# Patient Record
Sex: Female | Born: 1955 | Race: White | Hispanic: No | Smoking: Current some day smoker
Health system: Southern US, Community
[De-identification: ages and names within clinical notes are randomized; demographics above are authoritative.]

## PROBLEM LIST (undated history)

## (undated) DIAGNOSIS — N9089 Other specified noninflammatory disorders of vulva and perineum: Secondary | ICD-10-CM

## (undated) DIAGNOSIS — F1020 Alcohol dependence, uncomplicated: Secondary | ICD-10-CM

## (undated) DIAGNOSIS — I10 Essential (primary) hypertension: Secondary | ICD-10-CM

## (undated) DIAGNOSIS — M1990 Unspecified osteoarthritis, unspecified site: Secondary | ICD-10-CM

## (undated) DIAGNOSIS — D071 Carcinoma in situ of vulva: Secondary | ICD-10-CM

## (undated) HISTORY — DX: Unspecified osteoarthritis, unspecified site: M19.90

## (undated) HISTORY — DX: Carcinoma in situ of vulva: D07.1

## (undated) HISTORY — DX: Other specified noninflammatory disorders of vulva and perineum: N90.89

## (undated) HISTORY — PX: OTHER SURGICAL HISTORY: SHX169

## (undated) HISTORY — DX: Essential (primary) hypertension: I10

## (undated) HISTORY — PX: CHOLECYSTECTOMY: SHX55

---

## 1987-10-10 HISTORY — PX: ABDOMINAL HYSTERECTOMY: SHX81

## 1997-11-30 ENCOUNTER — Ambulatory Visit (HOSPITAL_COMMUNITY): Admission: RE | Admit: 1997-11-30 | Discharge: 1997-11-30 | Payer: Self-pay | Admitting: Obstetrics & Gynecology

## 1998-02-09 ENCOUNTER — Other Ambulatory Visit: Admission: RE | Admit: 1998-02-09 | Discharge: 1998-02-09 | Payer: Self-pay | Admitting: Obstetrics & Gynecology

## 1998-03-22 ENCOUNTER — Other Ambulatory Visit: Admission: RE | Admit: 1998-03-22 | Discharge: 1998-03-22 | Payer: Self-pay | Admitting: Obstetrics & Gynecology

## 1999-02-17 ENCOUNTER — Other Ambulatory Visit: Admission: RE | Admit: 1999-02-17 | Discharge: 1999-02-17 | Payer: Self-pay | Admitting: Obstetrics & Gynecology

## 2000-04-02 ENCOUNTER — Other Ambulatory Visit: Admission: RE | Admit: 2000-04-02 | Discharge: 2000-04-02 | Payer: Self-pay | Admitting: Obstetrics & Gynecology

## 2001-03-27 ENCOUNTER — Ambulatory Visit (HOSPITAL_COMMUNITY): Admission: RE | Admit: 2001-03-27 | Discharge: 2001-03-27 | Payer: Self-pay | Admitting: Gastroenterology

## 2001-07-04 ENCOUNTER — Other Ambulatory Visit: Admission: RE | Admit: 2001-07-04 | Discharge: 2001-07-04 | Payer: Self-pay | Admitting: Obstetrics & Gynecology

## 2002-01-17 ENCOUNTER — Ambulatory Visit (HOSPITAL_COMMUNITY): Admission: RE | Admit: 2002-01-17 | Discharge: 2002-01-17 | Payer: Self-pay | Admitting: Urology

## 2002-01-17 ENCOUNTER — Encounter: Payer: Self-pay | Admitting: Urology

## 2002-01-27 ENCOUNTER — Encounter: Payer: Self-pay | Admitting: Urology

## 2002-01-27 ENCOUNTER — Ambulatory Visit (HOSPITAL_COMMUNITY): Admission: RE | Admit: 2002-01-27 | Discharge: 2002-01-27 | Payer: Self-pay | Admitting: Urology

## 2002-08-27 ENCOUNTER — Other Ambulatory Visit: Admission: RE | Admit: 2002-08-27 | Discharge: 2002-08-27 | Payer: Self-pay | Admitting: Obstetrics & Gynecology

## 2003-06-29 ENCOUNTER — Ambulatory Visit (HOSPITAL_COMMUNITY): Admission: RE | Admit: 2003-06-29 | Discharge: 2003-06-29 | Payer: Self-pay | Admitting: Pediatrics

## 2003-06-29 ENCOUNTER — Encounter: Payer: Self-pay | Admitting: Pediatrics

## 2003-09-15 ENCOUNTER — Other Ambulatory Visit: Admission: RE | Admit: 2003-09-15 | Discharge: 2003-09-15 | Payer: Self-pay | Admitting: Obstetrics & Gynecology

## 2004-10-14 ENCOUNTER — Other Ambulatory Visit: Admission: RE | Admit: 2004-10-14 | Discharge: 2004-10-14 | Payer: Self-pay | Admitting: Obstetrics & Gynecology

## 2005-11-16 ENCOUNTER — Other Ambulatory Visit: Admission: RE | Admit: 2005-11-16 | Discharge: 2005-11-16 | Payer: Self-pay | Admitting: Obstetrics & Gynecology

## 2006-02-16 ENCOUNTER — Ambulatory Visit (HOSPITAL_COMMUNITY): Admission: RE | Admit: 2006-02-16 | Discharge: 2006-02-16 | Payer: Self-pay | Admitting: Pediatrics

## 2006-11-29 ENCOUNTER — Ambulatory Visit (HOSPITAL_COMMUNITY): Admission: RE | Admit: 2006-11-29 | Discharge: 2006-11-29 | Payer: Self-pay | Admitting: Pediatrics

## 2007-05-08 IMAGING — CR DG CHEST 2V
2 series · 2 of 2 positions shown · non-contrast
Comparison: none

CLINICAL DATA: Cough, short of breath, smoking history.
 CHEST ? 2 VIEW:

[view not recorded (1 of 2)]
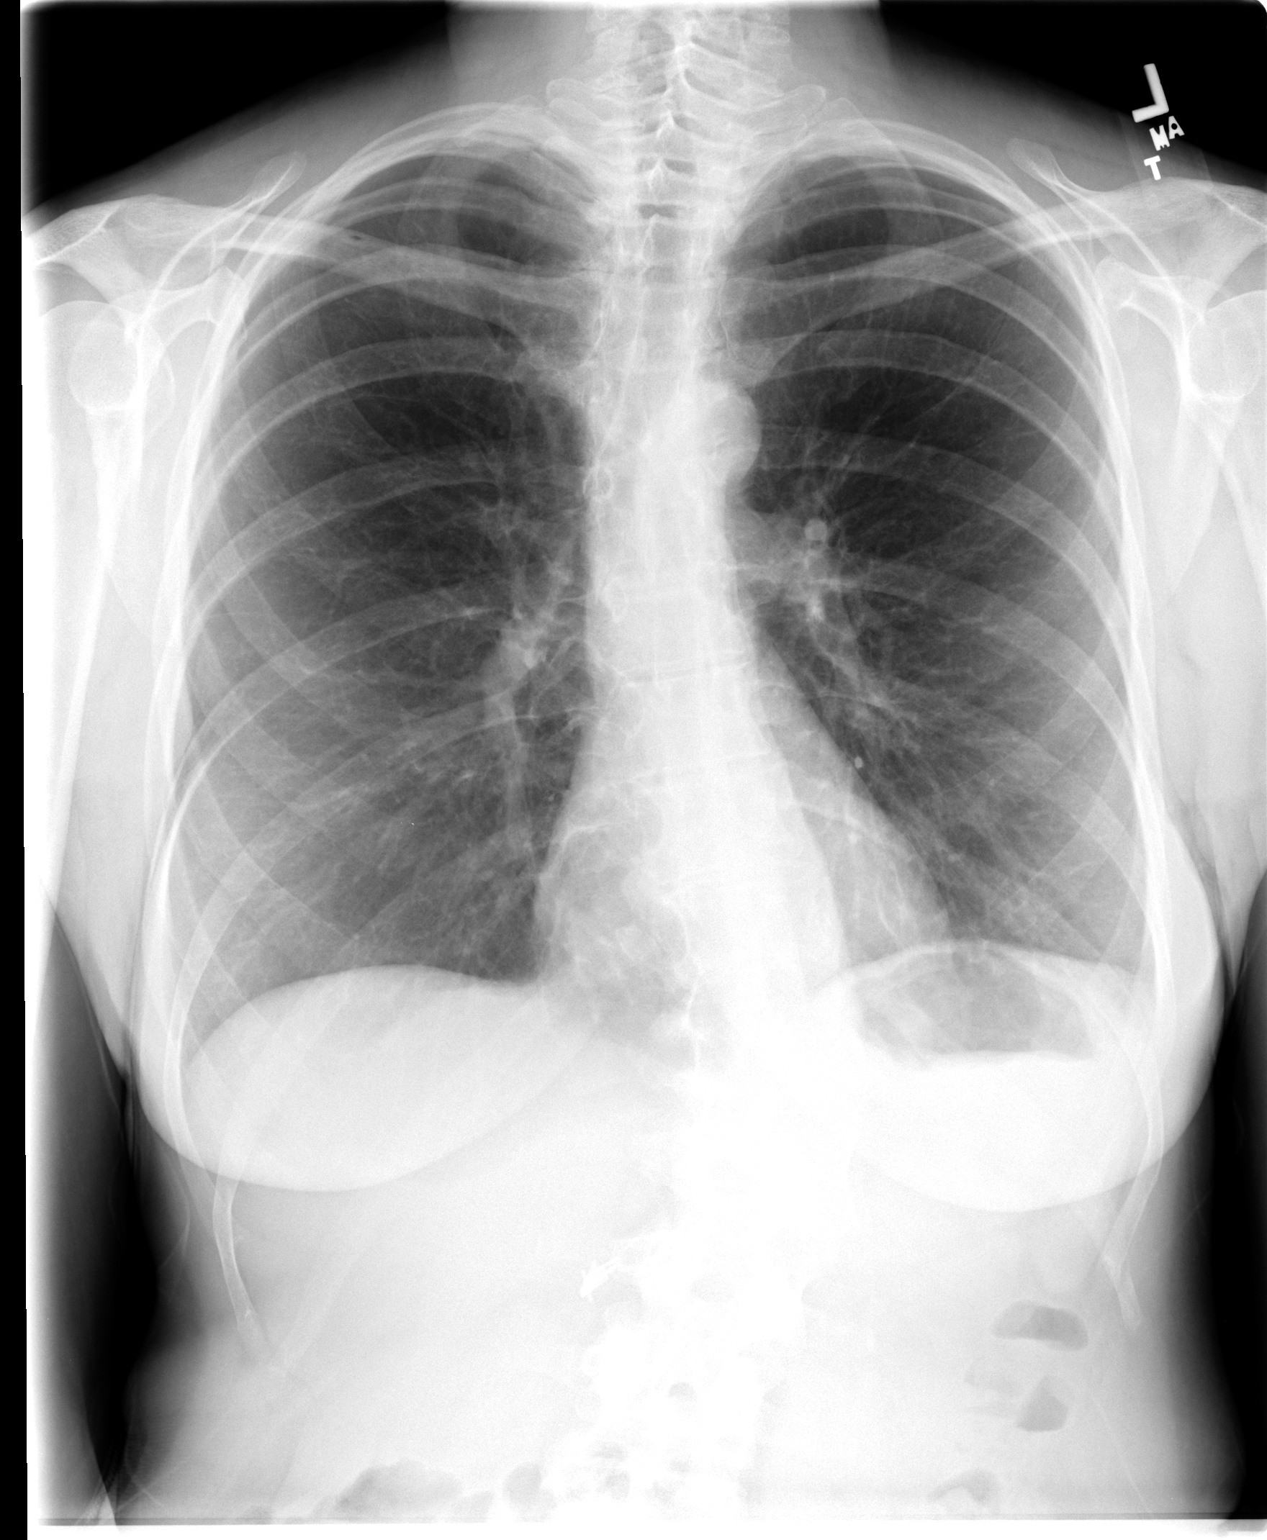

[view not recorded (2 of 2)]
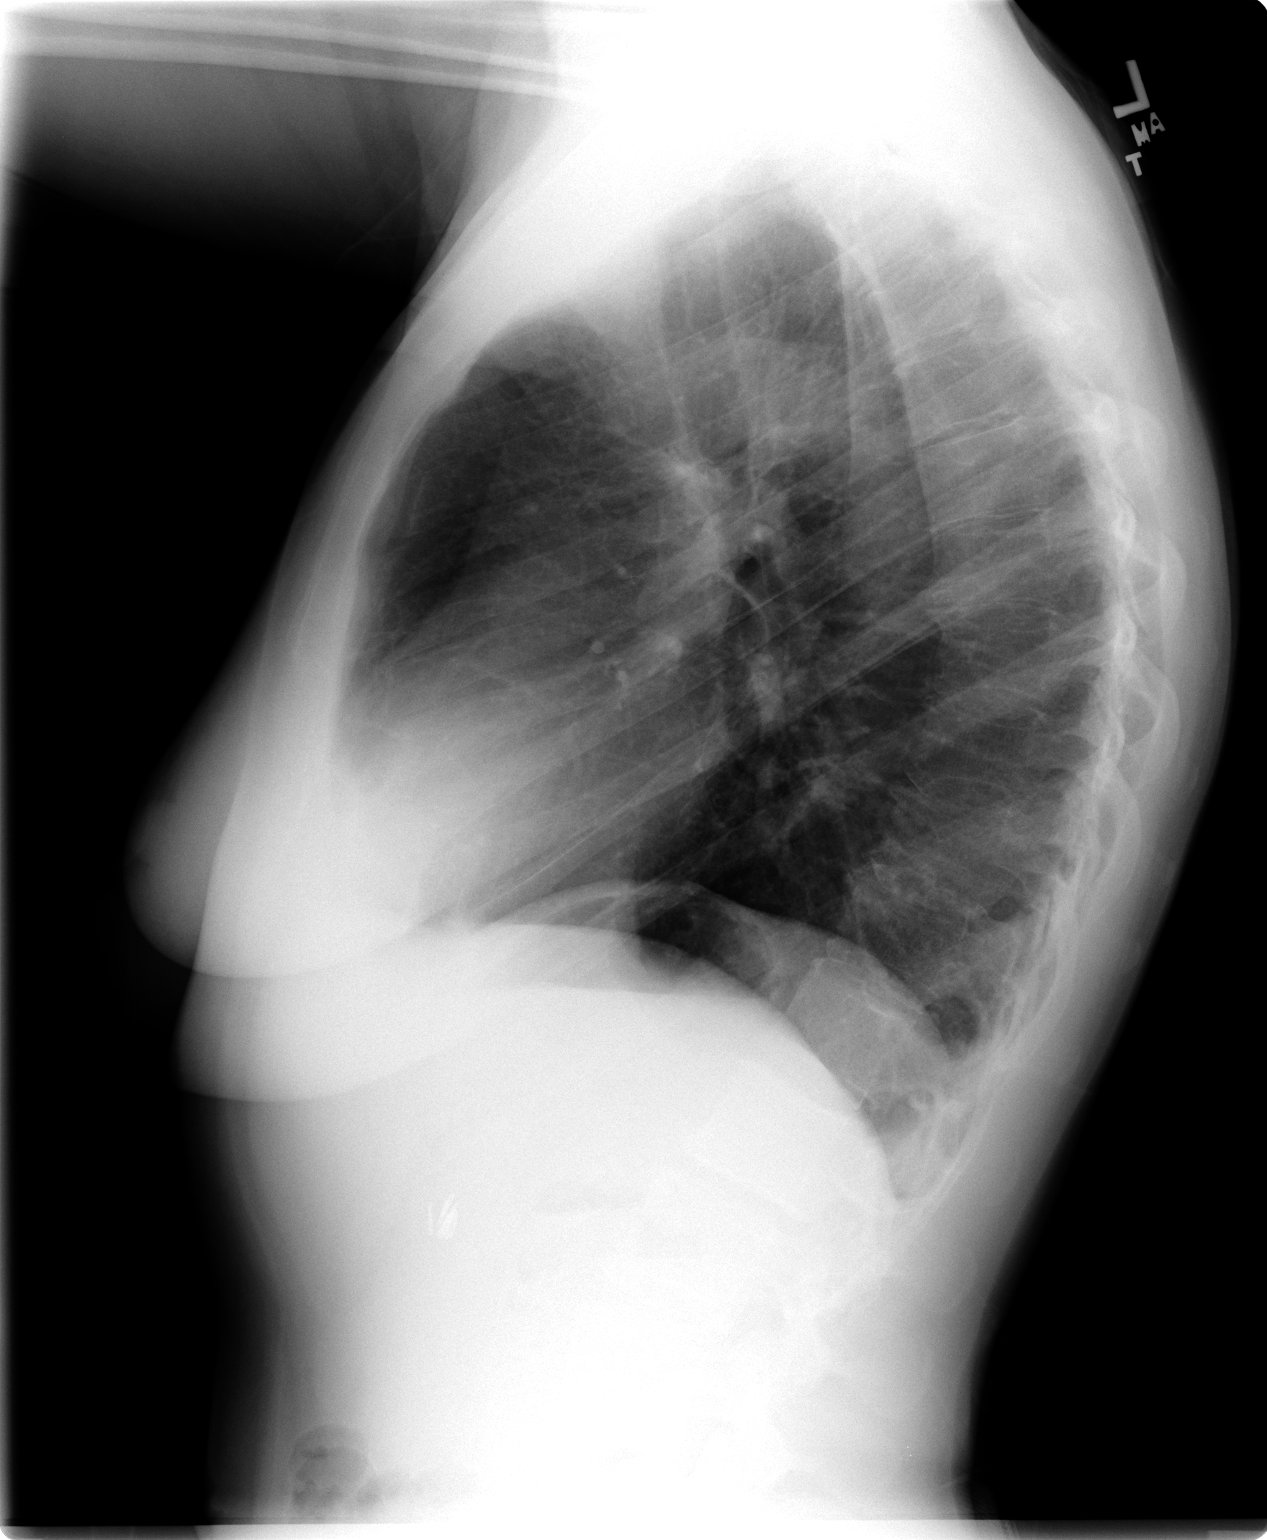

[2 of 2 positions shown; findings below may reference images not displayed]

FINDINGS: Two views of the chest show the lungs to be hyperaerated consistent with COPD.  No active infiltrate or effusion is seen.  The heart is within normal limits in size.  There is thoracolumbar scoliosis present.
IMPRESSION: COPD.  Thoracolumbar scoliosis.  No active lung disease.

## 2008-11-24 ENCOUNTER — Ambulatory Visit: Payer: Self-pay | Admitting: Gastroenterology

## 2008-11-30 ENCOUNTER — Ambulatory Visit: Payer: Self-pay | Admitting: Gastroenterology

## 2008-11-30 ENCOUNTER — Ambulatory Visit (HOSPITAL_COMMUNITY): Admission: RE | Admit: 2008-11-30 | Discharge: 2008-11-30 | Payer: Self-pay | Admitting: Gastroenterology

## 2008-11-30 ENCOUNTER — Encounter: Payer: Self-pay | Admitting: Gastroenterology

## 2008-12-01 ENCOUNTER — Telehealth (INDEPENDENT_AMBULATORY_CARE_PROVIDER_SITE_OTHER): Payer: Self-pay

## 2008-12-11 ENCOUNTER — Encounter: Payer: Self-pay | Admitting: Gastroenterology

## 2009-01-04 DIAGNOSIS — C439 Malignant melanoma of skin, unspecified: Secondary | ICD-10-CM | POA: Insufficient documentation

## 2009-01-04 DIAGNOSIS — F329 Major depressive disorder, single episode, unspecified: Secondary | ICD-10-CM

## 2009-01-04 DIAGNOSIS — Z8719 Personal history of other diseases of the digestive system: Secondary | ICD-10-CM

## 2009-01-04 DIAGNOSIS — F411 Generalized anxiety disorder: Secondary | ICD-10-CM | POA: Insufficient documentation

## 2009-01-04 DIAGNOSIS — K219 Gastro-esophageal reflux disease without esophagitis: Secondary | ICD-10-CM

## 2009-01-04 DIAGNOSIS — F17200 Nicotine dependence, unspecified, uncomplicated: Secondary | ICD-10-CM | POA: Insufficient documentation

## 2009-01-04 DIAGNOSIS — M810 Age-related osteoporosis without current pathological fracture: Secondary | ICD-10-CM | POA: Insufficient documentation

## 2009-01-04 DIAGNOSIS — M129 Arthropathy, unspecified: Secondary | ICD-10-CM | POA: Insufficient documentation

## 2009-01-04 DIAGNOSIS — I10 Essential (primary) hypertension: Secondary | ICD-10-CM | POA: Insufficient documentation

## 2009-01-05 ENCOUNTER — Ambulatory Visit: Payer: Self-pay | Admitting: Gastroenterology

## 2009-01-05 DIAGNOSIS — K222 Esophageal obstruction: Secondary | ICD-10-CM

## 2009-01-05 DIAGNOSIS — R110 Nausea: Secondary | ICD-10-CM

## 2009-01-05 DIAGNOSIS — R1013 Epigastric pain: Secondary | ICD-10-CM

## 2009-01-05 DIAGNOSIS — K5909 Other constipation: Secondary | ICD-10-CM

## 2009-02-25 ENCOUNTER — Ambulatory Visit (HOSPITAL_COMMUNITY): Admission: RE | Admit: 2009-02-25 | Discharge: 2009-02-25 | Payer: Self-pay | Admitting: Specialist

## 2009-03-22 ENCOUNTER — Ambulatory Visit (HOSPITAL_COMMUNITY): Admission: RE | Admit: 2009-03-22 | Discharge: 2009-03-22 | Payer: Self-pay | Admitting: Pediatrics

## 2009-06-09 ENCOUNTER — Ambulatory Visit: Admission: RE | Admit: 2009-06-09 | Discharge: 2009-06-09 | Payer: Self-pay | Admitting: Gynecologic Oncology

## 2009-07-13 ENCOUNTER — Ambulatory Visit (HOSPITAL_COMMUNITY): Admission: RE | Admit: 2009-07-13 | Discharge: 2009-07-13 | Payer: Self-pay | Admitting: Gynecologic Oncology

## 2009-08-25 ENCOUNTER — Ambulatory Visit: Admission: RE | Admit: 2009-08-25 | Discharge: 2009-08-25 | Payer: Self-pay | Admitting: Gynecologic Oncology

## 2009-11-24 ENCOUNTER — Ambulatory Visit: Admission: RE | Admit: 2009-11-24 | Discharge: 2009-11-24 | Payer: Self-pay | Admitting: Gynecologic Oncology

## 2010-06-22 ENCOUNTER — Ambulatory Visit: Admission: RE | Admit: 2010-06-22 | Discharge: 2010-06-22 | Payer: Self-pay | Admitting: Gynecologic Oncology

## 2010-11-02 ENCOUNTER — Ambulatory Visit
Admission: RE | Admit: 2010-11-02 | Discharge: 2010-11-02 | Payer: Self-pay | Source: Home / Self Care | Attending: Gynecologic Oncology | Admitting: Gynecologic Oncology

## 2010-11-07 NOTE — Consult Note (Signed)
NAME:  Carol Rowe, Carol Rowe NO.:  [id number]  MEDICAL RECORD NO.:  [id number]          PATIENT TYPE:  OUT  LOCATION:  GYN                          FACILITY:  [facility]  PHYSICIAN:  Paola A. 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  There was a slightly pale area near the posterior fourchette that we put acetic acid on it, it did not light up. She was dispositioned to close followup.  She was seen by Dr. Jennette Kettle in December and an area was noted.  She underwent a wide local excision of the vulva in the office on December 22.  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  She comes in today for followup.  She is otherwise doing quite well and denies any complaints.  PHYSICAL EXAMINATION:  VITAL SIGNS:  Weight 126 pounds, blood pressure 118/60, pulse 78. GENERAL:  Well-nourished, well-developed, very pleasant female, in no acute distress. PELVIC:  External genitalia in the right labia majora inferiorly, there is evidence of a suture site.  There is approximately a 5 x 5 cm area of white hyperkeratotic lesion that was noted.  After discussion, the patient would like for [place] to excise that area today rather than having to do something in the operating room at a later date.  After obtaining verbal informed consent from the patient, the area was cleansed with Betadine cleaning solution.  A 1 cc of 1% lidocaine was injected into the area.  Using 2 passes of incisional biopsy forceps, the area was removed in its entirety.  The 2 sutures of 4-0 Vicryl on an SH needle were used to close the area.  She tolerated the procedure well.  There was no active bleeding.  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  PLAN:  I will followup results of these biopsies from today and I will call her with the results.  She will return to see [place] in 3 months for surveillance but she will see me in 2 weeks for a brief postop check.     Paola A. Duard Brady, MD     PAG/MEDQ  D:  11/02/2010  T:  11/02/2010  Job:  [id number]  cc:   Freddy Finner, M.D. Fax: [phone]  Telford Nab, R.N. [address] [place], [place] [address]  Francoise Schaumann. Halm, DO, FAAP Fax: [phone]  A. Jeralene Peters., M.D. Fax: [phone]  Electronically Signed by Cleda Mccreedy MD on 11/07/2010 02:24:16 PM

## 2010-11-16 ENCOUNTER — Ambulatory Visit: Payer: BC Managed Care – PPO | Attending: Gynecologic Oncology | Admitting: Gynecologic Oncology

## 2010-11-16 DIAGNOSIS — D071 Carcinoma in situ of vulva: Secondary | ICD-10-CM | POA: Insufficient documentation

## 2010-11-18 NOTE — Consult Note (Signed)
NAME:  Carol Rowe, EHMKE NO.:  [id number]  MEDICAL RECORD NO.:  [id number]          PATIENT TYPE:  OUT  LOCATION:  GYN                          FACILITY:  [facility]  PHYSICIAN:  Jersey Espinoza A. Duard Brady, MD    DATE OF BIRTH:  09-27-1956  DATE OF CONSULTATION:  11/16/2010 DATE OF DISCHARGE:  11/02/2010                                CONSULTATION   HISTORY OF PRESENT ILLNESS:  Ms. Carol Rowe is a 55 year old who I saw January 25.  She had a wide local excision of the vulva in the office by Dr. Jennette Kettle on January 25 that revealed VIN III with a positive lateral margin.  When I saw her January 25, there was a 5 x 5 mm area of white hyperkeratotic lesion that was noted.  She wanted the area resected in the office.  This was done without difficulty.  It revealed VIN III with positive margin.  She comes in today for a postoperative check.  She did quite well.  She did require the Percocet for pain control.  She was recently diagnosed with bronchitis and is currently on cephalexin.  She is otherwise doing well.  PHYSICAL EXAMINATION:  VITAL SIGNS:  Weight 124 pounds, blood pressure 118/70, height 5 feet 2, pulse 64, respirations 16, temperature 97.8. GENERAL:  A well-nourished, well-developed female in no acute distress. PELVIC:  External genitalia shows a well-healed surgical site.  The sutures have fallen out.  There is no residual hyperkeratosis.  ASSESSMENT:  A 55 year old with vulval intraepithelial neoplasia III.  PLAN:  Return to see [place] in 3 months for surveillance.     Kenlee Vogt A. Duard Brady, MD     PAG/MEDQ  D:  11/16/2010  T:  11/16/2010  Job:  [id number]  cc:   Telford Nab, R.N. [address] [place], [place] [address]  W. Varney Baas, M.D. Fax: [phone]  Francoise Schaumann. Benjamine Mola, FAAP Fax: [phone]  Electronically Signed by Cleda Mccreedy MD on 11/18/2010 10:03:23 AM

## 2011-01-05 ENCOUNTER — Ambulatory Visit (INDEPENDENT_AMBULATORY_CARE_PROVIDER_SITE_OTHER): Payer: BC Managed Care – PPO | Admitting: Otolaryngology

## 2011-01-12 ENCOUNTER — Ambulatory Visit (INDEPENDENT_AMBULATORY_CARE_PROVIDER_SITE_OTHER): Payer: BC Managed Care – PPO | Admitting: Otolaryngology

## 2011-01-12 DIAGNOSIS — J343 Hypertrophy of nasal turbinates: Secondary | ICD-10-CM

## 2011-01-12 DIAGNOSIS — J310 Chronic rhinitis: Secondary | ICD-10-CM

## 2011-01-12 LAB — COMPREHENSIVE METABOLIC PANEL
ALT: 18 U/L (ref 0–35)
Albumin: 3.7 g/dL (ref 3.5–5.2)
Alkaline Phosphatase: 83 U/L (ref 39–117)
Chloride: 105 mEq/L (ref 96–112)
Glucose, Bld: 107 mg/dL — ABNORMAL HIGH (ref 70–99)
Potassium: 3.1 mEq/L — ABNORMAL LOW (ref 3.5–5.1)
Sodium: 137 mEq/L (ref 135–145)
Total Bilirubin: 0.4 mg/dL (ref 0.3–1.2)
Total Protein: 6.2 g/dL (ref 6.0–8.3)

## 2011-01-12 LAB — CBC
Hemoglobin: 13.7 g/dL (ref 12.0–15.0)
Platelets: 329 10*3/uL (ref 150–400)
RDW: 12 % (ref 11.5–15.5)
WBC: 12.3 10*3/uL — ABNORMAL HIGH (ref 4.0–10.5)

## 2011-02-21 NOTE — Op Note (Signed)
NAME:  VALENDA, MCLEARY               ACCOUNT NO.:  [id number]   MEDICAL RECORD NO.:  [id number]          PATIENT TYPE:  AMB   LOCATION:  DAY                           FACILITY:  [facility]   PHYSICIAN:  Kassie Mends, M.D.      DATE OF BIRTH:  02-Nov-1955   DATE OF PROCEDURE:  11/30/2008  DATE OF DISCHARGE:                                PROCEDURE NOTE   REFERRING PHYSICIAN:  Francoise Schaumann. Halm, DO, FAAP   PROCEDURE:  1. Colonoscopy.  2. Esophagogastroduodenoscopy with cold forceps biopsy and Savary      dilation to 17 mm.   INDICATION FOR EXAM:  Ms. Hade is a 55 year old female who presented  with abdominal pain and dysphagia. Her father had colon cancer at age  less than 52.   FINDINGS:  1. Tortuous colon.  The patient required abdominal pressure in the      supine position in order to successfully intubate the cecum.  2. Pancolonic diverticulosis, most pronounced in the left colon.  3. Moderate internal hemorrhoids.  Otherwise, no evidence of polyps,      masses, inflammatory change or AVMs.  4. Distal Schatzki ring.  Otherwise no evidence of erosions,      ulceration, Barrett's or mass in the esophagus.  The distal      esophagus was dilated to 17 mm.  A 17-mm dilator passed with      moderate resistance.  5. Patchy erythema in the antrum.  Biopsies obtained via cold forceps      to evaluate for H. pylori gastritis or eosinophilic gastritis.  6. Normal duodenal bulb in the second portion of the duodenum.   DIAGNOSES:  1. Mild-to-moderate diverticulosis.  2. Moderate internal hemorrhoids.  3. Schatzki ring, dilated.  4. Mild gastritis.   RECOMMENDATIONS:  1. Will call Ms. Scheller with the results of her biopsies.  She has a      follow up appointment on January 05, 2009.  2. She should continue the Pepcid at nighttime and add Prilosec once      daily 30 minutes prior to her first meal.  3. She should follow a high-fiber diet.  She is given a handout on      high-fiber diet,  diverticulosis, and hemorrhoids.  4. Screening colonoscopy in 5 years.  5. No aspirin, NSAIDs, or anticoagulation for 7 days.   MEDICATIONS:  1. Demerol 100 mg IV.  2. Versed 7 mg IV.   PROCEDURE TECHNIQUE:  Physical exam was performed.  Informed consent was  obtained with the patient after explaining the benefits, risks, and  alternatives to the procedure.  The patient was connected to the monitor  and placed in the left lateral position.  Continuous oxygen was provided  by nasal cannula and IV medicine administered through an indwelling  cannula.  After administration of sedation and rectal exam, the  patient's rectum was intubated and the scope was advanced under direct  visualization to the cecum.  The scope was removed slowly by carefully  examining the color, texture, anatomy, and integrity of the mucosa on  the way out.  After the colonoscopy, the patient's esophagus was intubated with a  diagnostic gastroscope and the scope was advanced under direct  visualization to the second portion of the duodenum.  The scope was  removed slowly by carefully examining the color, texture, anatomy, and  integrity of the mucosa on the way out.  Prior to withdrawal of the  scope, the retroflex view was performed and the Savary guidewire was  introduced.  Each dilator was introduced over the wire from the 14 mm to  the 17 mm.  The wire and the dilator were removed.  The patient was  recovered in Endoscopy and discharged home in satisfactory condition.   ADDENDUM 16109:  Pt with sore throat following procedure. Sx improved  after Dukes MW. Viscous Lidocaine caused nause. Pepcid BID. Pt states  Prilosec doesn't help. OPV with SLM in one month.      Kassie Mends, M.D.  Electronically Signed     SM/MEDQ  D:  11/30/2008  T:  11/30/2008  Job:  [id number]   cc:   Francoise Schaumann. Milford Cage DO, FAAP  Fax: [phone]

## 2011-02-21 NOTE — Consult Note (Signed)
NAME:  Carol Rowe, Carol Rowe               ACCOUNT NO.:  [id number]   MEDICAL RECORD NO.:  [id number]          PATIENT TYPE:  AMB   LOCATION:  DAY                           FACILITY:  [facility]   PHYSICIAN:  Kassie Mends, M.D.      DATE OF BIRTH:  1956-04-01   DATE OF CONSULTATION:  11/24/2008  DATE OF DISCHARGE:                                 CONSULTATION   PHYSICIAN REQUESTING CONSULTATION:  Francoise Schaumann. Halm, DO, FAAP   PHYSICIAN CONSULTING NOTE:  Kassie Mends, MD   REASON FOR CONSULTATION:  Abdominal pain, acid reflux, and dysphagia.   HISTORY OF PRESENT ILLNESS:  The patient is a pleasant 55 year old  Caucasian female who presents today for further evaluation of epigastric  pain, problem swallowing, and GERD.  She states back in November she  started having problems with epigastric pain.  Symptoms are  postprandially.  Did not seem to matter what she was eating, however.  Some days she had so much difficulty eating, she could only take a  little bit of peanut butter to take her pills.  She would still have  abdominal pain.  Several weeks after these symptoms, she started having  problems with sensation of pills getting stuck when she would swallow  them.  She never had to throw any pills up.  She really did not have  much problem getting her solid food down.  She complains of acid  regurgitation.  She has lot of heartburn.  In January, she saw Dr. Milford Cage  for these symptoms.  She has also noted single episode of black tarry  stool.  She denied any Pepto-Bismol or iron use around the time of that  occurrence.  She also has intermittent small volume of blood in the  toilet bowl after her bowel movement.  She still complains of chronic  constipation.  She tells me if she does not have a bowel movement by the  second day, she will take her herbal laxative.  She had a 10-pound  weight loss in the last 2 months.  She was tried on Prilosec 40 mg  daily.  She states it did not seem to help.  Currently,  she is on Pepcid  40 mg nightly with minimal relief.   CURRENT MEDICATIONS:  1. Altace 2.5 mg daily.  2. Lasix 40 mg daily.  3. Pepcid 40 mg nightly.  4. Xanax 0.5 mg nightly.  5. Calcium 500 mg with vitamin D daily.  6. Multivitamin daily.  7. Vitamin C daily.  8. Vitamin E daily.  9. Cranberry daily.   ALLERGIES:  She listed as no known drug allergies, but Dr. Webb Laws report  listed NITROFURANTOIN causes rash and REMERON causes swelling.   PAST MEDICAL HISTORY:  1. Hypertension.  2. GERD.  3. Anxiety.  4. Depression.  5. Arthritis.  6. Osteoporosis.  7. History of diverticulitis.  8. History of melanoma in 1988 with resection.  9. Colonoscopy by Dr. Katrinka Blazing in [place] in 1995 initially with one      around in year 2000.  Given the family history of colon cancer in  father less than age 94.  She denies having any polyps.  She states      she is overdue for colonoscopy now.  She has had a hysterectomy,      cholecystectomy as well.   FAMILY HISTORY:  Her mother is 26 years old.  She has had breast cancer.  She has had history of polyps.  Father succumbed to colon cancer in his  late 37s.  Sister has history of colon polyps and diverticulitis.  She  has a brother with history of ulcerative colitis since age 91.  Another  brother with heart failure.   SOCIAL HISTORY:  She is separated.  She has a son.  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  She smokes half pack of cigarettes daily.  Denies any alcohol use.   REVIEW OF SYSTEMS:  GI:  See HPI.  CONSTITUTIONAL:  See HPI.  CARDIOPULMONARY:  She denies recurrent shortness of breath, chest pain,  palpitations, or cough.  GENITOURINARY:  She denies any dysuria or  hematuria.   PHYSICAL EXAMINATION:  VITAL SIGNS:  Weight 118, height 5 feet 3 inches,  temperature 98.4, blood pressure 100/70, and pulse 80.  GENERAL:  Pleasant, well-nourished, well-developed Caucasian female in  no acute distress.  SKIN:  Warm and  dry.  No jaundice.  HEENT:  Sclerae nonicteric.  Oropharyngeal mucosa moist and pink.  No  lesions, erythema, or exudate.  No lymphadenopathy, thyromegaly.  CHEST:  Lungs are clear to auscultation.  CARDIAC:  Reveals regular rate and rhythm.  Normal S1 and S2, no  murmurs, rubs, or gallops.  ABDOMEN:  Positive bowel sounds.  Abdomen soft, nondistended.  She has  mild epigastric tenderness to deep palpation.  She also has some mild  left lower quadrant tenderness to deep palpation.  No rebound or  guarding.  No organomegaly or masses.  No abdominal bruits or hernias.  LOWER EXTREMITIES:  No edema.   LABORATORY DATA:  Labs from October 20, 2008, white blood cell count  9500, hemoglobin 14.8, and platelets 358,000.  Total bilirubin 0.5,  alkaline phosphatase 94, SGOT 15, SGPT 10, albumin 4.4, amylase 63.  H.  pylori serology is negative.   IMPRESSION:  Ms. Hamed is a 55 year old lady with several-month  history of epigastric pain, worse postprandially.  She has also had  refractory gastroesophageal reflux disease and dysphagia to pills.  She  may have esophagitis or esophageal stricture.  Cannot rule out gastritis  or PUD.  She also has chronic constipation with intermittent  hematochezia with a family history of colon cancer in father less than  age 62 who succumbed to the disease.  She is overdue for her colonoscopy  and would like to pursue one at this time.   PLAN:  1. Colonoscopy and EGD with Dr. Cira Servant in the near future.  2. She will continue Pepcid 40 mg daily for now.  She will likely need      a PPI, but previously filled Prilosec 40 mg      daily for 1 month.  3. Further recommendations to follow.   I would like to thank Dr. Vivia Ewing for allowing [place] to take part in  the care of this patient.      Tana Coast, P.A.      Kassie Mends, M.D.  Electronically Signed    LL/MEDQ  D:  11/24/2008  T:  11/25/2008  Job:  [id number]   cc:   Francoise Schaumann. Halm, DO, FAAP  Fax:  [phone]   Kassie Mends, M.D.  [address]  [place] , [place] [address]

## 2011-02-22 ENCOUNTER — Ambulatory Visit: Payer: BC Managed Care – PPO | Attending: Gynecologic Oncology | Admitting: Gynecologic Oncology

## 2011-02-22 DIAGNOSIS — D071 Carcinoma in situ of vulva: Secondary | ICD-10-CM | POA: Insufficient documentation

## 2011-02-23 NOTE — Consult Note (Signed)
NAME:  Carol Rowe, Carol Rowe               ACCOUNT NO.:  [id number]  MEDICAL RECORD NO.:  [id number]           PATIENT TYPE:  O  LOCATION:  GYN                          FACILITY:  [facility]  PHYSICIAN:  Dontarious Schaum A. Duard Brady, MD    DATE OF BIRTH:  05/30/1956  DATE OF CONSULTATION:  02/22/2011 DATE OF DISCHARGE:                                CONSULTATION   Carol Rowe is a very pleasant 55 year-old who underwent wide local excision of the vulva on January 25 by Carol Rowe that revealed VIN III with positive lateral margins.  When I saw her in January, there was an area of white hyperkeratotic lesion that was also noted, and she wanted the area resected.  It was done in the office without difficulty, revealed VIN III with a positive margin.  She has been followed since that time with no evidence of recurrence.  She comes in today after last being seen in January.  In the interim, she has seen Carol Rowe and was told also that there was no lesion noted at that time.  Her osteoporosis has gotten worse, and she has lost "another inch." Unfortunately, since we last saw her, her husband passed away after a 7-week illness in the hospital.  It is a little unclear exactly what happened, but he developed an abscess, had complications with regards to MRSA and what appears to be pulmonary and respiratory failure.  He passed away in 01/31/2023.  She is otherwise doing fairly well.  She is quite concerned about her son who is not dealing with his father's death very well.  He is under the care and guidance of a psychologist.  Carol Rowe herself has not noticed any new lesions on the vulva and has no complaints.  PHYSICAL EXAMINATION:  VITAL SIGNS:  Weight 128 pounds which is up 4 pounds from her last visit, blood pressure 124/60, pulse 72. GENERAL:  Well-nourished, well-developed female in no acute distress. LYMPHATIC:  Groins are negative for adenopathy. GU:  External genitalia, surgical site is noted.  There are no  visible lesions.  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  PLAN:  She has an appointment to follow up with Carol Rowe in 4 months. At this time, she can be released from our clinic.  She knows I will be happy to see her in the future should the need arise and should any lesions develop.     Suzanne Kho A. Duard Brady, MD     PAG/MEDQ  D:  02/22/2011  T:  02/22/2011  Job:  [id number]  cc:   Telford Nab, R.N. [address] [place], [place] [address]  W. Varney Baas, M.D. Fax: [phone]  Francoise Schaumann. Raynelle Highland Fax: [phone]  Electronically Signed by Cleda Mccreedy MD on 02/23/2011 01:52:57 PM

## 2011-02-24 NOTE — Procedures (Signed)
[facility]  Patient:    Carol Rowe, Carol Rowe                      MRN: [id number] Proc. Date: 03/27/01 Adm. Date:  [id number] Attending:  Louie Bun CC:         Reymundo Poll Dub Mikes, M.D.   Procedure Report  PROCEDURE:  Colonoscopy.  SURGEON:  John C. Madilyn Fireman, M.D.  INDICATIONS FOR PROCEDURE:  Family history of colon cancer in a first degree relative.  DESCRIPTION OF PROCEDURE:  The patient was placed in the left lateral decubitus position and placed on the pulse monitor with continuous low flow oxygen delivered by nasal cannula.  She was sedated with 120 mg of IV Demerol and 12 mg of IV Versed.  The Olympus video colonoscope was inserted into the rectum and advanced to the cecum, confirmed by transillumination at McBurneys point and visualization of the ileocecal valve and appendiceal orifice.  The prep was excellent.  The cecum, ascending, transverse, descending, and sigmoid colon all appeared normal with no masses, polyps, diverticula, or other mucosal abnormalities.  The rectum likewise appeared normal.  On retroflexed view, the anus did reveal some small internal hemorrhoids.  The colonoscope was then withdrawn and the patient returned to the recovery room in stable condition.  She tolerated the procedure well and there were no immediate complications.  IMPRESSION:  Internal hemorrhoids, otherwise normal colonoscopy.  PLAN:  Repeat colonoscopy in five years based on her family history. DD:  03/27/01 TD:  03/28/01 Job: [id number]

## 2011-11-22 ENCOUNTER — Ambulatory Visit (HOSPITAL_COMMUNITY)
Admission: RE | Admit: 2011-11-22 | Discharge: 2011-11-22 | Disposition: A | Discharge status: Home / Self Care | Payer: BC Managed Care – PPO | Source: Ambulatory Visit | Attending: Pediatrics | Admitting: Pediatrics

## 2011-11-22 ENCOUNTER — Other Ambulatory Visit (HOSPITAL_COMMUNITY): Payer: Self-pay | Admitting: Pediatrics

## 2011-11-22 DIAGNOSIS — R059 Cough, unspecified: Secondary | ICD-10-CM | POA: Insufficient documentation

## 2011-11-22 DIAGNOSIS — R071 Chest pain on breathing: Secondary | ICD-10-CM | POA: Insufficient documentation

## 2011-11-22 DIAGNOSIS — R05 Cough: Secondary | ICD-10-CM

## 2012-07-11 ENCOUNTER — Ambulatory Visit: Payer: BC Managed Care – PPO | Attending: Gynecologic Oncology | Admitting: Gynecologic Oncology

## 2012-07-11 ENCOUNTER — Encounter: Payer: Self-pay | Admitting: Gynecologic Oncology

## 2012-07-11 VITALS — BP 112/62 | HR 64 | Temp 98.9°F | Resp 22 | Ht 62.36 in | Wt 125.0 lb

## 2012-07-11 DIAGNOSIS — N9089 Other specified noninflammatory disorders of vulva and perineum: Secondary | ICD-10-CM

## 2012-07-11 DIAGNOSIS — D071 Carcinoma in situ of vulva: Secondary | ICD-10-CM | POA: Insufficient documentation

## 2012-07-11 NOTE — Patient Instructions (Signed)
Return to clinic for excision of vulvar lesion.

## 2012-07-11 NOTE — Progress Notes (Signed)
Consult Note: Gyn-Onc  Carol Rowe 56 y.o. female  CC:  Chief Complaint  Patient presents with  . Vulvar lesion    Last seen in 2012    HPI: Carol Rowe is a very pleasant 56 year-old who underwent wide local excision of the vulva that revealed VIN III in 03/2009 with positive lateral margins. We initially saw her in September of 2010 for evaluation of this and continue to follow her through may of 2012. Most recently Carol Rowe had undergone vulvar biopsies and endometrial ablations. Last pathology I have is from January 2012 at which time Carol Rowe had a right vulvar biopsy that revealed VIN-III with positive edges. When I last saw her in May of 2012 there is no evidence of recurrence. Carol Rowe was recently seen by Dr. Jennette Kettle for which time there was a white area on the vulva that was noted in Carol Rowe's been referred back to [place] for evaluation of that.  Interval History:  Carol Rowe has been doing well Carol Rowe had shingles since we last saw her Carol Rowe was also having some significant left abdominal and pelvic pain. Carol Rowe had ultrasounds done by Dr. Jennette Kettle that showed a 7 mm cyst in August in the area of the left adnexa a repeat in September showed a 4 x 4 mm mass with hyperecoic borders most likely consistent with endometriosis. Carol Rowe denies any itching burning or bleeding and the vulva Carol Rowe has no vulvar discharge. Carol Rowe is trying to quit smoking and is using Nicorette. Carol Rowe has significant stressors in the family Carol Rowe's happy care for her mother who has glaucoma and macular degeneration. Her brother recently hospitalized for a fairly significant facial infection and is seeing a nose and throat specialist. Her son is doing fairly well.  Review of Systems: As above  Current Meds:  Outpatient Encounter Prescriptions as of 07/11/2012  Medication Sig Dispense Refill  . Acetaminophen (TYLENOL PO) Take by mouth as needed.      . ALPRAZolam (XANAX PO) Take by mouth at bedtime.      . Estradiol (VIVELLE-DOT TD) Place onto the skin once a week.       . furosemide (LASIX) 40 MG tablet Take 40 mg by mouth daily.      . Gabapentin (NEURONTIN PO) Take by mouth as needed.      Marland Kitchen HYDROCODONE-ACETAMINOPHEN PO Take by mouth as needed.      . Ramipril (ALTACE PO) Take by mouth daily.        Allergy:  Allergies  Allergen Reactions  . Macrobid (Nitrofurantoin Macrocrystal) Hives  . Mirtazapine     REACTION: causes swelling    Social Hx:   History   Social History  . Marital Status: Legally Separated    Spouse Name: N/A    Number of Children: N/A  . Years of Education: N/A   Occupational History  . Not on file.   Social History Main Topics  . Smoking status: Current Some Day Smoker -- 0.3 packs/day    Types: Cigarettes  . Smokeless tobacco: Not on file  . Alcohol Use: No  . Drug Use: No  . Sexually Active: Not on file   Other Topics Concern  . Not on file   Social History Narrative  . No narrative on file    Past Surgical Hx:  Past Surgical History  Procedure Date  . Abdominal hysterectomy 1989  . Cholecystectomy   . Skin cancer removal     Past Medical Hx:  Past Medical History  Diagnosis Date  .  Vulvar lesion   . Hypertension   . Arthritis   . VIN III (vulvar intraepithelial neoplasia III)     Family Hx:  Family History  Problem Relation Age of Onset  . Breast cancer Mother   . Colon cancer Father   . Heart disease Father   . Heart disease Maternal Grandmother   . Diabetes Paternal Grandmother     Vitals:  Blood pressure 112/62, pulse 64, temperature 98.9 F (37.2 C), temperature source Oral, resp. rate 22, height 5' 2.36" (1.584 m), weight 125 lb (56.7 kg).  Physical Exam: Well-nourished well-developed female in no acute distress.  Neck: Supple, no lymphadenopathy no thyromegaly.  Lungs: Clear to auscultation bilaterally.  Cardiovascular: Regular rate and rhythm.  Abdomen: Soft nontender nondistended no palpable mass or hepatosplenomegaly.  Groins: No lymphadenopathy.  Extremities:  No edema.  Pelvic: External genitalia notable for a 1 x 1 cm raised hyperkeratotic slightly ulcerated lesion in the lower aspect of the right labia majora near the introitus. There are no other visible lesions. After obtaining the patient's verbal consent, area was injected with 1 cc of 1% lidocaine. Tischler biopsy forcep was used for the biopsied. Hemostasis was obtained with silver nitrate. Carol Rowe tolerated the procedure well. Carol Rowe was shown the lesion with the mirror.  Assessment/Plan: D56-year-old with history of VIN 3. Plan will followup in results with biopsy today we will contact her with the results. Carol Rowe would like to proceed with wide local excision in the office if we think that'll be feasible. We will schedule it accordingly. Carol Rowe was congratulated with her smoking cessation efforts and encouraged to continue them.  Oakes Mccready A., MD 07/11/2012, 1:52 PM

## 2012-07-16 ENCOUNTER — Telehealth: Payer: Self-pay | Admitting: Gynecologic Oncology

## 2012-07-16 NOTE — Telephone Encounter (Signed)
Left a message asking the patient to call the clinic to discuss her biopsy results.

## 2012-07-16 NOTE — Telephone Encounter (Signed)
Pt informed of biopsy results.  Informed that Dr. Duard Brady would be notified of the results and that the pt would be receiving a phone call in one to two days to arrange follow up in the office.  No concerns or questions voiced.

## 2012-07-17 ENCOUNTER — Telehealth: Payer: Self-pay | Admitting: Gynecologic Oncology

## 2012-07-17 NOTE — Telephone Encounter (Signed)
Called patient about upcoming office appointment with Dr. Duard Brady for a wide local incision of the vulva in the office.  Scheduled for September 12, 2012 at 1:45pm.  Pt verbalizing understanding.  Instructed to call for any concerns or questions.

## 2012-08-27 ENCOUNTER — Emergency Department (HOSPITAL_COMMUNITY)
Admission: EM | Admit: 2012-08-27 | Discharge: 2012-08-27 | Disposition: A | Discharge status: Home / Self Care | Payer: BC Managed Care – PPO | Attending: Emergency Medicine | Admitting: Emergency Medicine

## 2012-08-27 ENCOUNTER — Emergency Department (HOSPITAL_COMMUNITY): Payer: BC Managed Care – PPO

## 2012-08-27 ENCOUNTER — Encounter (HOSPITAL_COMMUNITY): Payer: Self-pay | Admitting: *Deleted

## 2012-08-27 DIAGNOSIS — R5381 Other malaise: Secondary | ICD-10-CM | POA: Insufficient documentation

## 2012-08-27 DIAGNOSIS — R05 Cough: Secondary | ICD-10-CM | POA: Insufficient documentation

## 2012-08-27 DIAGNOSIS — Z79899 Other long term (current) drug therapy: Secondary | ICD-10-CM | POA: Insufficient documentation

## 2012-08-27 DIAGNOSIS — R059 Cough, unspecified: Secondary | ICD-10-CM | POA: Insufficient documentation

## 2012-08-27 DIAGNOSIS — R531 Weakness: Secondary | ICD-10-CM

## 2012-08-27 DIAGNOSIS — F17200 Nicotine dependence, unspecified, uncomplicated: Secondary | ICD-10-CM | POA: Insufficient documentation

## 2012-08-27 DIAGNOSIS — M129 Arthropathy, unspecified: Secondary | ICD-10-CM | POA: Insufficient documentation

## 2012-08-27 DIAGNOSIS — J3489 Other specified disorders of nose and nasal sinuses: Secondary | ICD-10-CM | POA: Insufficient documentation

## 2012-08-27 DIAGNOSIS — I10 Essential (primary) hypertension: Secondary | ICD-10-CM | POA: Insufficient documentation

## 2012-08-27 LAB — CBC WITH DIFFERENTIAL/PLATELET
Basophils Absolute: 0.1 10*3/uL (ref 0.0–0.1)
Basophils Relative: 1 % (ref 0–1)
Eosinophils Absolute: 0.3 10*3/uL (ref 0.0–0.7)
Eosinophils Relative: 2 % (ref 0–5)
MCH: 34.1 pg — ABNORMAL HIGH (ref 26.0–34.0)
MCHC: 34.7 g/dL (ref 30.0–36.0)
MCV: 98.2 fL (ref 78.0–100.0)
Platelets: 396 10*3/uL (ref 150–400)
RDW: 11.9 % (ref 11.5–15.5)

## 2012-08-27 LAB — BASIC METABOLIC PANEL
Calcium: 9.1 mg/dL (ref 8.4–10.5)
GFR calc Af Amer: 88 mL/min — ABNORMAL LOW (ref >90)
GFR calc non Af Amer: 76 mL/min — ABNORMAL LOW (ref >90)
Glucose, Bld: 86 mg/dL (ref 70–99)
Sodium: 140 mEq/L (ref 135–145)

## 2012-08-27 LAB — TROPONIN I: Troponin I: <0.3 ng/mL (ref <0.30)

## 2012-08-27 MED ORDER — SODIUM CHLORIDE 0.9 % IV SOLN
Freq: Once | INTRAVENOUS | Status: AC
  Administered 2012-08-27: 1000 mL via INTRAVENOUS

## 2012-08-27 NOTE — ED Provider Notes (Signed)
History  This chart was scribed for Donnetta Hutching, MD by Manuela Schwartz, ED scribe. This patient was seen in room APA02/APA02 and the patient's care was started at 1051.   CSN: [id number]  Arrival date & time 08/27/12  1051   First MD Initiated Contact with Patient 08/27/12 1111      Chief Complaint  Patient presents with  . Chest Pain   Patient is a 56 y.o. female presenting with chest pain. The history is provided by the patient. No language interpreter was used.  Chest Pain The chest pain began 1 - 2 weeks ago. Chest pain occurs intermittently. The chest pain is unchanged. Associated with: nothing specific. The severity of the pain is moderate. The quality of the pain is described as heavy. The pain does not radiate. Primary symptoms include fatigue and cough. Pertinent negatives for primary symptoms include no fever, no shortness of breath, no nausea and no vomiting.  Pertinent negatives for associated symptoms include no weakness. She tried nothing for the symptoms. Risk factors include smoking/tobacco exposure.    Carol Rowe is a 56 y.o. female who presents to the Emergency Department complaining of intermittent chest heaviness for the past 2 weeks. She states nothing seems to make her chest pain worse or better but she also complains of bilateral arm/elbow/shoulder weakness/heaviness also for the past [redacted] weeks along with diffuse weakness which she attributes to overall fatigue. She states however that she is worried about her heart because she is a smoker (0.5 ppd) and her dad had bypass surgery in his 50'sShe states occasional SOB, cough, fever, abdominal pain, nausea. She denies any current CP here in ED. She states recently has been congested and tried taking mucinex D and Zyrtec D without improvement in her symptoms.  She occasionally drinks alcohol.   No PCP  Past Medical History  Diagnosis Date  . Vulvar lesion   . Hypertension   . Arthritis   . VIN III (vulvar intraepithelial  neoplasia III)     Past Surgical History  Procedure Date  . Abdominal hysterectomy 1989  . Cholecystectomy   . Skin cancer removal   . Cesarean section     Family History  Problem Relation Age of Onset  . Breast cancer Mother   . Colon cancer Father   . Heart disease Father   . Heart disease Maternal Grandmother   . Diabetes Paternal Grandmother     History  Substance Use Topics  . Smoking status: Current Some Day Smoker -- 0.3 packs/day    Types: Cigarettes  . Smokeless tobacco: Not on file  . Alcohol Use: Yes     Comment: occ    OB History    Grav Para Term Preterm Abortions TAB SAB Ect Mult Living                  Review of Systems  Constitutional: Positive for fatigue. Negative for fever, chills and unexpected weight change.  HENT: Positive for congestion.   Respiratory: Positive for cough. Negative for shortness of breath.   Cardiovascular: Positive for chest pain (chest heaviness).  Gastrointestinal: Negative for nausea and vomiting.  Musculoskeletal:       Heavy/weak bilateral arms/elbows/shoulders  Neurological: Negative for weakness.  All other systems reviewed and are negative.    Allergies  Macrobid and Mirtazapine  Home Medications   Current Outpatient Rx  Name  Route  Sig  Dispense  Refill  . TYLENOL PO   Oral   Take by  mouth as needed.         Marland Kitchen XANAX PO   Oral   Take by mouth at bedtime.         Marland Kitchen VIVELLE-DOT TD   Transdermal   Place onto the skin once a week.         . FUROSEMIDE 40 MG PO TABS   Oral   Take 40 mg by mouth daily.         Marland Kitchen NEURONTIN PO   Oral   Take by mouth as needed.         Marland Kitchen HYDROCODONE-ACETAMINOPHEN PO   Oral   Take by mouth as needed.         Marland Kitchen ALTACE PO   Oral   Take by mouth daily.           Triage Vitals: Temp 98.2 F (36.8 C) (Oral)  Ht 5\' 2"  (1.575 m)  Wt 125 lb (56.7 kg)  BMI 22.86 kg/m2  Physical Exam  Nursing note and vitals reviewed. Constitutional: She is oriented  to person, place, and time. She appears well-developed and well-nourished.  HENT:  Head: Normocephalic and atraumatic.  Eyes: Conjunctivae normal and EOM are normal. Pupils are equal, round, and reactive to light.  Neck: Normal range of motion. Neck supple.  Cardiovascular: Normal rate, regular rhythm and normal heart sounds.   Pulmonary/Chest: Effort normal and breath sounds normal.  Abdominal: Soft. Bowel sounds are normal.  Musculoskeletal: Normal range of motion.  Neurological: She is alert and oriented to person, place, and time.  Skin: Skin is warm and dry.  Psychiatric: She has a normal mood and affect.    ED Course  Procedures (including critical care time) DIAGNOSTIC STUDIES:   COORDINATION OF CARE: At 1145 AM Discussed treatment plan with patient which includes blood work, cardiac markers, CXR, EKG, IV fluids. Patient agrees.   Labs Reviewed  CBC WITH DIFFERENTIAL - Abnormal; Notable for the following:    WBC 10.9 (*)     MCH 34.1 (*)     All other components within normal limits  BASIC METABOLIC PANEL - Abnormal; Notable for the following:    GFR calc non Af Amer 76 (*)     GFR calc Af Amer 88 (*)     All other components within normal limits  TROPONIN I  TSH   Dg Chest Portable 1 View  08/27/2012  *RADIOLOGY REPORT*  Clinical Data: Chest and neck pain, shortness of breath, no injury  PORTABLE CHEST - 1 VIEW  Comparison: Chest x-ray of 11/22/2011  Findings: The lungs are clear other than mild basilar atelectasis present.  The heart is within normal limits in size.  No bony abnormality is seen.  IMPRESSION: No active lung disease.  Mild basilar linear atelectasis.   Original Report Authenticated By: Dwyane Dee, M.D.      No diagnosis found.   Date: 08/27/2012  Rate: 89  Rhythm: normal sinus rhythm  QRS Axis: normal  Intervals: normal  ST/T Wave abnormalities: normal  Conduction Disutrbances: none  Narrative Interpretation: unremarkable     MDM    Screening tests normal. History vague for coronary artery disease, but diagnosis is still a possibility.  Made appointment for cardiology on Thursday.  No acute distress at discharge    I personally performed the services described in this documentation, which was scribed in my presence. The recorded information has been reviewed and is accurate.          Donnetta Hutching,  MD 08/27/12 1432

## 2012-08-27 NOTE — ED Notes (Signed)
Chest pain for 2 weeks with intermittent pain in both arms.  SOB

## 2012-08-27 NOTE — ED Notes (Signed)
Called [facility]) to schedule appt for CP follow-up.  She has an appt for Thurs. 3 pm with Santina Evans.  Dr. Adriana Simas informed.

## 2012-08-27 NOTE — ED Notes (Signed)
Pt reports intermittent heaviness in chest and aching in arms and shoulder blades with nausea and SOB x 2 weeks.  Pt presently denies chest or back pain but is aching in her arms.  Pt says the pain moves around.  Today it started in R arm and now is in left arm.

## 2012-08-28 LAB — TSH: TSH: 0.602 u[IU]/mL (ref 0.350–4.500)

## 2012-08-29 ENCOUNTER — Encounter: Payer: Self-pay | Admitting: Adult Health

## 2012-08-29 ENCOUNTER — Ambulatory Visit (INDEPENDENT_AMBULATORY_CARE_PROVIDER_SITE_OTHER): Payer: BC Managed Care – PPO | Admitting: Adult Health

## 2012-08-29 VITALS — Ht 63.0 in | Wt 126.0 lb

## 2012-08-29 DIAGNOSIS — F17200 Nicotine dependence, unspecified, uncomplicated: Secondary | ICD-10-CM

## 2012-08-29 DIAGNOSIS — I10 Essential (primary) hypertension: Secondary | ICD-10-CM

## 2012-08-29 DIAGNOSIS — R300 Dysuria: Secondary | ICD-10-CM

## 2012-08-29 DIAGNOSIS — R0989 Other specified symptoms and signs involving the circulatory and respiratory systems: Secondary | ICD-10-CM

## 2012-08-29 DIAGNOSIS — R0789 Other chest pain: Secondary | ICD-10-CM | POA: Insufficient documentation

## 2012-08-29 DIAGNOSIS — R0609 Other forms of dyspnea: Secondary | ICD-10-CM

## 2012-08-29 NOTE — Assessment & Plan Note (Signed)
I have discussed this respect her with her. Stating this is the #1 cause of myocardial infarction in men and women. She states she is going to try and cut down and I believe that she is willing to make the effort.

## 2012-08-29 NOTE — Assessment & Plan Note (Signed)
[name] has cardiovascular risk factors to include hypertension, family history of CAD in father sister and brothers, ongoing tobacco abuse, unknown cholesterol status, with secondary risk factors of anxiety and depression. She has not had prior cardiac workup. We will plan a stress echocardiogram for evaluation of LV systolic dysfunction, and wall motion abnormalities. This has been explained to the patient verbalizes understanding and is willing to proceed. She will not change any of her medications, and continue on ACE inhibitor and diuretic. Noted that her potassium is 3.5. She will have her labs repeated to include a be met and a TSH.

## 2012-08-29 NOTE — Assessment & Plan Note (Signed)
Excellent control of blood pressure at this time. We will not make any changes at to her medication regimen.

## 2012-08-29 NOTE — Patient Instructions (Addendum)
Your physician recommends that you schedule a follow-up appointment in: FOLLOW UP WITH [facility] AFTER ECHO  Your physician recommends that you return for lab work in: TODAY FOR URINE ANALYSIS IN [place] [facility]  TOMORROW FOR FASTING LABS AT [facility], Located across from current [employer], second floor   Your physician has requested that you have a stress echocardiogram. For further information please visit [email or web address] Please follow instruction sheet as given.

## 2012-08-29 NOTE — Progress Notes (Deleted)
Name: Carol Rowe    DOB: 11/05/1955  Age: 56 y.o.  MR#: [id number]       PCP:  No primary provider on file.      Insurance: @PAYORNAME @   CC:    Chief Complaint  Patient presents with  . Follow-up    Post ED visit for chest pains, pt. states the pain was more in hte neck shoulder and elbows    VS Ht 5\' 3"  (1.6 m)  Wt 126 lb 0.6 oz (57.171 kg)  BMI 22.33 kg/m2  Weights Current Weight  08/29/12 126 lb 0.6 oz (57.171 kg)  08/27/12 125 lb (56.7 kg)  07/11/12 125 lb (56.7 kg)    Blood Pressure  BP Readings from Last 3 Encounters:  08/27/12 139/79  07/11/12 112/62  01/05/09 120/70     Admit date:  (Not on file) Last encounter with RMR:  Visit date not found   Allergy Allergies  Allergen Reactions  . Macrobid (Nitrofurantoin Macrocrystal) Hives  . Mirtazapine     REACTION: causes swelling    Current Outpatient Prescriptions  Medication Sig Dispense Refill  . ALPRAZolam (XANAX) 0.5 MG tablet Take 0.5 mg by mouth at bedtime as needed. 1/2 tablet daily for anxiety and to sleep      . diphenhydramine-acetaminophen (TYLENOL PM) 25-500 MG TABS Take 1 tablet by mouth at bedtime as needed.      Marland Kitchen estradiol (VIVELLE-DOT) 0.1 MG/24HR Place 1 patch onto the skin 2 (two) times a week.      . furosemide (LASIX) 40 MG tablet Take 40 mg by mouth daily.      . montelukast (SINGULAIR) 10 MG tablet Take 10 mg by mouth at bedtime.      . ramipril (ALTACE) 2.5 MG capsule Take 2.5 mg by mouth daily.        Discontinued Meds:    Medications Discontinued During This Encounter  Medication Reason  . acetaminophen (TYLENOL) 500 MG tablet Error  . gabapentin (NEURONTIN) 300 MG capsule Error  . HYDROcodone-acetaminophen (NORCO/VICODIN) 5-325 MG per tablet Error    Patient Active Problem List  Diagnosis  . MELANOMA  . ANXIETY  . CIGARETTE SMOKER  . DEPRESSION  . HYPERTENSION  . SCHATZKI'S RING  . GERD  . CONSTIPATION, CHRONIC  . ARTHRITIS  . OSTEOPOROSIS  . NAUSEA, CHRONIC  .  EPIGASTRIC PAIN  . DIVERTICULITIS, HX OF    LABS Admission on 08/27/2012, Discharged on 08/27/2012  Component Date Value  . WBC 08/27/2012 10.9*  . RBC 08/27/2012 4.37   . Hemoglobin 08/27/2012 14.9   . HCT 08/27/2012 42.9   . MCV 08/27/2012 98.2   . MCH 08/27/2012 34.1*  . MCHC 08/27/2012 34.7   . RDW 08/27/2012 11.9   . Platelets 08/27/2012 396   . Neutrophils Relative 08/27/2012 60   . Neutro Abs 08/27/2012 6.5   . Lymphocytes Relative 08/27/2012 29   . Lymphs Abs 08/27/2012 3.2   . Monocytes Relative 08/27/2012 8   . Monocytes Absolute 08/27/2012 0.9   . Eosinophils Relative 08/27/2012 2   . Eosinophils Absolute 08/27/2012 0.3   . Basophils Relative 08/27/2012 1   . Basophils Absolute 08/27/2012 0.1   . Sodium 08/27/2012 140   . Potassium 08/27/2012 3.5   . Chloride 08/27/2012 102   . CO2 08/27/2012 28   . Glucose, Bld 08/27/2012 86   . BUN 08/27/2012 10   . Creatinine, Ser 08/27/2012 0.85   . Calcium 08/27/2012 9.1   . GFR calc  non Af Amer 08/27/2012 76*  . GFR calc Af Amer 08/27/2012 88*  . Troponin I 08/27/2012 <0.30   . TSH 08/27/2012 0.602      Results for this Opt Visit:     Results for orders placed during the hospital encounter of 08/27/12  CBC WITH DIFFERENTIAL      Component Value Range   WBC 10.9 (*) 4.0 - 10.5 K/uL   RBC 4.37  3.87 - 5.11 MIL/uL   Hemoglobin 14.9  12.0 - 15.0 g/dL   HCT 95.6  21.3 - 08.6 %   MCV 98.2  78.0 - 100.0 fL   MCH 34.1 (*) 26.0 - 34.0 pg   MCHC 34.7  30.0 - 36.0 g/dL   RDW 57.8  46.9 - 62.9 %   Platelets 396  150 - 400 K/uL   Neutrophils Relative 60  43 - 77 %   Neutro Abs 6.5  1.7 - 7.7 K/uL   Lymphocytes Relative 29  12 - 46 %   Lymphs Abs 3.2  0.7 - 4.0 K/uL   Monocytes Relative 8  3 - 12 %   Monocytes Absolute 0.9  0.1 - 1.0 K/uL   Eosinophils Relative 2  0 - 5 %   Eosinophils Absolute 0.3  0.0 - 0.7 K/uL   Basophils Relative 1  0 - 1 %   Basophils Absolute 0.1  0.0 - 0.1 K/uL  BASIC METABOLIC PANEL       Component Value Range   Sodium 140  135 - 145 mEq/L   Potassium 3.5  3.5 - 5.1 mEq/L   Chloride 102  96 - 112 mEq/L   CO2 28  19 - 32 mEq/L   Glucose, Bld 86  70 - 99 mg/dL   BUN 10  6 - 23 mg/dL   Creatinine, Ser 5.28  0.50 - 1.10 mg/dL   Calcium 9.1  8.4 - 41.3 mg/dL   GFR calc non Af Amer 76 (*) >90 mL/min   GFR calc Af Amer 88 (*) >90 mL/min  TROPONIN I      Component Value Range   Troponin I <0.30  <0.30 ng/mL  TSH      Component Value Range   TSH 0.602  0.350 - 4.500 uIU/mL    EKG Orders placed during the hospital encounter of 08/27/12  . EKG 12-LEAD  . EKG 12-LEAD  . ED EKG  . ED EKG  . EKG     Prior Assessment and Plan Problem List as of 08/29/2012            Cardiology Problems   HYPERTENSION     Other   MELANOMA   ANXIETY   CIGARETTE SMOKER   DEPRESSION   SCHATZKI'S RING   GERD   CONSTIPATION, CHRONIC   ARTHRITIS   OSTEOPOROSIS   NAUSEA, CHRONIC   EPIGASTRIC PAIN   DIVERTICULITIS, HX OF       Imaging: Dg Chest Portable 1 View  08/27/2012  *RADIOLOGY REPORT*  Clinical Data: Chest and neck pain, shortness of breath, no injury  PORTABLE CHEST - 1 VIEW  Comparison: Chest x-ray of 11/22/2011  Findings: The lungs are clear other than mild basilar atelectasis present.  The heart is within normal limits in size.  No bony abnormality is seen.  IMPRESSION: No active lung disease.  Mild basilar linear atelectasis.   Original Report Authenticated By: Dwyane Dee, M.D.      [facility] Calculation: Score not calculated. Missing: Total Cholesterol, HDL

## 2012-08-29 NOTE — Progress Notes (Signed)
HPI: Mrs. Niederer is a 56 year old widowed female who is here in our office to be est. with the practice as a new patient. She is here post ED visit for chest pains. Patient has multiple cardiovascular risk factors to include hypertension unknown cholesterol status, tobacco abuse, and family history. She states that over the last 2 weeks she is having recurrent discomfort in her chest which she describes as heavy, lasting anywhere from 8-10 minutes at rest, she also has pain in her elbows and arms described as an ache, across her shoulder girdle legs, and left neck. She was seen in the emergency room and was diagnosed with noncardiac chest pain, TSH was 0.602, negative troponins, she did have mildly elevated white blood cells at 10.9. She was negative for PE, she is noted to be mildly hypokalemic with a potassium of 3.5. As a result of her symptoms, the patient was advised to see cardiology for further assessment and treatment. She states that the pain comes on its own and nothing makes it feel better. Just been taking over-the-counter decongestants to include Mucinex D and Zyrtec D. secondary to chest congestion and sinus congestion. She states that the arm pain is there all the time, but the pressure in her chest comes on occasion only. She denies any associated diaphoresis but she did have some low-grade nausea, and mild shortness of breath.  Allergies  Allergen Reactions  . Macrobid (Nitrofurantoin Macrocrystal) Hives  . Mirtazapine     REACTION: causes swelling    Current Outpatient Prescriptions  Medication Sig Dispense Refill  . ALPRAZolam (XANAX) 0.5 MG tablet Take 0.5 mg by mouth at bedtime as needed. 1/2 tablet daily for anxiety and to sleep      . diphenhydramine-acetaminophen (TYLENOL PM) 25-500 MG TABS Take 1 tablet by mouth at bedtime as needed.      Marland Kitchen estradiol (VIVELLE-DOT) 0.1 MG/24HR Place 1 patch onto the skin 2 (two) times a week.      . furosemide (LASIX) 40 MG tablet Take 40  mg by mouth daily.      . montelukast (SINGULAIR) 10 MG tablet Take 10 mg by mouth at bedtime.      . ramipril (ALTACE) 2.5 MG capsule Take 2.5 mg by mouth daily.        Past Medical History  Diagnosis Date  . Vulvar lesion   . Hypertension   . Arthritis   . VIN III (vulvar intraepithelial neoplasia III)     Past Surgical History  Procedure Date  . Abdominal hysterectomy 1989  . Cholecystectomy   . Skin cancer removal   . Cesarean section     Family History  Problem Relation Age of Onset  . Breast cancer Mother   . Colon cancer Father   . Heart disease Father   . Heart disease Maternal Grandmother   . Diabetes Paternal Grandmother     History   Social History  . Marital Status: Legally Separated    Spouse Name: N/A    Number of Children: N/A  . Years of Education: N/A   Occupational History  . Not on file.   Social History Main Topics  . Smoking status: Current Some Day Smoker -- 0.3 packs/day    Types: Cigarettes  . Smokeless tobacco: Not on file  . Alcohol Use: Yes     Comment: occ  . Drug Use: No  . Sexually Active: Yes    Birth Control/ Protection: Surgical   Other Topics Concern  . Not  on file   Social History Narrative  . No narrative on file   PHYSICAL EXAM Ht 5\' 3"  (1.6 m)  Wt 126 lb 0.6 oz (57.171 kg)  BMI 22.33 kg/m2  ZOX:WRUEAV of systems complete and found to be negative unless listed above General: Well developed, well nourished, in no acute distress Head: Eyes PERRLA, No xanthomas.   Normal cephalic and atramatic  Lungs: Clear bilaterally to auscultation and percussion. Heart: HRRR S1 S2, without MRG.  Pulses are 2+ & equal.            No carotid bruit. No JVD.  No abdominal bruits. No femoral bruits. Abdomen: Bowel sounds are positive, abdomen soft and non-tender without masses or                  Hernia's noted. Msk:  Back normal, normal gait. Normal strength and tone for age. Extremities: No clubbing, cyanosis or edema.  DP  +1 Neuro: Alert and oriented X 3. Psych:  Good affect, responds appropriately    EKG: NSR rate of 89 bpm.  ASSESSMENT AND PLAN

## 2012-08-30 ENCOUNTER — Other Ambulatory Visit: Payer: Self-pay | Admitting: Adult Health

## 2012-08-30 LAB — BASIC METABOLIC PANEL

## 2012-08-30 LAB — HEPATIC FUNCTION PANEL

## 2012-08-30 LAB — URINALYSIS
Hgb urine dipstick: NEGATIVE
Ketones, ur: NEGATIVE mg/dL
Nitrite: NEGATIVE
Protein, ur: NEGATIVE mg/dL
pH: 5 (ref 5.0–8.0)

## 2012-08-30 LAB — TSH

## 2012-08-30 LAB — LIPID PANEL

## 2012-08-31 LAB — BASIC METABOLIC PANEL
BUN: 11 mg/dL (ref 6–23)
CO2: 29 mEq/L (ref 19–32)
Calcium: 9.3 mg/dL (ref 8.4–10.5)
Chloride: 106 mEq/L (ref 96–112)
Creat: 0.78 mg/dL (ref 0.50–1.10)

## 2012-08-31 LAB — HEPATIC FUNCTION PANEL
AST: 18 U/L (ref 0–37)
Albumin: 4.3 g/dL (ref 3.5–5.2)
Alkaline Phosphatase: 100 U/L (ref 39–117)
Indirect Bilirubin: 0.3 mg/dL (ref 0.0–0.9)
Total Bilirubin: 0.4 mg/dL (ref 0.3–1.2)

## 2012-08-31 LAB — LIPID PANEL
HDL: 54 mg/dL (ref >39)
LDL Cholesterol: 84 mg/dL (ref 0–99)

## 2012-09-02 ENCOUNTER — Encounter: Payer: Self-pay | Admitting: *Deleted

## 2012-09-09 ENCOUNTER — Telehealth: Payer: Self-pay | Admitting: Gynecologic Oncology

## 2012-09-09 NOTE — Telephone Encounter (Signed)
Pt called to cancel her in office procedure on Thursday, Dec.5.  Requesting to reschedule her appointment for just a "recheck."  Appointment rescheduled to Dec. 18, 2013 with Dr. Duard Brady.  Instructed to call for any questions or concerns.

## 2012-09-10 ENCOUNTER — Other Ambulatory Visit (HOSPITAL_COMMUNITY): Payer: BC Managed Care – PPO

## 2012-09-12 ENCOUNTER — Ambulatory Visit: Payer: BC Managed Care – PPO | Admitting: Gynecologic Oncology

## 2012-09-19 ENCOUNTER — Ambulatory Visit: Payer: BC Managed Care – PPO | Admitting: Cardiology

## 2012-09-25 ENCOUNTER — Encounter: Payer: Self-pay | Admitting: Gynecologic Oncology

## 2012-09-25 ENCOUNTER — Ambulatory Visit: Payer: BC Managed Care – PPO | Attending: Gynecologic Oncology | Admitting: Gynecologic Oncology

## 2012-09-25 VITALS — BP 126/80 | HR 88 | Temp 98.8°F | Resp 20 | Ht 62.36 in | Wt 123.4 lb

## 2012-09-25 DIAGNOSIS — Z803 Family history of malignant neoplasm of breast: Secondary | ICD-10-CM | POA: Insufficient documentation

## 2012-09-25 DIAGNOSIS — F17200 Nicotine dependence, unspecified, uncomplicated: Secondary | ICD-10-CM | POA: Insufficient documentation

## 2012-09-25 DIAGNOSIS — Z90710 Acquired absence of both cervix and uterus: Secondary | ICD-10-CM | POA: Insufficient documentation

## 2012-09-25 DIAGNOSIS — Z800 Family history of malignant neoplasm of digestive organs: Secondary | ICD-10-CM | POA: Insufficient documentation

## 2012-09-25 DIAGNOSIS — D071 Carcinoma in situ of vulva: Secondary | ICD-10-CM | POA: Insufficient documentation

## 2012-09-25 DIAGNOSIS — Z79899 Other long term (current) drug therapy: Secondary | ICD-10-CM | POA: Insufficient documentation

## 2012-09-25 NOTE — Patient Instructions (Signed)
Apply the 5-FU cream as directed daily. Please ensure excellent handwashing techniques and at the cream does not get on any other areas. Please followup with your dermatologist for the lesion on your left temple.

## 2012-09-25 NOTE — Progress Notes (Signed)
Consult Note: Gyn-Onc  Carol Rowe 56 y.o. female  CC:  Chief Complaint  Patient presents with  . VIN III    Follow up    HPI: Carol Rowe is a very pleasant 56 year-old who underwent wide local excision of the vulva that revealed VIN III in 03/2009 with positive lateral margins. We initially saw her in September of 2010 for evaluation of this and continue to follow her through may of 2012. Most recently she had undergone vulvar biopsies and endometrial ablations. Last pathology I have is from January 2012 at which time she had a right vulvar biopsy that revealed VIN-III with positive edges. When I last saw her in May of 2012 there is no evidence of recurrence. She was recently seen by Dr. Jennette Kettle for which time there was a white area on the vulva that was noted in she's been referred back to [place] for evaluation of that.   Interval History:  I last saw her October 3. At that time there was a 1 x 1 cm raised hyperkeratotic ulcerated lesion on the lower aspect of the right labia majora near the introitus. 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 The patient was scheduled for wide local excision canceled. She has been using an herbal oil on this lesion since the part of November. She's been using it every day. She states she believes lesion might be gone. She's had no bleeding. She's had no irritation. She's noticed an increased size of the lesion on her left temple. She was previously seen by dermatology but has not been in for some time.  Current Meds:  Outpatient Encounter Prescriptions as of 09/25/2012  Medication Sig Dispense Refill  . ALPRAZolam (XANAX) 0.5 MG tablet Take 0.5 mg by mouth at bedtime as needed. 1/2 tablet daily for anxiety and to sleep      . diphenhydramine-acetaminophen (TYLENOL PM) 25-500 MG TABS Take 1 tablet by mouth at bedtime as needed.      Marland Kitchen estradiol (VIVELLE-DOT) 0.1 MG/24HR Place 1 patch onto the skin 2 (two) times a week.      . furosemide (LASIX) 40 MG tablet Take  40 mg by mouth daily.      . montelukast (SINGULAIR) 10 MG tablet Take 10 mg by mouth at bedtime.      . ramipril (ALTACE) 2.5 MG capsule Take 2.5 mg by mouth daily.        Allergy:  Allergies  Allergen Reactions  . Macrobid (Nitrofurantoin Macrocrystal) Hives  . Mirtazapine     REACTION: causes swelling    Social Hx:   History   Social History  . Marital Status: Legally Separated    Spouse Name: N/A    Number of Children: N/A  . Years of Education: N/A   Occupational History  . Not on file.   Social History Main Topics  . Smoking status: Current Some Day Smoker -- 0.3 packs/day    Types: Cigarettes  . Smokeless tobacco: Not on file  . Alcohol Use: Yes     Comment: occ  . Drug Use: No  . Sexually Active: Yes    Birth Control/ Protection: Surgical   Other Topics Concern  . Not on file   Social History Narrative  . No narrative on file    Past Surgical Hx:  Past Surgical History  Procedure Date  . Abdominal hysterectomy 1989  . Cholecystectomy   . Skin cancer removal   . Cesarean section     Past Medical Hx:  Past Medical History  Diagnosis Date  . Vulvar lesion   . Hypertension   . Arthritis   . VIN III (vulvar intraepithelial neoplasia III)     Family Hx:  Family History  Problem Relation Age of Onset  . Breast cancer Mother   . Colon cancer Father   . Heart disease Father   . Heart disease Maternal Grandmother   . Diabetes Paternal Grandmother     Vitals:  Blood pressure 126/80, pulse 88, temperature 98.8 F (37.1 C), temperature source Oral, resp. rate 20, height 5' 2.36" (1.584 m), weight 123 lb 6.4 oz (55.974 kg).  Physical Exam: Well-nourished well-developed female in no acute distress.  HEENT: 8 mm raised, flaky lesion on left temple c/w AK.  Pelvic: External genitalia is notable for a 1 x 1.5 cm raised hyperkeratotic lesion on the left labia majora near the introitus. There are no other visible lesions. The patient was shown the  area with the mirror. She agrees that the lesion might be slightly bigger in size.  Assessment/Plan:  56 year old with VIN 3. The patient does not wish to proceed with any surgical intervention including a wide local excision for laser surgery. She wanted to try herbal oil zinc not benefited her with regards to regression of the lesion. Fortunately the lesion is not significantly larger. Fortunately there are no new lesions. She would like the concept of trying a topical ointment. We discussed using 5 fluorouracil. She would like to do that. She's given a prescription for 5-FU 5% cream to apply to the lesion daily strict precautions regarding not getting the cream on other areas of the body and handwashing were discussed with the patient. She'll return to see me in 3 months. She will follow up with her dermatologist. Thompson Caul., MD 09/25/2012, 12:08 PM

## 2012-12-19 ENCOUNTER — Encounter: Payer: Self-pay | Admitting: Gynecologic Oncology

## 2012-12-19 ENCOUNTER — Other Ambulatory Visit: Payer: BC Managed Care – PPO | Admitting: Lab

## 2012-12-19 ENCOUNTER — Ambulatory Visit: Payer: BC Managed Care – PPO | Attending: Gynecologic Oncology | Admitting: Gynecologic Oncology

## 2012-12-19 VITALS — BP 100/60 | HR 88 | Temp 98.8°F | Resp 18 | Ht 62.36 in | Wt 129.6 lb

## 2012-12-19 DIAGNOSIS — F17200 Nicotine dependence, unspecified, uncomplicated: Secondary | ICD-10-CM

## 2012-12-19 DIAGNOSIS — D071 Carcinoma in situ of vulva: Secondary | ICD-10-CM | POA: Insufficient documentation

## 2012-12-19 DIAGNOSIS — I10 Essential (primary) hypertension: Secondary | ICD-10-CM | POA: Insufficient documentation

## 2012-12-19 DIAGNOSIS — Z79899 Other long term (current) drug therapy: Secondary | ICD-10-CM | POA: Insufficient documentation

## 2012-12-19 DIAGNOSIS — R0789 Other chest pain: Secondary | ICD-10-CM

## 2012-12-19 DIAGNOSIS — R0609 Other forms of dyspnea: Secondary | ICD-10-CM

## 2012-12-19 LAB — URINALYSIS, MICROSCOPIC - CHCC
Leukocyte Esterase: NEGATIVE
Protein: NEGATIVE mg/dL
Urobilinogen, UR: 0.2 mg/dL (ref 0.2–1)

## 2012-12-19 NOTE — Patient Instructions (Signed)
Please remember to do vulvar checks at home. Please call Dr. Scharlene Gloss office to be seen as soon as possible. If her pain increases please go to your local emergency room. Return to see me in 3 months

## 2012-12-19 NOTE — Progress Notes (Signed)
Consult Note: Gyn-Onc  Delona Clasby Mago 57 y.o. female  CC:  Chief Complaint  Patient presents with  . VIN III    Follow up    HPI: Ms. 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 We initially saw her in September of 2010 for evaluation of this and continue to follow her through may of 2012. Most recently she had undergone vulvar biopsies and endometrial ablations. Last pathology I have is from January 2012 at which time she had a right vulvar biopsy that revealed VIN-III with positive edges. When I last saw her in May of 2012 there is no evidence of recurrence.   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 I saw her back in December at which time she had a 1 x 1.5 cm lesion on the labia near the introitus. She did not want to proceed with any surgical intervention wanted to try 5-fluorouracil which she has been doing spell the last 3 months. She stay she's had minimal irritation from the 5-FU has been able to use it.  Interval History:  I last saw her in December. At that time there was a 1 x 1.5 cm raised hyperkeratotic ulcerated lesion on the lower aspect of the right labia majora near the introitus. 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  Review of Systems: She comes in today stating that she's had left-sided back pain which she describes as "kidney" pain for the past 3 weeks. There's no burning or dysuria. She's not noticed any hematuria. This is for some that she's had that discomfort. Over the past 3 weeks she noticed that the pain has been increasing and she is now using hydrocodone once a day for pain. There is no history of any,. She has not contacted her primary care physician. She's had 2 days of constipation and status has had 2 days of some lower abdominal pain. She had 2 bowel movements today. She did have a slight amount of irritation  with the 5-FU. She's been using it regularly every day. There's been no itching or burning. However she has had a mild odor. Her weight is up approximately 6 pounds since I saw her in December.   Current Meds:  Outpatient Encounter Prescriptions as of 12/19/2012  Medication Sig Dispense Refill  . ALPRAZolam (XANAX) 0.5 MG tablet Take 0.5 mg by mouth at bedtime as needed. 1/2 tablet daily for anxiety and to sleep      . diphenhydramine-acetaminophen (TYLENOL PM) 25-500 MG TABS Take 1 tablet by mouth at bedtime as needed.      Marland Kitchen estradiol (VIVELLE-DOT) 0.1 MG/24HR Place 1 patch onto the skin 2 (two) times a week.      . furosemide (LASIX) 40 MG tablet Take 40 mg by mouth daily.      Marland Kitchen gabapentin (NEURONTIN) 300 MG capsule Take 300 mg by mouth at bedtime.      . montelukast (SINGULAIR) 10 MG tablet Take 10 mg by mouth at bedtime.      . ramipril (ALTACE) 2.5 MG capsule Take 2.5 mg by mouth daily.       No facility-administered encounter medications on file as of 12/19/2012.    Allergy:  Allergies  Allergen Reactions  . Macrobid (Nitrofurantoin Macrocrystal) Hives  . Mirtazapine     REACTION: causes swelling    Social Hx:   History  Social History  . Marital Status: Legally Separated    Spouse Name: N/A    Number of Children: N/A  . Years of Education: N/A   Occupational History  . Not on file.   Social History Main Topics  . Smoking status: Current Some Day Smoker -- 0.30 packs/day    Types: Cigarettes  . Smokeless tobacco: Not on file  . Alcohol Use: Yes     Comment: occ  . Drug Use: No  . Sexually Active: Yes    Birth Control/ Protection: Surgical   Other Topics Concern  . Not on file   Social History Narrative  . No narrative on file    Past Surgical Hx:  Past Surgical History  Procedure Laterality Date  . Abdominal hysterectomy  1989  . Cholecystectomy    . Skin cancer removal    . Cesarean section      Past Medical Hx:  Past Medical History  Diagnosis  Date  . Vulvar lesion   . Hypertension   . Arthritis   . VIN III (vulvar intraepithelial neoplasia III)     Family Hx:  Family History  Problem Relation Age of Onset  . Breast cancer Mother   . Colon cancer Father   . Heart disease Father   . Heart disease Maternal Grandmother   . Diabetes Paternal Grandmother     Vitals:  Blood pressure 100/60, pulse 88, temperature 98.8 F (37.1 C), temperature source Oral, resp. rate 18, height 5' 2.36" (1.584 m), weight 129 lb 9.6 oz (58.786 kg).  Physical Exam: Well-nourished well-developed female in no acute distress.  Tobacco: She does not have any costovertebral angle tenderness on the left side with pounding. However with palpation along the left side of the spine she does complain of some tenderness.  Abdomen: Soft diffusely tender throughout there is no rebound or guarding. She is slightly tympanitic in distended. There is no fluid wave. There is no masses. There is no hepatosplenomegaly.  Pelvic: She has a 1 cm sebaceous cyst just at the midline of the introitus with a punctate head. There is no evidence of dysplasia. Bimanual examination reveals diffuse tenderness throughout the abdomen but not with the pelvic hand.  Assessment/Plan: 57 year old history VAI and 3 who comes in today for followup of that. With regards her dysplasia she had a nice response with the 5-FU. I counseled her to do monthly vulvar checks. In the absence of any lesions return to see me in 3-4 months. If she develops a new lesion she knows to contact us.  With regards to her back and abdominal pain. We will order a urinalysis today and checked those results for her. She was asked to contact her primary physician Dr. Dwana Melena to see if she can be seen in evaluation for her symptoms as they've been persistent or progressing over the past 3 weeks.  Jeidy Hoerner A., MD 12/19/2012, 11:44 AM

## 2012-12-20 ENCOUNTER — Telehealth: Payer: Self-pay | Admitting: Gynecologic Oncology

## 2012-12-20 NOTE — Telephone Encounter (Signed)
Spoke with patient regarding the results of the urinalysis which was negative.  She did not contact Dr. Margo Aye as we had discussed.  She is continuing to have back pain and I again encouraged her to call him. She states that she will. PG

## 2012-12-20 NOTE — Telephone Encounter (Signed)
Message left about urinalysis results: no signs of infection.  Instructed to call the office for any questions or concerns.

## 2013-06-30 ENCOUNTER — Encounter: Payer: Self-pay | Admitting: *Deleted

## 2014-06-17 ENCOUNTER — Other Ambulatory Visit: Payer: Self-pay | Admitting: Obstetrics & Gynecology

## 2014-06-18 LAB — CYTOLOGY - PAP

## 2014-09-23 ENCOUNTER — Ambulatory Visit (HOSPITAL_COMMUNITY)
Admission: RE | Admit: 2014-09-23 | Discharge: 2014-09-23 | Disposition: A | Discharge status: Home / Self Care | Payer: BC Managed Care – PPO | Source: Ambulatory Visit | Attending: Internal Medicine | Admitting: Internal Medicine

## 2014-09-23 ENCOUNTER — Other Ambulatory Visit (HOSPITAL_COMMUNITY): Payer: Self-pay | Admitting: Internal Medicine

## 2014-09-23 DIAGNOSIS — R079 Chest pain, unspecified: Secondary | ICD-10-CM | POA: Insufficient documentation

## 2014-09-23 DIAGNOSIS — J449 Chronic obstructive pulmonary disease, unspecified: Secondary | ICD-10-CM | POA: Insufficient documentation

## 2014-09-23 DIAGNOSIS — R0602 Shortness of breath: Secondary | ICD-10-CM | POA: Diagnosis present

## 2015-06-21 ENCOUNTER — Other Ambulatory Visit: Payer: Self-pay | Admitting: Obstetrics & Gynecology

## 2015-06-22 LAB — CYTOLOGY - PAP

## 2017-09-03 ENCOUNTER — Encounter (HOSPITAL_COMMUNITY): Payer: Self-pay

## 2017-09-03 ENCOUNTER — Other Ambulatory Visit: Payer: Self-pay

## 2017-09-03 ENCOUNTER — Encounter (HOSPITAL_COMMUNITY): Payer: BC Managed Care – PPO | Attending: Oncology | Admitting: Oncology

## 2017-09-03 ENCOUNTER — Other Ambulatory Visit (HOSPITAL_COMMUNITY): Payer: Self-pay | Admitting: Oncology

## 2017-09-03 ENCOUNTER — Encounter (HOSPITAL_COMMUNITY): Payer: BC Managed Care – PPO

## 2017-09-03 DIAGNOSIS — F17210 Nicotine dependence, cigarettes, uncomplicated: Secondary | ICD-10-CM | POA: Diagnosis not present

## 2017-09-03 DIAGNOSIS — D72829 Elevated white blood cell count, unspecified: Secondary | ICD-10-CM

## 2017-09-03 DIAGNOSIS — D473 Essential (hemorrhagic) thrombocythemia: Secondary | ICD-10-CM | POA: Diagnosis present

## 2017-09-03 DIAGNOSIS — D75839 Thrombocytosis, unspecified: Secondary | ICD-10-CM | POA: Insufficient documentation

## 2017-09-03 DIAGNOSIS — I10 Essential (primary) hypertension: Secondary | ICD-10-CM

## 2017-09-03 LAB — CBC WITH DIFFERENTIAL/PLATELET
BASOS ABS: 0.1 10*3/uL (ref 0.0–0.1)
BASOS PCT: 1 %
EOS ABS: 0.3 10*3/uL (ref 0.0–0.7)
Eosinophils Relative: 4 %
HEMATOCRIT: 43.1 % (ref 36.0–46.0)
HEMOGLOBIN: 13.9 g/dL (ref 12.0–15.0)
Lymphocytes Relative: 31 %
Lymphs Abs: 2.9 10*3/uL (ref 0.7–4.0)
MCH: 33.3 pg (ref 26.0–34.0)
MCHC: 32.3 g/dL (ref 30.0–36.0)
MCV: 103.4 fL — ABNORMAL HIGH (ref 78.0–100.0)
Monocytes Absolute: 0.8 10*3/uL (ref 0.1–1.0)
Monocytes Relative: 9 %
NEUTROS ABS: 5.2 10*3/uL (ref 1.7–7.7)
NEUTROS PCT: 55 %
Platelets: 369 10*3/uL (ref 150–400)
RBC: 4.17 MIL/uL (ref 3.87–5.11)
RDW: 12.4 % (ref 11.5–15.5)
WBC: 9.3 10*3/uL (ref 4.0–10.5)

## 2017-09-03 LAB — COMPREHENSIVE METABOLIC PANEL
ALBUMIN: 4.1 g/dL (ref 3.5–5.0)
ALT: 16 U/L (ref 14–54)
ANION GAP: 9 (ref 5–15)
AST: 20 U/L (ref 15–41)
Alkaline Phosphatase: 97 U/L (ref 38–126)
BILIRUBIN TOTAL: 0.6 mg/dL (ref 0.3–1.2)
BUN: 12 mg/dL (ref 6–20)
CO2: 28 mmol/L (ref 22–32)
Calcium: 8.7 mg/dL — ABNORMAL LOW (ref 8.9–10.3)
Chloride: 102 mmol/L (ref 101–111)
Creatinine, Ser: 0.86 mg/dL (ref 0.44–1.00)
GFR calc Af Amer: >60 mL/min (ref >60)
GFR calc non Af Amer: >60 mL/min (ref >60)
GLUCOSE: 91 mg/dL (ref 65–99)
POTASSIUM: 3.4 mmol/L — AB (ref 3.5–5.1)
SODIUM: 139 mmol/L (ref 135–145)
Total Protein: 7 g/dL (ref 6.5–8.1)

## 2017-09-03 LAB — IRON AND TIBC
IRON: 122 ug/dL (ref 28–170)
Saturation Ratios: 30 % (ref 10.4–31.8)
TIBC: 402 ug/dL (ref 250–450)
UIBC: 280 ug/dL

## 2017-09-03 LAB — VITAMIN B12: Vitamin B-12: 383 pg/mL (ref 180–914)

## 2017-09-03 LAB — FOLATE: FOLATE: 4.6 ng/mL — AB (ref >5.9)

## 2017-09-03 LAB — FERRITIN: FERRITIN: 34 ng/mL (ref 11–307)

## 2017-09-03 MED ORDER — FOLIC ACID 1 MG PO TABS: 1.0000 mg | ORAL_TABLET | Freq: Every day | ORAL | 1 refills | Status: DC

## 2017-09-03 NOTE — Progress Notes (Signed)
[facility] Initial Visit:  Patient Care Team: Celene Squibb, MD as PCP - General (Internal Medicine) Lendon Colonel, NP as Nurse Practitioner (Nurse Practitioner)  CHIEF COMPLAINTS/PURPOSE OF CONSULTATION:  Chronic mild leukocytosis  HISTORY OF PRESENTING ILLNESS: Carol Rowe 61 y.o. female is here because of chronic mild leukocytosis.  She had routine blood work performed on 08/14/2017 which demonstrated WBC 10.9 K, hemoglobin 13.6 g/dL, hematocrit 40.6%, MCV 101, platelet count 410 K, differential demonstrated an elevated absolute neutrophil count of 7.3K. Review of her previous CBCs demonstrated that back in November 2013 WBC was 10.9 K, hemoglobin 14.9 g/dL, hematocrit 42.9%, platelet count 396K, differential was normal.  Overall patient states that she feels well.  She is a chronic smoker and smokes half a pack to a pack per day for the past 39 years.  She denies any chronic steroid use.  She denies any recent infections.  She denies any chest pain, shortness of breath, abdominal pain, focal weakness.  Review of Systems - Oncology ROS as per HPI otherwise 12 point ROS is negative.  MEDICAL HISTORY: Past Medical History:  Diagnosis Date  . Arthritis   . Hypertension   . VIN III (vulvar intraepithelial neoplasia III)   . Vulvar lesion     SURGICAL HISTORY: Past Surgical History:  Procedure Laterality Date  . ABDOMINAL HYSTERECTOMY  1989  . CESAREAN SECTION    . CHOLECYSTECTOMY    . skin cancer removal      SOCIAL HISTORY: Social History   Socioeconomic History  . Marital status: Legally Separated    Spouse name: Not on file  . Number of children: Not on file  . Years of education: Not on file  . Highest education level: Not on file  Social Needs  . Financial resource strain: Not on file  . Food insecurity - worry: Not on file  . Food insecurity - inability: Not on file  . Transportation needs - medical: Not on file  . Transportation  needs - non-medical: Not on file  Occupational History  . Not on file  Tobacco Use  . Smoking status: Current Some Day Smoker    Packs/day: 0.30    Types: Cigarettes  . Smokeless tobacco: Never Used  Substance and Sexual Activity  . Alcohol use: Yes    Comment: occ  . Drug use: No  . Sexual activity: Yes    Birth control/protection: Surgical  Other Topics Concern  . Not on file  Social History Narrative  . Not on file    FAMILY HISTORY Family History  Problem Relation Age of Onset  . Colon cancer Father   . Heart disease Father   . Breast cancer Mother   . Heart disease Maternal Grandmother   . Diabetes Paternal Grandmother     ALLERGIES:  is allergic to macrobid [nitrofurantoin macrocrystal] and mirtazapine.  MEDICATIONS:  Current Outpatient Medications  Medication Sig Dispense Refill  . ALPRAZolam (XANAX) 0.5 MG tablet Take 0.5 mg by mouth at bedtime as needed. 1/2 tablet daily for anxiety and to sleep    . diphenhydramine-acetaminophen (TYLENOL PM) 25-500 MG TABS Take 1 tablet by mouth at bedtime as needed.    Marland Kitchen estradiol (VIVELLE-DOT) 0.1 MG/24HR Place 1 patch onto the skin 2 (two) times a week.    . furosemide (LASIX) 40 MG tablet Take 40 mg by mouth daily.    Marland Kitchen gabapentin (NEURONTIN) 300 MG capsule Take 300 mg by mouth at bedtime.    Marland Kitchen  ramipril (ALTACE) 2.5 MG capsule Take 2.5 mg by mouth daily.     No current facility-administered medications for this visit.     PHYSICAL EXAMINATION:   Vitals:   09/03/17 0842  BP: (!) 151/81  Pulse: 87  Resp: 18  Temp: 97.8 F (36.6 C)  SpO2: 100%    Filed Weights   09/03/17 0842  Weight: 130 lb (59 kg)     Physical Exam Constitutional: Well-developed, well-nourished, and in no distress.   HENT:  Head: Normocephalic and atraumatic.  Mouth/Throat: No oropharyngeal exudate. Mucosa moist. Eyes: Pupils are equal, round, and reactive to light. Conjunctivae are normal. No scleral icterus.  Neck: Normal range of  motion. Neck supple. No JVD present.  Cardiovascular: Normal rate, regular rhythm and normal heart sounds.  Exam reveals no gallop and no friction rub.   No murmur heard. Pulmonary/Chest: Effort normal and breath sounds normal. No respiratory distress. No wheezes.No rales.  Abdominal: Soft. Bowel sounds are normal. No distension. There is no tenderness. There is no guarding.  Musculoskeletal: No edema or tenderness.  Lymphadenopathy:    No cervical or supraclavicular adenopathy.  Neurological: Alert and oriented to person, place, and time. No cranial nerve deficit.  Skin: Skin is warm and dry. No rash noted. No erythema. No pallor.  Psychiatric: Affect and judgment normal.     LABORATORY DATA: I have personally reviewed the data as listed:  Appointment on 09/03/2017  Component Date Value Ref Range Status  . WBC 09/03/2017 9.3  4.0 - 10.5 K/uL Final  . RBC 09/03/2017 4.17  3.87 - 5.11 MIL/uL Final  . Hemoglobin 09/03/2017 13.9  12.0 - 15.0 g/dL Final  . HCT 09/03/2017 43.1  36.0 - 46.0 % Final  . MCV 09/03/2017 103.4* 78.0 - 100.0 fL Final  . MCH 09/03/2017 33.3  26.0 - 34.0 pg Final  . MCHC 09/03/2017 32.3  30.0 - 36.0 g/dL Final  . RDW 09/03/2017 12.4  11.5 - 15.5 % Final  . Platelets 09/03/2017 369  150 - 400 K/uL Final  . Neutrophils Relative % 09/03/2017 55  % Final  . Neutro Abs 09/03/2017 5.2  1.7 - 7.7 K/uL Final  . Lymphocytes Relative 09/03/2017 31  % Final  . Lymphs Abs 09/03/2017 2.9  0.7 - 4.0 K/uL Final  . Monocytes Relative 09/03/2017 9  % Final  . Monocytes Absolute 09/03/2017 0.8  0.1 - 1.0 K/uL Final  . Eosinophils Relative 09/03/2017 4  % Final  . Eosinophils Absolute 09/03/2017 0.3  0.0 - 0.7 K/uL Final  . Basophils Relative 09/03/2017 1  % Final  . Basophils Absolute 09/03/2017 0.1  0.0 - 0.1 K/uL Final  . Sodium 09/03/2017 139  135 - 145 mmol/L Final  . Potassium 09/03/2017 3.4* 3.5 - 5.1 mmol/L Final  . Chloride 09/03/2017 102  101 - 111 mmol/L Final  .  CO2 09/03/2017 28  22 - 32 mmol/L Final  . Glucose, Bld 09/03/2017 91  65 - 99 mg/dL Final  . BUN 09/03/2017 12  6 - 20 mg/dL Final  . Creatinine, Ser 09/03/2017 0.86  0.44 - 1.00 mg/dL Final  . Calcium 09/03/2017 8.7* 8.9 - 10.3 mg/dL Final  . Total Protein 09/03/2017 7.0  6.5 - 8.1 g/dL Final  . Albumin 09/03/2017 4.1  3.5 - 5.0 g/dL Final  . AST 09/03/2017 20  15 - 41 U/L Final  . ALT 09/03/2017 16  14 - 54 U/L Final  . Alkaline Phosphatase 09/03/2017 97  38 -  126 U/L Final  . Total Bilirubin 09/03/2017 0.6  0.3 - 1.2 mg/dL Final  . GFR calc non Af Amer 09/03/2017 >60  >60 mL/min Final  . GFR calc Af Amer 09/03/2017 >60  >60 mL/min Final   Comment: (NOTE) The eGFR has been calculated using the CKD EPI equation. This calculation has not been validated in all clinical situations. eGFR's persistently <60 mL/min signify possible Chronic Kidney Disease.   . Anion gap 09/03/2017 9  5 - 15 Final    RADIOGRAPHIC STUDIES: I have personally reviewed the radiological images as listed and agree with the findings in the report  No results found.  ASSESSMENT: Chronic mild leukocytosis likely due to underlying chronic smoking. Mild thrombocytosis Macrocytosis  PLAN: -We will plan to rule out myeloproliferative disorders by checking Jak 2 mutation. -We will rule out CML by checking BCR-ABL gene mutation. -Repeat CBC, CMP. -Patient does have a macrocytosis, therefore will check vitamin B12 and folate levels. -He has a mild thrombocytosis as well with platelet count 410 K.  Therefore we will rule out iron deficiency as a cause of her mild thrombocytosis. -Return to clinic in 4 weeks for follow-up to review her lab workup results.  Orders Placed This Encounter  Procedures  . CBC with Differential    Standing Status:   Future    Number of Occurrences:   1    Standing Expiration Date:   09/03/2018  . Comprehensive metabolic panel    Standing Status:   Future    Number of Occurrences:    1    Standing Expiration Date:   09/03/2018  . JAK2 V617F, w Reflex to CALR/E12/MPL  . BCR-ABL1, CML/ALL, PCR, QUANT  . Vitamin B12    Standing Status:   Future    Number of Occurrences:   1    Standing Expiration Date:   09/03/2018  . Folate    Standing Status:   Future    Number of Occurrences:   1    Standing Expiration Date:   09/03/2018  . Iron and TIBC    Standing Status:   Future    Number of Occurrences:   1    Standing Expiration Date:   09/03/2018  . Ferritin    Standing Status:   Future    Number of Occurrences:   1    Standing Expiration Date:   09/03/2018    All questions were answered. The patient knows to call the clinic with any problems, questions or concerns.  This note was electronically signed.    Twana First, MD  09/03/2017 11:00 AM

## 2017-09-07 LAB — BCR-ABL1, CML/ALL, PCR, QUANT

## 2017-09-17 LAB — CALR + JAK2 E12-15 + MPL (REFLEXED)

## 2017-09-17 LAB — JAK2 V617F, W REFLEX TO CALR/E12/MPL

## 2017-09-26 ENCOUNTER — Ambulatory Visit (HOSPITAL_COMMUNITY): Payer: BC Managed Care – PPO

## 2017-09-27 ENCOUNTER — Ambulatory Visit (HOSPITAL_COMMUNITY): Payer: BC Managed Care – PPO

## 2018-06-10 ENCOUNTER — Encounter (HOSPITAL_COMMUNITY): Payer: Self-pay | Admitting: Emergency Medicine

## 2018-06-10 ENCOUNTER — Emergency Department (HOSPITAL_COMMUNITY): Payer: BC Managed Care – PPO

## 2018-06-10 ENCOUNTER — Emergency Department (HOSPITAL_COMMUNITY)
Admission: EM | Admit: 2018-06-10 | Discharge: 2018-06-10 | Disposition: A | Discharge status: Home / Self Care | Payer: BC Managed Care – PPO | Attending: Emergency Medicine | Admitting: Emergency Medicine

## 2018-06-10 DIAGNOSIS — J209 Acute bronchitis, unspecified: Secondary | ICD-10-CM | POA: Diagnosis not present

## 2018-06-10 DIAGNOSIS — Z79899 Other long term (current) drug therapy: Secondary | ICD-10-CM | POA: Insufficient documentation

## 2018-06-10 DIAGNOSIS — J440 Chronic obstructive pulmonary disease with acute lower respiratory infection: Secondary | ICD-10-CM | POA: Diagnosis not present

## 2018-06-10 DIAGNOSIS — I10 Essential (primary) hypertension: Secondary | ICD-10-CM | POA: Insufficient documentation

## 2018-06-10 DIAGNOSIS — R05 Cough: Secondary | ICD-10-CM | POA: Diagnosis present

## 2018-06-10 DIAGNOSIS — F17210 Nicotine dependence, cigarettes, uncomplicated: Secondary | ICD-10-CM | POA: Insufficient documentation

## 2018-06-10 DIAGNOSIS — Z85828 Personal history of other malignant neoplasm of skin: Secondary | ICD-10-CM | POA: Insufficient documentation

## 2018-06-10 MED ORDER — PREDNISONE 20 MG PO TABS
40.0000 mg | ORAL_TABLET | Freq: Once | ORAL | Status: AC
  Administered 2018-06-10: 40 mg via ORAL
  Filled 2018-06-10: qty 2

## 2018-06-10 MED ORDER — HYDROCODONE-ACETAMINOPHEN 5-325 MG PO TABS
2.0000 | ORAL_TABLET | Freq: Once | ORAL | Status: AC
  Administered 2018-06-10: 2 via ORAL
  Filled 2018-06-10: qty 2

## 2018-06-10 MED ORDER — ONDANSETRON HCL 4 MG PO TABS
4.0000 mg | ORAL_TABLET | Freq: Once | ORAL | Status: AC
  Administered 2018-06-10: 4 mg via ORAL
  Filled 2018-06-10: qty 1

## 2018-06-10 MED ORDER — DEXAMETHASONE 4 MG PO TABS: 4.0000 mg | ORAL_TABLET | Freq: Two times a day (BID) | ORAL | 0 refills | Status: DC

## 2018-06-10 MED ORDER — DOXYCYCLINE HYCLATE 100 MG PO CAPS: 100.0000 mg | ORAL_CAPSULE | Freq: Two times a day (BID) | ORAL | 0 refills | Status: DC

## 2018-06-10 MED ORDER — TRAMADOL HCL 50 MG PO TABS: ORAL_TABLET | ORAL | 0 refills | Status: DC

## 2018-06-10 MED ORDER — DOXYCYCLINE HYCLATE 100 MG PO TABS
100.0000 mg | ORAL_TABLET | Freq: Once | ORAL | Status: AC
  Administered 2018-06-10: 100 mg via ORAL
  Filled 2018-06-10: qty 1

## 2018-06-10 NOTE — ED Provider Notes (Signed)
[facility] EMERGENCY DEPARTMENT Provider Note   CSN: [id number] Arrival date & time: 06/10/18  1740     History   Chief Complaint Chief Complaint  Patient presents with  . Cough    HPI Carol Rowe is a 62 y.o. female.  Patient is a 62 year old female who presents to the emergency department with a complaint of cough.  The patient states she has had a productive cough with greenish looking phlegm over the last 2 to 3 days.  She has had pain of the right lower back.  She has had body aches and felt feverish.  She says she has not checked her temperature, but she is pretty sure she probably had one.  She has not been coughing up any blood.  She has not had any injury to the chest or lung area.  No recent operations or procedures.  The patient said that when she felt as though she may have had an infection, she started taking some residual Cipro that she had left from a previous problem, but she only had 4 tablets, and she says it did not help her feel any better.  The history is provided by the patient.  Cough  Associated symptoms include chills and myalgias. Pertinent negatives include no chest pain, no shortness of breath and no wheezing.    Past Medical History:  Diagnosis Date  . Arthritis   . Hypertension   . VIN III (vulvar intraepithelial neoplasia III)   . Vulvar lesion     Patient Active Problem List   Diagnosis Date Noted  . Leukocytosis 09/03/2017  . Thrombocytosis ([employer]) 09/03/2017  . Chest tightness or pressure 08/29/2012  . 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 CONSTIPATION, CHRONIC 01/05/2009  . NAUSEA, CHRONIC 01/05/2009  . EPIGASTRIC PAIN 01/05/2009  . MELANOMA 01/04/2009  . ANXIETY 01/04/2009  . CIGARETTE SMOKER 01/04/2009  . DEPRESSION 01/04/2009  . HYPERTENSION 01/04/2009  . GERD 01/04/2009  . ARTHRITIS 01/04/2009  . OSTEOPOROSIS 01/04/2009  . DIVERTICULITIS, HX OF 01/04/2009    Past Surgical History:  Procedure Laterality Date  . ABDOMINAL  HYSTERECTOMY  1989  . CESAREAN SECTION    . CHOLECYSTECTOMY    . skin cancer removal       OB History   None      Home Medications    Prior to Admission medications   Medication Sig Start Date End Date Taking? Authorizing Provider  ALPRAZolam Duanne Moron) 0.5 MG tablet Take 0.5 mg by mouth at bedtime as needed. 1/2 tablet daily for anxiety and to sleep    [provider]  diphenhydramine-acetaminophen (TYLENOL PM) 25-500 MG TABS Take 1 tablet by mouth at bedtime as needed.    [provider]  estradiol (VIVELLE-DOT) 0.1 MG/24HR Place 1 patch onto the skin 2 (two) times a week.    [provider]  folic acid (FOLVITE) 1 MG tablet Take 1 tablet (1 mg total) by mouth daily. 09/03/17   Twana First, MD  furosemide (LASIX) 40 MG tablet Take 40 mg by mouth daily.    [provider]  gabapentin (NEURONTIN) 300 MG capsule Take 300 mg by mouth at bedtime.    [provider]  ramipril (ALTACE) 2.5 MG capsule Take 2.5 mg by mouth daily.    [provider]    Family History Family History  Problem Relation Age of Onset  . Colon cancer Father   . Heart disease Father   . Breast cancer Mother   . Heart disease Maternal  Grandmother   . Diabetes Paternal Grandmother     Social History Social History   Tobacco Use  . Smoking status: Current Some Day Smoker    Packs/day: 0.50    Types: Cigarettes  . Smokeless tobacco: Never Used  Substance Use Topics  . Alcohol use: Yes    Comment: occ  . Drug use: No     Allergies   Macrobid [nitrofurantoin macrocrystal] and Mirtazapine   Review of Systems Review of Systems  Constitutional: Positive for chills, fatigue and fever. Negative for activity change.       All ROS Neg except as noted in HPI  HENT: Negative for nosebleeds.   Eyes: Negative for photophobia and discharge.  Respiratory: Positive for cough. Negative for shortness of breath and wheezing.        Chest wall soreness    Cardiovascular: Negative for chest pain and palpitations.  Gastrointestinal: Negative for abdominal pain and blood in stool.  Genitourinary: Negative for dysuria, frequency and hematuria.  Musculoskeletal: Positive for myalgias. Negative for arthralgias, back pain and neck pain.  Skin: Negative.   Neurological: Negative for dizziness, seizures and speech difficulty.  Psychiatric/Behavioral: Negative for confusion and hallucinations.     Physical Exam Updated Vital Signs BP (!) 157/101 (BP Location: Left Arm)   Pulse (!) 104   Temp 98.6 F (37 C) (Oral)   Resp 17   Ht 5\' 3"  (1.6 m)   Wt 59 kg   SpO2 99%   BMI 23.03 kg/m   Physical Exam  Constitutional: She is oriented to person, place, and time. She appears well-developed and well-nourished.  Non-toxic appearance.  HENT:  Head: Normocephalic.  Right Ear: Tympanic membrane and external ear normal.  Left Ear: Tympanic membrane and external ear normal.  Eyes: Pupils are equal, round, and reactive to light. EOM and lids are normal.  Neck: Normal range of motion. Neck supple. Carotid bruit is not present.  Cardiovascular: Normal rate, regular rhythm, normal heart sounds, intact distal pulses and normal pulses.  Pulmonary/Chest: Breath sounds normal. No respiratory distress.  There is symmetrical rise and fall of the chest.  The patient speaks in complete sentences without problem.  Coarse breath sounds noted throughout the lung fields.  Abdominal: Soft. Bowel sounds are normal. There is no tenderness. There is no guarding.  Musculoskeletal: Normal range of motion. She exhibits no edema or tenderness.  Negative Homans sign.  Lymphadenopathy:       Head (right side): No submandibular adenopathy present.       Head (left side): No submandibular adenopathy present.    She has no cervical adenopathy.  Neurological: She is alert and oriented to person, place, and time. She has normal strength. No cranial nerve deficit or sensory  deficit.  Skin: Skin is warm and dry.  Psychiatric: She has a normal mood and affect. Her speech is normal.  Nursing note and vitals reviewed.    ED Treatments / Results  Labs (all labs ordered are listed, but only abnormal results are displayed) Labs Reviewed - No data to display  EKG None  Radiology No results found.  Procedures Procedures (including critical care time)  Medications Ordered in ED Medications  HYDROcodone-acetaminophen (NORCO/VICODIN) 5-325 MG per tablet 2 tablet (2 tablets Oral Given 06/10/18 1048)  ondansetron (ZOFRAN) tablet 4 mg (4 mg Oral Given 06/10/18 1049)     Initial Impression / Assessment and Plan / ED Course  I have reviewed the triage vital signs and the nursing notes.  Pertinent labs & imaging results that were available during my care of the patient were reviewed by me and considered in my medical decision making (see chart for details).       Final Clinical Impressions(s) / ED Diagnoses MDM  Vital signs within normal limits.  Pulse oximetry is 96% on room air.  Within normal limits by my interpretation.  Patient presents with cough that seems to be getting worse, accompanied by right lower back pain.  Patient treated with hydrocodone here in the emergency department.  Chest x-ray reveals chronic obstructive pulmonary disease.  No active cardiopulmonary disease.  No pneumothorax or pleural effusion.  I discussed these findings with the patient in terms of which she understands.  The patient speaks in complete sentences without problem at discharge.  The patient will be covered with antibiotics, and steroids.  Patient is to follow-up with the primary physician Dr. Nevada Crane for recheck this week.  Patient is in agreement with this plan.   Final diagnoses:  COPD (chronic obstructive pulmonary disease) with acute bronchitis [facility])    ED Discharge Orders         Ordered    dexamethasone (DECADRON) 4 MG tablet  2 times daily with meals      06/10/18 1156    traMADol (ULTRAM) 50 MG tablet     06/10/18 1156    doxycycline (VIBRAMYCIN) 100 MG capsule  2 times daily     06/10/18 1156           Lily Kocher, PA-C 06/10/18 2148    Fredia Sorrow, MD 06/18/18 [phone]

## 2018-06-10 NOTE — ED Notes (Signed)
Patient transported to X-ray 

## 2018-06-10 NOTE — Discharge Instructions (Addendum)
Your oxygen level is within normal limits.  Your chest x-ray shows chronic obstructive pulmonary disease.  Your examination favors bronchitis.  Please use doxycycline 2 times daily with food.  Please use Decadron 2 times daily with food.  Continue your breathing treatments.  Please stop smoking.  Use Tylenol extra strength for mild to moderate discomfort.  Use Ultram for more severe pain.  Ultram may cause drowsiness, please use this medication with caution.  Please see Dr. Nevada Crane in the office for follow-up of your bronchitis event.

## 2018-06-10 NOTE — ED Triage Notes (Signed)
Pt reports productive cough with green sputum started on Saturday with pain in right lower back.  Has felt feverish, but has not checked temp.

## 2019-03-01 ENCOUNTER — Other Ambulatory Visit: Payer: Self-pay | Admitting: Internal Medicine

## 2019-03-01 DIAGNOSIS — R1011 Right upper quadrant pain: Secondary | ICD-10-CM

## 2019-03-04 ENCOUNTER — Other Ambulatory Visit: Payer: Self-pay

## 2019-03-04 ENCOUNTER — Ambulatory Visit (HOSPITAL_COMMUNITY)
Admission: RE | Admit: 2019-03-04 | Discharge: 2019-03-04 | Disposition: A | Discharge status: Home / Self Care | Payer: BC Managed Care – PPO | Source: Ambulatory Visit | Attending: Internal Medicine | Admitting: Internal Medicine

## 2019-03-04 DIAGNOSIS — R1011 Right upper quadrant pain: Secondary | ICD-10-CM | POA: Insufficient documentation

## 2019-08-27 ENCOUNTER — Encounter (INDEPENDENT_AMBULATORY_CARE_PROVIDER_SITE_OTHER): Payer: Self-pay | Admitting: *Deleted

## 2019-09-29 ENCOUNTER — Other Ambulatory Visit (INDEPENDENT_AMBULATORY_CARE_PROVIDER_SITE_OTHER): Payer: Self-pay | Admitting: *Deleted

## 2019-09-29 DIAGNOSIS — Z1211 Encounter for screening for malignant neoplasm of colon: Secondary | ICD-10-CM

## 2019-09-29 DIAGNOSIS — Z800 Family history of malignant neoplasm of digestive organs: Secondary | ICD-10-CM | POA: Insufficient documentation

## 2019-10-14 ENCOUNTER — Ambulatory Visit: Payer: BC Managed Care – PPO | Admitting: Orthopaedic Surgery

## 2019-10-14 ENCOUNTER — Encounter: Payer: Self-pay | Admitting: Orthopaedic Surgery

## 2019-10-14 ENCOUNTER — Ambulatory Visit: Payer: BC Managed Care – PPO

## 2019-10-14 ENCOUNTER — Other Ambulatory Visit: Payer: Self-pay

## 2019-10-14 VITALS — BP 124/94 | HR 114 | Temp 97.2°F | Ht 63.0 in | Wt 133.0 lb

## 2019-10-14 DIAGNOSIS — G8929 Other chronic pain: Secondary | ICD-10-CM

## 2019-10-14 DIAGNOSIS — M25561 Pain in right knee: Secondary | ICD-10-CM

## 2019-10-14 DIAGNOSIS — F17210 Nicotine dependence, cigarettes, uncomplicated: Secondary | ICD-10-CM

## 2019-10-14 NOTE — Progress Notes (Signed)
Subjective:    Patient ID: Carol Rowe, female    DOB: 30-Aug-1956, 64 y.o.   MRN: [id number]  HPI She has had pain in the right knee slowly getting worse since mid 2019.  She has swelling and popping.  She has more medial pain.  She has had giving way. She has no redness, no trauma. She has seen Dr. Dwana Melena and has had injection which helped.  Recently the injection did not help.  She has not had x-rays.  I have reviewed Dr. Scharlene Gloss notes.  She has no other joint pain, no numbness or redness.   Review of Systems  Constitutional: Positive for activity change.  Musculoskeletal: Positive for arthralgias, gait problem and joint swelling.  All other systems reviewed and are negative.  For Review of Systems, all other systems reviewed and are negative.  The following is a summary of the past history medically, past history surgically, known current medicines, social history and family history.  This information is gathered electronically by the computer from prior information and documentation.  I review this each visit and have found including this information at this point in the chart is beneficial and informative.   Past Medical History:  Diagnosis Date  . Arthritis   . Hypertension   . VIN III (vulvar intraepithelial neoplasia III)   . Vulvar lesion     Past Surgical History:  Procedure Laterality Date  . ABDOMINAL HYSTERECTOMY  1989  . CESAREAN SECTION    . CHOLECYSTECTOMY    . skin cancer removal      Current Outpatient Medications on File Prior to Visit  Medication Sig Dispense Refill  . ALPRAZolam (XANAX) 0.5 MG tablet Take 0.5 mg by mouth at bedtime as needed. 1/2 tablet daily for anxiety and to sleep    . dexamethasone (DECADRON) 4 MG tablet Take 1 tablet (4 mg total) by mouth 2 (two) times daily with a meal. 10 tablet 0  . diphenhydramine-acetaminophen (TYLENOL PM) 25-500 MG TABS Take 1 tablet by mouth at bedtime as needed.    . doxycycline (VIBRAMYCIN) 100 MG  capsule Take 1 capsule (100 mg total) by mouth 2 (two) times daily. 14 capsule 0  . estradiol (VIVELLE-DOT) 0.1 MG/24HR Place 1 patch onto the skin 2 (two) times a week.    . folic acid (FOLVITE) 1 MG tablet Take 1 tablet (1 mg total) by mouth daily. 60 tablet 1  . furosemide (LASIX) 40 MG tablet Take 40 mg by mouth daily.    Marland Kitchen gabapentin (NEURONTIN) 300 MG capsule Take 300 mg by mouth at bedtime.    . ramipril (ALTACE) 2.5 MG capsule Take 2.5 mg by mouth daily.    . traMADol (ULTRAM) 50 MG tablet 1 or 2 po q6h prn pain 12 tablet 0   No current facility-administered medications on file prior to visit.    Social History   Socioeconomic History  . Marital status: Widowed    Spouse name: Not on file  . Number of children: Not on file  . Years of education: Not on file  . Highest education level: Not on file  Occupational History  . Not on file  Tobacco Use  . Smoking status: Current Some Day Smoker    Packs/day: 0.50    Types: Cigarettes  . Smokeless tobacco: Never Used  Substance and Sexual Activity  . Alcohol use: Yes    Comment: occ  . Drug use: No  . Sexual activity: Yes    Birth  control/protection: Surgical  Other Topics Concern  . Not on file  Social History Narrative  . Not on file   Social Determinants of Health   Financial Resource Strain:   . Difficulty of Paying Living Expenses: Not on file  Food Insecurity:   . Worried About Programme researcher, broadcasting/film/video in the Last Year: Not on file  . Ran Out of Food in the Last Year: Not on file  Transportation Needs:   . Lack of Transportation (Medical): Not on file  . Lack of Transportation (Non-Medical): Not on file  Physical Activity:   . Days of Exercise per Week: Not on file  . Minutes of Exercise per Session: Not on file  Stress:   . Feeling of Stress : Not on file  Social Connections:   . Frequency of Communication with Friends and Family: Not on file  . Frequency of Social Gatherings with Friends and Family: Not on  file  . Attends Religious Services: Not on file  . Active Member of Clubs or Organizations: Not on file  . Attends Banker Meetings: Not on file  . Marital Status: Not on file  Intimate Partner Violence:   . Fear of Current or Ex-Partner: Not on file  . Emotionally Abused: Not on file  . Physically Abused: Not on file  . Sexually Abused: Not on file    Family History  Problem Relation Age of Onset  . Colon cancer Father   . Heart disease Father   . Breast cancer Mother   . Heart disease Maternal Grandmother   . Diabetes Paternal Grandmother     Temp (!) 97.2 F (36.2 C)   Ht 5\' 3"  (1.6 m)   Wt 133 lb (60.3 kg)   BMI 23.56 kg/m   Body mass index is 23.56 kg/m.      Objective:   Physical Exam Vitals and nursing note reviewed.  Constitutional:      Appearance: She is well-developed.  HENT:     Head: Normocephalic and atraumatic.  Eyes:     Conjunctiva/sclera: Conjunctivae normal.     Pupils: Pupils are equal, round, and reactive to light.  Cardiovascular:     Rate and Rhythm: Normal rate and regular rhythm.  Pulmonary:     Effort: Pulmonary effort is normal.  Abdominal:     Palpations: Abdomen is soft.  Musculoskeletal:     Cervical back: Normal range of motion and neck supple.       Legs:  Skin:    General: Skin is warm and dry.  Neurological:     Mental Status: She is alert and oriented to person, place, and time.     Cranial Nerves: No cranial nerve deficit.     Motor: No abnormal muscle tone.     Coordination: Coordination normal.     Deep Tendon Reflexes: Reflexes are normal and symmetric. Reflexes normal.  Psychiatric:        Behavior: Behavior normal.        Thought Content: Thought content normal.        Judgment: Judgment normal.    X-rays were done of the right knee, reported separately.       Assessment & Plan:   Encounter Diagnoses  Name Primary?  . Chronic pain of right knee Yes  . Nicotine dependence, cigarettes,  uncomplicated    PROCEDURE NOTE:  The patient requests injections of the right knee , verbal consent was obtained.  The right knee was prepped appropriately after  time out was performed.   Sterile technique was observed and injection of 1 cc of Depo-Medrol 40 mg with several cc's of plain xylocaine. Anesthesia was provided by ethyl chloride and a 20-gauge needle was used to inject the knee area. The injection was tolerated well.  A band aid dressing was applied.  The patient was advised to apply ice later today and tomorrow to the injection sight as needed.  I am concerned about meniscus tear medially.  MRI ordered.  Return in two weeks after MRI.  Call if any problem.  Precautions discussed.   Electronically Signed Darreld Mclean, MD 1/5/20219:47 AM

## 2019-10-14 NOTE — Patient Instructions (Signed)

## 2019-10-21 ENCOUNTER — Ambulatory Visit (HOSPITAL_COMMUNITY): Payer: BC Managed Care – PPO

## 2019-10-30 ENCOUNTER — Ambulatory Visit: Payer: BC Managed Care – PPO | Admitting: Orthopaedic Surgery

## 2019-11-11 ENCOUNTER — Telehealth (INDEPENDENT_AMBULATORY_CARE_PROVIDER_SITE_OTHER): Payer: Self-pay | Admitting: *Deleted

## 2019-11-11 ENCOUNTER — Ambulatory Visit (INDEPENDENT_AMBULATORY_CARE_PROVIDER_SITE_OTHER): Payer: Self-pay

## 2019-11-11 ENCOUNTER — Other Ambulatory Visit (INDEPENDENT_AMBULATORY_CARE_PROVIDER_SITE_OTHER): Payer: Self-pay | Admitting: *Deleted

## 2019-11-11 ENCOUNTER — Other Ambulatory Visit: Payer: Self-pay

## 2019-11-11 ENCOUNTER — Encounter (INDEPENDENT_AMBULATORY_CARE_PROVIDER_SITE_OTHER): Payer: Self-pay | Admitting: *Deleted

## 2019-11-11 MED ORDER — SUPREP BOWEL PREP KIT 17.5-3.13-1.6 GM/177ML PO SOLN: 1.0000 | Freq: Once | ORAL | 0 refills | Status: AC

## 2019-11-11 NOTE — Telephone Encounter (Signed)
Patient needs suprep (copay card) TCS sch'd 3/3

## 2019-11-11 NOTE — Telephone Encounter (Signed)
Referring MD/PCP: hall   Procedure: tcs  Reason/Indication:  Screening, fam hx colon cancer  Has patient had this procedure before?  Yes, 2010  If so, when, by whom and where?    Is there a family history of colon cancer?  Yes, father  Who?  What age when diagnosed?    Is patient diabetic?   no      Does patient have prosthetic heart valve or mechanical valve?  no  Do you have a pacemaker/defibrillator?  no  Has patient ever had endocarditis/atrial fibrillation? no  Does patient use oxygen? no  Has patient had joint replacement within last 12 months?  no  Is patient constipated or do they take laxatives? no  Does patient have a history of alcohol/drug use?  no  Is patient on blood thinner such as Coumadin, Plavix and/or Aspirin? no  Medications: altace 2.5 mg daily, lasix 40 mg daily, protonix 40 mg daily, potassium 10 mg daily, gabapentin 300 mg daily, valtrex 1 mg daily, xanax 0.5 mg daily  Allergies: nkda  Medication Adjustment per Dr Laural Golden:   Procedure date & time: 12/10/19 at 730

## 2019-12-01 NOTE — Telephone Encounter (Signed)
Colonoscopy with conscious sedation 

## 2019-12-08 ENCOUNTER — Other Ambulatory Visit: Payer: Self-pay

## 2019-12-08 ENCOUNTER — Other Ambulatory Visit (HOSPITAL_COMMUNITY)
Admission: RE | Admit: 2019-12-08 | Discharge: 2019-12-08 | Disposition: A | Discharge status: Home / Self Care | Payer: BC Managed Care – PPO | Source: Ambulatory Visit | Attending: Internal Medicine | Admitting: Internal Medicine

## 2019-12-08 DIAGNOSIS — Z20822 Contact with and (suspected) exposure to covid-19: Secondary | ICD-10-CM | POA: Diagnosis not present

## 2019-12-08 DIAGNOSIS — Z01812 Encounter for preprocedural laboratory examination: Secondary | ICD-10-CM | POA: Diagnosis not present

## 2019-12-08 LAB — SARS CORONAVIRUS 2 (TAT 6-24 HRS): SARS Coronavirus 2: NEGATIVE

## 2019-12-10 ENCOUNTER — Encounter (HOSPITAL_COMMUNITY): Payer: Self-pay | Admitting: Internal Medicine

## 2019-12-10 ENCOUNTER — Encounter (HOSPITAL_COMMUNITY)
Admission: RE | Disposition: A | Discharge status: Home / Self Care | Payer: Self-pay | Source: Home / Self Care | Attending: Internal Medicine

## 2019-12-10 ENCOUNTER — Other Ambulatory Visit: Payer: Self-pay

## 2019-12-10 ENCOUNTER — Ambulatory Visit (HOSPITAL_COMMUNITY)
Admission: RE | Admit: 2019-12-10 | Discharge: 2019-12-10 | Disposition: A | Discharge status: Home / Self Care | Payer: BC Managed Care – PPO | Attending: Internal Medicine | Admitting: Internal Medicine

## 2019-12-10 DIAGNOSIS — Z1211 Encounter for screening for malignant neoplasm of colon: Secondary | ICD-10-CM

## 2019-12-10 DIAGNOSIS — M1990 Unspecified osteoarthritis, unspecified site: Secondary | ICD-10-CM | POA: Diagnosis not present

## 2019-12-10 DIAGNOSIS — Z7982 Long term (current) use of aspirin: Secondary | ICD-10-CM | POA: Diagnosis not present

## 2019-12-10 DIAGNOSIS — D123 Benign neoplasm of transverse colon: Secondary | ICD-10-CM | POA: Insufficient documentation

## 2019-12-10 DIAGNOSIS — Z881 Allergy status to other antibiotic agents status: Secondary | ICD-10-CM | POA: Insufficient documentation

## 2019-12-10 DIAGNOSIS — K219 Gastro-esophageal reflux disease without esophagitis: Secondary | ICD-10-CM | POA: Insufficient documentation

## 2019-12-10 DIAGNOSIS — I10 Essential (primary) hypertension: Secondary | ICD-10-CM | POA: Diagnosis not present

## 2019-12-10 DIAGNOSIS — K5730 Diverticulosis of large intestine without perforation or abscess without bleeding: Secondary | ICD-10-CM | POA: Insufficient documentation

## 2019-12-10 DIAGNOSIS — K6289 Other specified diseases of anus and rectum: Secondary | ICD-10-CM

## 2019-12-10 DIAGNOSIS — F17210 Nicotine dependence, cigarettes, uncomplicated: Secondary | ICD-10-CM | POA: Diagnosis not present

## 2019-12-10 DIAGNOSIS — F419 Anxiety disorder, unspecified: Secondary | ICD-10-CM | POA: Diagnosis not present

## 2019-12-10 DIAGNOSIS — Z800 Family history of malignant neoplasm of digestive organs: Secondary | ICD-10-CM | POA: Insufficient documentation

## 2019-12-10 DIAGNOSIS — Z8249 Family history of ischemic heart disease and other diseases of the circulatory system: Secondary | ICD-10-CM | POA: Diagnosis not present

## 2019-12-10 DIAGNOSIS — J45909 Unspecified asthma, uncomplicated: Secondary | ICD-10-CM | POA: Insufficient documentation

## 2019-12-10 DIAGNOSIS — Z79899 Other long term (current) drug therapy: Secondary | ICD-10-CM | POA: Insufficient documentation

## 2019-12-10 DIAGNOSIS — K644 Residual hemorrhoidal skin tags: Secondary | ICD-10-CM

## 2019-12-10 HISTORY — PX: POLYPECTOMY: SHX5525

## 2019-12-10 HISTORY — PX: COLONOSCOPY: SHX5424

## 2019-12-10 SURGERY — COLONOSCOPY
Anesthesia: Moderate Sedation

## 2019-12-10 MED ORDER — MEPERIDINE HCL 50 MG/ML IJ SOLN
INTRAMUSCULAR | Status: AC
  Filled 2019-12-10: qty 1

## 2019-12-10 MED ORDER — MIDAZOLAM HCL 5 MG/5ML IJ SOLN
INTRAMUSCULAR | Status: DC | PRN
  Administered 2019-12-10 (×2): 2 mg via INTRAVENOUS
  Administered 2019-12-10: 1 mg via INTRAVENOUS
  Administered 2019-12-10: 3 mg via INTRAVENOUS
  Administered 2019-12-10: 2 mg via INTRAVENOUS

## 2019-12-10 MED ORDER — BENEFIBER DRINK MIX PO PACK: 4.0000 g | PACK | Freq: Every day | ORAL | Status: DC

## 2019-12-10 MED ORDER — SODIUM CHLORIDE 0.9 % IV SOLN: INTRAVENOUS | Status: DC

## 2019-12-10 MED ORDER — STERILE WATER FOR IRRIGATION IR SOLN
Status: DC | PRN
  Administered 2019-12-10: 08:00:00 1.5 mL

## 2019-12-10 MED ORDER — MIDAZOLAM HCL 5 MG/5ML IJ SOLN
INTRAMUSCULAR | Status: AC
  Filled 2019-12-10: qty 10

## 2019-12-10 MED ORDER — MEPERIDINE HCL 50 MG/ML IJ SOLN
INTRAMUSCULAR | Status: DC | PRN
  Administered 2019-12-10 (×4): 25 mg via INTRAVENOUS

## 2019-12-10 NOTE — H&P (Addendum)
Carol Rowe is an 64 y.o. female.   Chief Complaint: Patient is here for colonoscopy. HPI: Patient is 64 year old Caucasian female who is here for high rescreening colonoscopy.  Last exam was in 2010.  She had diverticulosis.  She denies abdominal pain change in bowel habits or rectal bleeding.  She takes aspirin on as-needed basis for headache.  This is not very often. Family history significant for CRC in father who was 20 and died within few months of diagnosis.  Past Medical History:  Diagnosis Date  . Arthritis   . Hypertension   . VIN III (vulvar intraepithelial neoplasia III)   . Vulvar lesion        Osteoporosis       Anxiety        GERD       Bronchial asthma   Past Surgical History:  Procedure Laterality Date  . ABDOMINAL HYSTERECTOMY  1989  . CESAREAN SECTION    . CHOLECYSTECTOMY    . skin cancer removal      Family History  Problem Relation Age of Onset  . Colon cancer Father   . Heart disease Father   . Breast cancer Mother   . Heart disease Maternal Grandmother   . Diabetes Paternal Grandmother    Social History:  reports that she has been smoking cigarettes. She has been smoking about 0.50 packs per day. She has never used smokeless tobacco. She reports current alcohol use. She reports that she does not use drugs.  Allergies:  Allergies  Allergen Reactions  . Macrobid [Nitrofurantoin Macrocrystal] Hives  . Mirtazapine Swelling    Medications Prior to Admission  Medication Sig Dispense Refill  . albuterol (VENTOLIN HFA) 108 (90 Base) MCG/ACT inhaler Inhale 2 puffs into the lungs 2 (two) times daily as needed for shortness of breath.    . ALPRAZolam (XANAX) 0.5 MG tablet Take 0.5 mg by mouth 3 (three) times daily as needed for anxiety.     Marland Kitchen ascorbic acid (VITAMIN C) 500 MG tablet Take 500 mg by mouth every other day.    Marland Kitchen aspirin EC 325 MG tablet Take 325 mg by mouth daily as needed (headaches).    . B Complex Vitamins (B COMPLEX PO) Take 1 Dose by  mouth every other day. Liquid b complex    . cetirizine (ZYRTEC) 10 MG tablet Take 10 mg by mouth daily as needed for allergies.    . Cholecalciferol (DIALYVITE VITAMIN D 5000) 125 MCG (5000 UT) capsule Take 5,000 Units by mouth every other day.    . estradiol (VIVELLE-DOT) 0.1 MG/24HR Place 1 patch onto the skin 2 (two) times a week.    . fluticasone (FLONASE) 50 MCG/ACT nasal spray Place 2 sprays into both nostrils daily.    Marland Kitchen gabapentin (NEURONTIN) 300 MG capsule Take 300 mg by mouth at bedtime.    . Glycerin-Polysorbate 80 (REFRESH DRY EYE THERAPY OP) Place 1 drop into both eyes daily as needed (dry eyes).    Marland Kitchen levocetirizine (XYZAL) 5 MG tablet Take 5 mg by mouth at bedtime.    . Magnesium 400 MG CAPS Take 400 mg by mouth every other day.    . Menthol-Methyl Salicylate (SALONPAS PAIN RELIEF PATCH EX) Apply 1-3 patches topically daily as needed (pain).    . pantoprazole (PROTONIX) 40 MG tablet Take 40 mg by mouth See admin instructions. Take 40 mg daily may take a second 40 mg dose as needed for heartburn    . potassium chloride (KLOR-CON)  10 MEQ tablet Take 10 mEq by mouth daily.    . Probiotic CAPS Take 1 capsule by mouth every other day.    . ramipril (ALTACE) 2.5 MG capsule Take 2.5 mg by mouth daily.    Marland Kitchen torsemide (DEMADEX) 20 MG tablet Take 20-40 mg by mouth See admin instructions. Take 20 or 40 mg once daily in the morning    . trolamine salicylate (ASPERCREME) 10 % cream Apply 1 application topically as needed for muscle pain.    . Turmeric Curcumin 500 MG CAPS Take 500 mg by mouth every other day.    . valACYclovir (VALTREX) 1000 MG tablet Take 500-1,000 mg by mouth See admin instructions. Take 500 mg daily, may take 1000 mg instead if currently breaking out    . vitamin E 180 MG (400 UNITS) capsule Take 400 Units by mouth every other day.    . Zinc 50 MG CAPS Take 50 mg by mouth every other day.    Marland Kitchen acetaminophen (TYLENOL) 500 MG tablet Take 1,000 mg by mouth every 6 (six) hours  as needed for moderate pain.    . folic acid (FOLVITE) 1 MG tablet Take 1 tablet (1 mg total) by mouth daily. (Patient not taking: Reported on 12/03/2019) 60 tablet 1  . OVER THE COUNTER MEDICATION Take 6 tablets by mouth daily as needed (constipation). Tam herbal laxative      No results found for this or any previous visit (from the past 48 hour(s)). No results found.  Review of Systems  Blood pressure 130/82, pulse (!) 110, temperature 98.1 F (36.7 C), temperature source Oral, resp. rate 18, height 5\' 3"  (1.6 m), SpO2 100 %. Physical Exam  Constitutional: She appears well-developed and well-nourished.  HENT:  Mouth/Throat: Oropharynx is clear and moist.  Eyes: Conjunctivae are normal.  Neck: No thyromegaly present.  Cardiovascular: Normal rate, regular rhythm and normal heart sounds.  No murmur heard. Respiratory: Effort normal and breath sounds normal.  GI:  She has Pfannenstiel scar.  Abdomen is soft and nontender with organomegaly or masses.  Musculoskeletal:        General: No edema.  Lymphadenopathy:    She has no cervical adenopathy.  Neurological: She is alert.  Skin: Skin is warm and dry.     Assessment/Plan High risk screening colonoscopy. Family history of CRC and first-degree relative at age 19.  Hildred Laser, MD 12/10/2019, 7:32 AM

## 2019-12-10 NOTE — Op Note (Signed)
[facility] Patient Name: Carol Rowe Procedure Date: 12/10/2019 7:08 AM MRN: [id number] Date of Birth: 06-30-1956 Attending MD: Lionel December , MD CSN: [id number] Age: 64 Admit Type: Outpatient Procedure:                Colonoscopy Indications:              Screening in patient at increased risk: Colorectal                            cancer in father before age 76 Providers:                Lionel December, MD, Loma Messing B. Patsy Lager, RN, Pandora Leiter, Technician Referring MD:             Catalina Pizza, MD Medicines:                Meperidine 100 mg IV, Midazolam 10 mg IV Complications:            No immediate complications. Estimated Blood Loss:     Estimated blood loss was minimal. Procedure:                Pre-Anesthesia Assessment:                           - Prior to the procedure, a History and Physical                            was performed, and patient medications and                            allergies were reviewed. The patient's tolerance of                            previous anesthesia was also reviewed. The risks                            and benefits of the procedure and the sedation                            options and risks were discussed with the patient.                            All questions were answered, and informed consent                            was obtained. Prior Anticoagulants: The patient has                            taken no previous anticoagulant or antiplatelet                            agents except for aspirin. ASA Grade Assessment:  III - A patient with severe systemic disease. After                            reviewing the risks and benefits, the patient was                            deemed in satisfactory condition to undergo the                            procedure.                           After obtaining informed consent, the colonoscope                            was passed under  direct vision. Throughout the                            procedure, the patient's blood pressure, pulse, and                            oxygen saturations were monitored continuously. The                            [id number]-[id number] ([id number]) scope was introduced through                            the anus and advanced to the the cecum, identified                            by appendiceal orifice and ileocecal valve. The                            colonoscopy was somewhat difficult due to                            restricted mobility of the colon. Successful                            completion of the procedure was aided by                            withdrawing and reinserting the scope and scope                            guide. The patient tolerated the procedure well.                            The quality of the bowel preparation was good. The                            ileocecal valve, appendiceal orifice, and rectum  were photographed. Scope In: 7:45:08 AM Scope Out: 8:08:05 AM Scope Withdrawal Time: 0 hours 11 minutes 25 seconds  Total Procedure Duration: 0 hours 22 minutes 57 seconds  Findings:      Skin tags were found on perianal exam.      Scattered diverticula were found in the sigmoid colon, ascending colon       and cecum.      A small polyp was found in the splenic flexure. Biopsies were taken with       a cold forceps for histology.      External hemorrhoids were found during retroflexion. The hemorrhoids       were small.      Anal papilla(e) were hypertrophied. Impression:               - Perianal skin tags found on perianal exam.                           - Diverticulosis in the sigmoid colon, in the                            ascending colon and in the cecum.                           - One small polyp at the splenic flexure. Biopsied.                           - External hemorrhoids.                           - Anal papilla(e) were  hypertrophied. Moderate Sedation:      Moderate (conscious) sedation was administered by the endoscopy nurse       and supervised by the endoscopist. The following parameters were       monitored: oxygen saturation, heart rate, blood pressure, CO2       capnography and response to care. Total physician intraservice time was       29 minutes. Recommendation:           - Patient has a contact number available for                            emergencies. The signs and symptoms of potential                            delayed complications were discussed with the                            patient. Return to normal activities tomorrow.                            Written discharge instructions were provided to the                            patient.                           - High fiber diet today.                           -  Continue present medications.                           - No aspirin, ibuprofen, naproxen, or other                            non-steroidal anti-inflammatory drugs for 1 day.                           - Await pathology results.                           - Repeat colonoscopy in 5 years. Procedure Code(s):        --- Professional ---                           [phone], Colonoscopy, flexible; with biopsy, single                            or multiple                           99153, Moderate sedation; each additional 15                            minutes intraservice time                           G0500, Moderate sedation services provided by the                            same physician or other qualified health care                            professional performing a gastrointestinal                            endoscopic service that sedation supports,                            requiring the presence of an independent trained                            observer to assist in the monitoring of the                            patient's level of consciousness and physiological                             status; initial 15 minutes of intra-service time;                            patient age 80 years or older (additional time may                            be reported with 60454,  as appropriate) Diagnosis Code(s):        --- Professional ---                           K64.4, Residual hemorrhoidal skin tags                           Z80.0, Family history of malignant neoplasm of                            digestive organs                           K63.5, Polyp of colon                           K62.89, Other specified diseases of anus and rectum                           K57.30, Diverticulosis of large intestine without                            perforation or abscess without bleeding CPT copyright 2019 [employer]. All rights reserved. The codes documented in this report are preliminary and upon coder review may  be revised to meet current compliance requirements. Lionel December, MD Lionel December, MD 12/10/2019 8:19:13 AM This report has been signed electronically. Number of Addenda: 0

## 2019-12-10 NOTE — Discharge Instructions (Signed)
No aspirin or NSAIDs for 24 hours. Resume other medications as before. Benefiber or equivalent 4 g by mouth daily at bedtime. High-fiber diet. No driving for 24 hours. Physician will call with biopsy results. Next colonoscopy in 5 years.   Colonoscopy, Adult, Care After This sheet gives you information about how to care for yourself after your procedure. Your doctor may also give you more specific instructions. If you have problems or questions, call your doctor. What can I expect after the procedure? After the procedure, it is common to have:  A small amount of blood in your poop (stool) for 24 hours.  Some gas.  Mild cramping or bloating in your belly (abdomen). Follow these instructions at home: Eating and drinking   Drink enough fluid to keep your pee (urine) pale yellow.  Follow instructions from your doctor about what you cannot eat or drink.  Return to your normal diet as told by your doctor. Avoid heavy or fried foods that are hard to digest. Activity  Rest as told by your doctor.  Do not sit for a long time without moving. Get up to take short walks every 1-2 hours. This is important. Ask for help if you feel weak or unsteady.  Return to your normal activities as told by your doctor. Ask your doctor what activities are safe for you. To help cramping and bloating:   Try walking around.  Put heat on your belly as told by your doctor. Use the heat source that your doctor recommends, such as a moist heat pack or a heating pad. ? Put a towel between your skin and the heat source. ? Leave the heat on for 20-30 minutes. ? Remove the heat if your skin turns bright red. This is very important if you are unable to feel pain, heat, or cold. You may have a greater risk of getting burned. General instructions  For the first 24 hours after the procedure: ? Do not drive or use machinery. ? Do not sign important documents. ? Do not drink alcohol. ? Do your daily activities  more slowly than normal. ? Eat foods that are soft and easy to digest.  Take over-the-counter or prescription medicines only as told by your doctor.  Keep all follow-up visits as told by your doctor. This is important. Contact a doctor if:  You have blood in your poop 2-3 days after the procedure. Get help right away if:  You have more than a small amount of blood in your poop.  You see large clumps of tissue (blood clots) in your poop.  Your belly is swollen.  You feel like you may vomit (nauseous).  You vomit.  You have a fever.  You have belly pain that gets worse, and medicine does not help your pain. Summary  After the procedure, it is common to have a small amount of blood in your poop. You may also have mild cramping and bloating in your belly.  For the first 24 hours after the procedure, do not drive or use machinery, do not sign important documents, and do not drink alcohol.  Get help right away if you have a lot of blood in your poop, feel like you may vomit, have a fever, or have more belly pain. This information is not intended to replace advice given to you by your health care provider. Make sure you discuss any questions you have with your health care provider. Document Revised: 04/21/2019 Document Reviewed: 04/21/2019 Elsevier Patient Education  [phone]  [employer]  Hemorrhoids Hemorrhoids are swollen veins in and around the rectum or anus. There are two types of hemorrhoids:  Internal hemorrhoids. These occur in the veins that are just inside the rectum. They may poke through to the outside and become irritated and painful.  External hemorrhoids. These occur in the veins that are outside the anus and can be felt as a painful swelling or hard lump near the anus. Most hemorrhoids do not cause serious problems, and they can be managed with home treatments such as diet and lifestyle changes. If home treatments do not help the symptoms, procedures can be done to  shrink or remove the hemorrhoids. What are the causes? This condition is caused by increased pressure in the anal area. This pressure may result from various things, including:  Constipation.  Straining to have a bowel movement.  Diarrhea.  Pregnancy.  Obesity.  Sitting for long periods of time.  Heavy lifting or other activity that causes you to strain.  Anal sex.  Riding a bike for a long period of time. What are the signs or symptoms? Symptoms of this condition include:  Pain.  Anal itching or irritation.  Rectal bleeding.  Leakage of stool (feces).  Anal swelling.  One or more lumps around the anus. How is this diagnosed? This condition can often be diagnosed through a visual exam. Other exams or tests may also be done, such as:  An exam that involves feeling the rectal area with a gloved hand (digital rectal exam).  An exam of the anal canal that is done using a small tube (anoscope).  A blood test, if you have lost a significant amount of blood.  A test to look inside the colon using a flexible tube with a camera on the end (sigmoidoscopy or colonoscopy). How is this treated? This condition can usually be treated at home. However, various procedures may be done if dietary changes, lifestyle changes, and other home treatments do not help your symptoms. These procedures can help make the hemorrhoids smaller or remove them completely. Some of these procedures involve surgery, and others do not. Common procedures include:  Rubber band ligation. Rubber bands are placed at the base of the hemorrhoids to cut off their blood supply.  Sclerotherapy. Medicine is injected into the hemorrhoids to shrink them.  Infrared coagulation. A type of light energy is used to get rid of the hemorrhoids.  Hemorrhoidectomy surgery. The hemorrhoids are surgically removed, and the veins that supply them are tied off.  Stapled hemorrhoidopexy surgery. The surgeon staples the base of  the hemorrhoid to the rectal wall. Follow these instructions at home: Eating and drinking   Eat foods that have a lot of fiber in them, such as whole grains, beans, nuts, fruits, and vegetables.  Ask your health care provider about taking products that have added fiber (fiber supplements).  Reduce the amount of fat in your diet. You can do this by eating low-fat dairy products, eating less red meat, and avoiding processed foods.  Drink enough fluid to keep your urine pale yellow. Managing pain and swelling   Take warm sitz baths for 20 minutes, 3-4 times a day to ease pain and discomfort. You may do this in a bathtub or using a portable sitz bath that fits over the toilet.  If directed, apply ice to the affected area. Using ice packs between sitz baths may be helpful. ? Put ice in a plastic bag. ? Place a towel between your skin and  the bag. ? Leave the ice on for 20 minutes, 2-3 times a day. General instructions  Take over-the-counter and prescription medicines only as told by your health care provider.  Use medicated creams or suppositories as told.  Get regular exercise. Ask your health care provider how much and what kind of exercise is best for you. In general, you should do moderate exercise for at least 30 minutes on most days of the week (150 minutes each week). This can include activities such as walking, biking, or yoga.  Go to the bathroom when you have the urge to have a bowel movement. Do not wait.  Avoid straining to have bowel movements.  Keep the anal area dry and clean. Use wet toilet paper or moist towelettes after a bowel movement.  Do not sit on the toilet for long periods of time. This increases blood pooling and pain.  Keep all follow-up visits as told by your health care provider. This is important. Contact a health care provider if you have:  Increasing pain and swelling that are not controlled by treatment or medicine.  Difficulty having a bowel  movement, or you are unable to have a bowel movement.  Pain or inflammation outside the area of the hemorrhoids. Get help right away if you have:  Uncontrolled bleeding from your rectum. Summary  Hemorrhoids are swollen veins in and around the rectum or anus.  Most hemorrhoids can be managed with home treatments such as diet and lifestyle changes.  Taking warm sitz baths can help ease pain and discomfort.  In severe cases, procedures or surgery can be done to shrink or remove the hemorrhoids. This information is not intended to replace advice given to you by your health care provider. Make sure you discuss any questions you have with your health care provider. Document Revised: 02/21/2019 Document Reviewed: 02/14/2018 Elsevier Patient Education  [place].  Diverticulosis  Diverticulosis is a condition that develops when small pouches (diverticula) form in the wall of the large intestine (colon). The colon is where water is absorbed and stool (feces) is formed. The pouches form when the inside layer of the colon pushes through weak spots in the outer layers of the colon. You may have a few pouches or many of them. The pouches usually do not cause problems unless they become inflamed or infected. When this happens, the condition is called diverticulitis. What are the causes? The cause of this condition is not known. What increases the risk? The following factors may make you more likely to develop this condition:  Being older than age 23. Your risk for this condition increases with age. Diverticulosis is rare among people younger than age 35. By age 53, many people have it.  Eating a low-fiber diet.  Having frequent constipation.  Being overweight.  Not getting enough exercise.  Smoking.  Taking over-the-counter pain medicines, like aspirin and ibuprofen.  Having a family history of diverticulosis. What are the signs or symptoms? In most people, there are no  symptoms of this condition. If you do have symptoms, they may include:  Bloating.  Cramps in the abdomen.  Constipation or diarrhea.  Pain in the lower left side of the abdomen. How is this diagnosed? Because diverticulosis usually has no symptoms, it is most often diagnosed during an exam for other colon problems. The condition may be diagnosed by:  Using a flexible scope to examine the colon (colonoscopy).  Taking an X-ray of the colon after dye has been put into  the colon (barium enema).  Having a CT scan. How is this treated? You may not need treatment for this condition. Your health care provider may recommend treatment to prevent problems. You may need treatment if you have symptoms or if you previously had diverticulitis. Treatment may include:  Eating a high-fiber diet.  Taking a fiber supplement.  Taking a live bacteria supplement (probiotic).  Taking medicine to relax your colon. Follow these instructions at home: Medicines  Take over-the-counter and prescription medicines only as told by your health care provider.  If told by your health care provider, take a fiber supplement or probiotic. Constipation prevention Your condition may cause constipation. To prevent or treat constipation, you may need to:  Drink enough fluid to keep your urine pale yellow.  Take over-the-counter or prescription medicines.  Eat foods that are high in fiber, such as beans, whole grains, and fresh fruits and vegetables.  Limit foods that are high in fat and processed sugars, such as fried or sweet foods.  General instructions  Try not to strain when you have a bowel movement.  Keep all follow-up visits as told by your health care provider. This is important. Contact a health care provider if you:  Have pain in your abdomen.  Have bloating.  Have cramps.  Have not had a bowel movement in 3 days. Get help right away if:  Your pain gets worse.  Your bloating becomes very  bad.  You have a fever or chills, and your symptoms suddenly get worse.  You vomit.  You have bowel movements that are bloody or black.  You have bleeding from your rectum. Summary  Diverticulosis is a condition that develops when small pouches (diverticula) form in the wall of the large intestine (colon).  You may have a few pouches or many of them.  This condition is most often diagnosed during an exam for other colon problems.  Treatment may include increasing the fiber in your diet, taking supplements, or taking medicines. This information is not intended to replace advice given to you by your health care provider. Make sure you discuss any questions you have with your health care provider. Document Revised: 04/24/2019 Document Reviewed: 04/24/2019 Elsevier Patient Education  [place].  Colon Polyps  Polyps are tissue growths inside the body. Polyps can grow in many places, including the large intestine (colon). A polyp may be a round bump or a mushroom-shaped growth. You could have one polyp or several. Most colon polyps are noncancerous (benign). However, some colon polyps can become cancerous over time. Finding and removing the polyps early can help prevent this. What are the causes? The exact cause of colon polyps is not known. What increases the risk? You are more likely to develop this condition if you:  Have a family history of colon cancer or colon polyps.  Are older than 49 or older than 45 if you are African American.  Have inflammatory bowel disease, such as ulcerative colitis or Crohn's disease.  Have certain hereditary conditions, such as: ? Familial adenomatous polyposis. ? Lynch syndrome. ? Turcot syndrome. ? Peutz-Jeghers syndrome.  Are overweight.  Smoke cigarettes.  Do not get enough exercise.  Drink too much alcohol.  Eat a diet that is high in fat and red meat and low in fiber.  Had childhood cancer that was treated with abdominal  radiation. What are the signs or symptoms? Most polyps do not cause symptoms. If you have symptoms, they may include:  Blood coming from  your rectum when having a bowel movement.  Blood in your stool. The stool may look dark red or black.  Abdominal pain.  A change in bowel habits, such as constipation or diarrhea. How is this diagnosed? This condition is diagnosed with a colonoscopy. This is a procedure in which a lighted, flexible scope is inserted into the anus and then passed into the colon to examine the area. Polyps are sometimes found when a colonoscopy is done as part of routine cancer screening tests. How is this treated? Treatment for this condition involves removing any polyps that are found. Most polyps can be removed during a colonoscopy. Those polyps will then be tested for cancer. Additional treatment may be needed depending on the results of testing. Follow these instructions at home: Lifestyle  Maintain a healthy weight, or lose weight if recommended by your health care provider.  Exercise every day or as told by your health care provider.  Do not use any products that contain nicotine or tobacco, such as cigarettes and e-cigarettes. If you need help quitting, ask your health care provider.  If you drink alcohol, limit how much you have: ? 0-1 drink a day for women. ? 0-2 drinks a day for men.  Be aware of how much alcohol is in your drink. In the [place] one drink equals one 12 oz bottle of beer (355 mL), one 5 oz glass of wine (148 mL), or one 1 oz shot of hard liquor (44 mL). Eating and drinking   Eat foods that are high in fiber, such as fruits, vegetables, and whole grains.  Eat foods that are high in calcium and vitamin D, such as milk, cheese, yogurt, eggs, liver, fish, and broccoli.  Limit foods that are high in fat, such as fried foods and desserts.  Limit the amount of red meat and processed meat you eat, such as hot dogs, sausage, bacon, and lunch  meats. General instructions  Keep all follow-up visits as told by your health care provider. This is important. ? This includes having regularly scheduled colonoscopies. ? Talk to your health care provider about when you need a colonoscopy. Contact a health care provider if:  You have new or worsening bleeding during a bowel movement.  You have new or increased blood in your stool.  You have a change in bowel habits.  You lose weight for no known reason. Summary  Polyps are tissue growths inside the body. Polyps can grow in many places, including the colon.  Most colon polyps are noncancerous (benign), but some can become cancerous over time.  This condition is diagnosed with a colonoscopy.  Treatment for this condition involves removing any polyps that are found. Most polyps can be removed during a colonoscopy. This information is not intended to replace advice given to you by your health care provider. Make sure you discuss any questions you have with your health care provider. Document Revised: 01/10/2018 Document Reviewed: 01/10/2018 Elsevier Patient Education  [place].

## 2019-12-11 LAB — SURGICAL PATHOLOGY

## 2019-12-29 ENCOUNTER — Telehealth (INDEPENDENT_AMBULATORY_CARE_PROVIDER_SITE_OTHER): Payer: Self-pay | Admitting: Internal Medicine

## 2019-12-29 NOTE — Telephone Encounter (Signed)
Mother called stated patient has been having severe abd pain since her procedure on 3/3 - nothing is helping the pain - would like for Dr Laural Golden to know and please advise - ph# [phone]

## 2019-12-29 NOTE — Telephone Encounter (Signed)
Please advise the patient that she needs to go to the ED.

## 2019-12-31 ENCOUNTER — Encounter (INDEPENDENT_AMBULATORY_CARE_PROVIDER_SITE_OTHER): Payer: Self-pay | Admitting: Internal Medicine

## 2019-12-31 ENCOUNTER — Encounter (INDEPENDENT_AMBULATORY_CARE_PROVIDER_SITE_OTHER): Payer: Self-pay | Admitting: Gastroenterology

## 2020-02-10 ENCOUNTER — Ambulatory Visit: Payer: BC Managed Care – PPO | Admitting: Orthopaedic Surgery

## 2020-02-10 ENCOUNTER — Other Ambulatory Visit: Payer: Self-pay

## 2020-02-10 ENCOUNTER — Encounter: Payer: Self-pay | Admitting: Orthopaedic Surgery

## 2020-02-10 VITALS — Temp 97.4°F | Ht 63.0 in | Wt 123.0 lb

## 2020-02-10 DIAGNOSIS — M25561 Pain in right knee: Secondary | ICD-10-CM

## 2020-02-10 DIAGNOSIS — G8929 Other chronic pain: Secondary | ICD-10-CM

## 2020-02-10 NOTE — Progress Notes (Signed)
PROCEDURE NOTE:  The patient requests injections of the right knee , verbal consent was obtained.  The right knee was prepped appropriately after time out was performed.   Sterile technique was observed and injection of 1 cc of Depo-Medrol 40 mg with several cc's of plain xylocaine. Anesthesia was provided by ethyl chloride and a 20-gauge needle was used to inject the knee area. The injection was tolerated well.  A band aid dressing was applied.  The patient was advised to apply ice later today and tomorrow to the injection sight as needed.  I will schedule MRI of the right knee.  She was to have it in January but was unable to go.  Her pain is worse.  Return in two weeks.  Call if any problem.  Precautions discussed.   Electronically Signed Sanjuana Kava, MD 5/4/20218:39 AM

## 2020-02-13 ENCOUNTER — Ambulatory Visit (HOSPITAL_COMMUNITY)
Admission: RE | Admit: 2020-02-13 | Discharge: 2020-02-13 | Disposition: A | Discharge status: Home / Self Care | Payer: BC Managed Care – PPO | Source: Ambulatory Visit | Attending: Orthopaedic Surgery | Admitting: Orthopaedic Surgery

## 2020-02-13 ENCOUNTER — Other Ambulatory Visit: Payer: Self-pay

## 2020-02-13 DIAGNOSIS — M25561 Pain in right knee: Secondary | ICD-10-CM | POA: Diagnosis present

## 2020-02-13 DIAGNOSIS — G8929 Other chronic pain: Secondary | ICD-10-CM | POA: Diagnosis present

## 2020-02-16 ENCOUNTER — Telehealth (INDEPENDENT_AMBULATORY_CARE_PROVIDER_SITE_OTHER): Payer: Self-pay | Admitting: Internal Medicine

## 2020-02-16 NOTE — Telephone Encounter (Signed)
Sister Corine Shelter) called stated ever since patient had colonoscopy on 12/10/19 she has had some blood in her stool and has a lot of abdominal pain - please advise ph# [phone]

## 2020-02-17 NOTE — Telephone Encounter (Signed)
Per Rehman make sure that patient is taking Fiber 4 grams daily. She may use Anusol HC Suppository 1 per rectum QHS for 2 weeks. To be called to the patient's pharmacy She will need to have OC in next week or the week after, per Dr.Rehman this may be with [facility] Morales. He does not feel that any of this is coming from her [facility] in March. Sister called and made aware.

## 2020-03-09 ENCOUNTER — Other Ambulatory Visit: Payer: Self-pay

## 2020-03-09 ENCOUNTER — Ambulatory Visit: Payer: BC Managed Care – PPO | Admitting: Orthopaedic Surgery

## 2020-03-09 ENCOUNTER — Encounter: Payer: Self-pay | Admitting: Orthopaedic Surgery

## 2020-03-09 VITALS — BP 123/90 | HR 122 | Ht 63.0 in | Wt 123.0 lb

## 2020-03-09 DIAGNOSIS — F17210 Nicotine dependence, cigarettes, uncomplicated: Secondary | ICD-10-CM | POA: Diagnosis not present

## 2020-03-09 DIAGNOSIS — M25561 Pain in right knee: Secondary | ICD-10-CM | POA: Diagnosis not present

## 2020-03-09 DIAGNOSIS — G8929 Other chronic pain: Secondary | ICD-10-CM | POA: Diagnosis not present

## 2020-03-09 NOTE — Progress Notes (Signed)
Patient Carol Rowe, female DOB:07/12/1956, 64 y.o. KVQ:[id number]  Chief Complaint  Patient presents with  . Knee Pain    right   . Results    review MRI    HPI  Carol Rowe is a 64 y.o. female who has right knee pain with giving way.  She had MRI done which showed: IMPRESSION: 1. Degenerated and torn anterior horn medial meniscus tear. 2. Intact ligamentous structures and no acute bony findings. 3. Tricompartmental degenerative changes most significant in the medial compartment. 4. Moderate-sized joint effusion and mild synovitis. Small loose bodies are noted in the anterior joint space.  I have explained the findings to her.  I have recommended arthroscopy.  I have explained the procedure.  I will have Dr. Romeo Apple see her. She agrees.  She wants injection today.  PROCEDURE NOTE:  The patient requests injections of the right knee , verbal consent was obtained.  The right knee was prepped appropriately after time out was performed.   Sterile technique was observed and injection of 1 cc of Depo-Medrol 40 mg with several cc's of plain xylocaine. Anesthesia was provided by ethyl chloride and a 20-gauge needle was used to inject the knee area. The injection was tolerated well.  A band aid dressing was applied.  The patient was advised to apply ice later today and tomorrow to the injection sight as needed.  I told her that it would be temporary but she wanted some pain relief.   Body mass index is 21.79 kg/m.  ROS  Review of Systems  Constitutional: Positive for activity change.  Musculoskeletal: Positive for arthralgias, gait problem and joint swelling.  All other systems reviewed and are negative.   All other systems reviewed and are negative.  The following is a summary of the past history medically, past history surgically, known current medicines, social history and family history.  This information is gathered electronically by the computer from prior  information and documentation.  I review this each visit and have found including this information at this point in the chart is beneficial and informative.    Past Medical History:  Diagnosis Date  . Arthritis   . Hypertension   . VIN III (vulvar intraepithelial neoplasia III)   . Vulvar lesion     Past Surgical History:  Procedure Laterality Date  . ABDOMINAL HYSTERECTOMY  1989  . CESAREAN SECTION    . CHOLECYSTECTOMY    . COLONOSCOPY N/A 12/10/2019   Procedure: COLONOSCOPY;  Surgeon: [name], MD;  Location: [facility];  Service: Endoscopy;  Laterality: N/A;  730  . POLYPECTOMY  12/10/2019   Procedure: POLYPECTOMY;  Surgeon: [name], MD;  Location: [facility];  Service: Endoscopy;;  . skin cancer removal      Family History  Problem Relation Age of Onset  . Colon cancer Father   . Heart disease Father   . Breast cancer Mother   . Heart disease Maternal Grandmother   . Diabetes Paternal Grandmother     Social History Social History   Tobacco Use  . Smoking status: Current Some Day Smoker    Packs/day: 0.50    Types: Cigarettes  . Smokeless tobacco: Never Used  Substance Use Topics  . Alcohol use: Yes    Comment: occ  . Drug use: No    Allergies  Allergen Reactions  . Macrobid [Nitrofurantoin Macrocrystal] Hives  . Mirtazapine Swelling    Current Outpatient Medications  Medication Sig Dispense Refill  .  acetaminophen (TYLENOL) 500 MG tablet Take 1,000 mg by mouth every 6 (six) hours as needed for moderate pain.    Marland Kitchen albuterol (VENTOLIN HFA) 108 (90 Base) MCG/ACT inhaler Inhale 2 puffs into the lungs 2 (two) times daily as needed for shortness of breath.    . ALPRAZolam (XANAX) 0.5 MG tablet Take 0.5 mg by mouth 3 (three) times daily as needed for anxiety.     Marland Kitchen ascorbic acid (VITAMIN C) 500 MG tablet Take 500 mg by mouth every other day.    Marland Kitchen aspirin EC 325 MG tablet Take 325 mg by mouth daily as needed (headaches).    . B Complex  Vitamins (B COMPLEX PO) Take 1 Dose by mouth every other day. Liquid b complex    . cetirizine (ZYRTEC) 10 MG tablet Take 10 mg by mouth daily as needed for allergies.    . Cholecalciferol (DIALYVITE VITAMIN D 5000) 125 MCG (5000 UT) capsule Take 5,000 Units by mouth every other day.    . estradiol (VIVELLE-DOT) 0.1 MG/24HR Place 1 patch onto the skin 2 (two) times a week.    . fluticasone (FLONASE) 50 MCG/ACT nasal spray Place 2 sprays into both nostrils daily.    Marland Kitchen gabapentin (NEURONTIN) 300 MG capsule Take 300 mg by mouth at bedtime.    . Glycerin-Polysorbate 80 (REFRESH DRY EYE THERAPY OP) Place 1 drop into both eyes daily as needed (dry eyes).    Marland Kitchen levocetirizine (XYZAL) 5 MG tablet Take 5 mg by mouth at bedtime.    . Magnesium 400 MG CAPS Take 400 mg by mouth every other day.    . Menthol-Methyl Salicylate (SALONPAS PAIN RELIEF PATCH EX) Apply 1-3 patches topically daily as needed (pain).    Marland Kitchen OVER THE COUNTER MEDICATION Take 6 tablets by mouth daily as needed (constipation). Tam herbal laxative    . pantoprazole (PROTONIX) 40 MG tablet Take 40 mg by mouth See admin instructions. Take 40 mg daily may take a second 40 mg dose as needed for heartburn    . potassium chloride (KLOR-CON) 10 MEQ tablet Take 10 mEq by mouth daily.    . Probiotic CAPS Take 1 capsule by mouth every other day.    . ramipril (ALTACE) 2.5 MG capsule Take 2.5 mg by mouth daily.    Marland Kitchen torsemide (DEMADEX) 20 MG tablet Take 20-40 mg by mouth See admin instructions. Take 20 or 40 mg once daily in the morning    . trolamine salicylate (ASPERCREME) 10 % cream Apply 1 application topically as needed for muscle pain.    . Turmeric Curcumin 500 MG CAPS Take 500 mg by mouth every other day.    . valACYclovir (VALTREX) 1000 MG tablet Take 500-1,000 mg by mouth See admin instructions. Take 500 mg daily, may take 1000 mg instead if currently breaking out    . vitamin E 180 MG (400 UNITS) capsule Take 400 Units by mouth every other  day.    . Wheat Dextrin (BENEFIBER DRINK MIX) PACK Take 4 g by mouth at bedtime.    . Zinc 50 MG CAPS Take 50 mg by mouth every other day.     No current facility-administered medications for this visit.     Physical Exam  Blood pressure 123/90, pulse (!) 122, height 5\' 3"  (1.6 m), weight 123 lb (55.8 kg).  Constitutional: overall normal hygiene, normal nutrition, well developed, normal grooming, normal body habitus. Assistive device:none  Musculoskeletal: gait and station Limp right, muscle tone and strength are  normal, no tremors or atrophy is present.  .  Neurological: coordination overall normal.  Deep tendon reflex/nerve stretch intact.  Sensation normal.  Cranial nerves II-XII intact.   Skin:   Normal overall no scars, lesions, ulcers or rashes. No psoriasis.  Psychiatric: Alert and oriented x 3.  Recent memory intact, remote memory unclear.  Normal mood and affect. Well groomed.  Good eye contact.  Cardiovascular: overall no swelling, no varicosities, no edema bilaterally, normal temperatures of the legs and arms, no clubbing, cyanosis and good capillary refill.  Lymphatic: palpation is normal.  Right knee with effusion, crepitus, medial joint line pain, ROM 0 to 105, limp right, positive right medial [place].  NV intact.  All other systems reviewed and are negative   The patient has been educated about the nature of the problem(s) and counseled on treatment options.  The patient appeared to understand what I have discussed and is in agreement with it.  Encounter Diagnoses  Name Primary?  . Chronic pain of right knee Yes  . Nicotine dependence, cigarettes, uncomplicated     PLAN Call if any problems.  Precautions discussed.  Continue current medications.   Return to clinic to see Dr. Romeo Apple   Electronically Signed Darreld Mclean, MD 6/1/20219:59 AM

## 2020-03-23 ENCOUNTER — Other Ambulatory Visit: Payer: Self-pay

## 2020-03-23 ENCOUNTER — Encounter: Payer: Self-pay | Admitting: Orthopedic Surgery

## 2020-03-23 ENCOUNTER — Ambulatory Visit (INDEPENDENT_AMBULATORY_CARE_PROVIDER_SITE_OTHER): Payer: BC Managed Care – PPO | Admitting: Orthopedic Surgery

## 2020-03-23 VITALS — BP 139/100 | HR 113 | Ht 63.0 in | Wt 123.0 lb

## 2020-03-23 DIAGNOSIS — M25561 Pain in right knee: Secondary | ICD-10-CM | POA: Diagnosis not present

## 2020-03-23 DIAGNOSIS — G8929 Other chronic pain: Secondary | ICD-10-CM

## 2020-03-23 DIAGNOSIS — M25461 Effusion, right knee: Secondary | ICD-10-CM

## 2020-03-23 NOTE — Progress Notes (Addendum)
Chief Complaint  Patient presents with  . Knee Pain    right /surgical consult    Patient says she doesn't want to have surgery but maybe try to draw off some fluid   Procedure note injection and aspiration right knee joint  Verbal consent was obtained to aspirate and inject the right knee joint   Timeout was completed to confirm the site of aspiration and injection  An 18-gauge needle was used to aspirate the knee joint from a suprapatellar lateral approach.  The medications used were 40 mg of Depo-Medrol and 1% lidocaine 3 cc  Anesthesia was provided by ethyl chloride and the skin was prepped with alcohol.  After cleaning the skin with alcohol an 18-gauge needle was used to aspirate the right knee joint.    We obtained 20 cc of fluid clear yellow fluid  We followed this by injection of 40 mg of Depo-Medrol and 3 cc 1% lidocaine.  There were no complications. A sterile bandage was applied.  F/u with Luna Glasgow unless wants surgery

## 2020-03-31 ENCOUNTER — Telehealth: Payer: Self-pay | Admitting: Orthopedic Surgery

## 2020-03-31 NOTE — Telephone Encounter (Signed)
Ok she needs appointment to discuss the surgery in detail, last visit she declined surgery, so now they need to discuss  No rush,next available okay, we do not have any spots for the surgery until [facility] July

## 2020-03-31 NOTE — Telephone Encounter (Signed)
Carol Rowe called this morning and said she has seen Dr. Aline Brochure for consult for her knee.  She said she called and spoke with her insurance company regarding her having surgery.  They told her that since she has met her deductible, she should just go ahead and have the knee replacement that she discussed with Dr. Aline Brochure.  Please call her regarding what steps she needs to take to next  Thanks

## 2020-04-30 ENCOUNTER — Ambulatory Visit: Payer: BC Managed Care – PPO | Admitting: Orthopedic Surgery

## 2021-09-28 ENCOUNTER — Other Ambulatory Visit: Payer: Self-pay | Admitting: Obstetrics & Gynecology

## 2021-09-28 DIAGNOSIS — R928 Other abnormal and inconclusive findings on diagnostic imaging of breast: Secondary | ICD-10-CM

## 2021-11-11 ENCOUNTER — Other Ambulatory Visit: Payer: Self-pay

## 2021-11-11 ENCOUNTER — Ambulatory Visit
Admission: RE | Admit: 2021-11-11 | Discharge: 2021-11-11 | Disposition: A | Discharge status: Home / Self Care | Payer: BC Managed Care – PPO | Source: Ambulatory Visit | Attending: Obstetrics & Gynecology | Admitting: Obstetrics & Gynecology

## 2021-11-11 ENCOUNTER — Other Ambulatory Visit: Payer: Self-pay | Admitting: Obstetrics & Gynecology

## 2021-11-11 DIAGNOSIS — R928 Other abnormal and inconclusive findings on diagnostic imaging of breast: Secondary | ICD-10-CM

## 2021-11-11 DIAGNOSIS — R921 Mammographic calcification found on diagnostic imaging of breast: Secondary | ICD-10-CM

## 2021-11-16 ENCOUNTER — Other Ambulatory Visit: Payer: Self-pay | Admitting: Obstetrics & Gynecology

## 2021-11-16 DIAGNOSIS — R921 Mammographic calcification found on diagnostic imaging of breast: Secondary | ICD-10-CM

## 2021-11-17 ENCOUNTER — Ambulatory Visit
Admission: RE | Admit: 2021-11-17 | Discharge: 2021-11-17 | Disposition: A | Discharge status: Home / Self Care | Payer: BC Managed Care – PPO | Source: Ambulatory Visit | Attending: Obstetrics & Gynecology | Admitting: Obstetrics & Gynecology

## 2021-11-17 ENCOUNTER — Other Ambulatory Visit: Payer: BC Managed Care – PPO

## 2021-11-17 DIAGNOSIS — R921 Mammographic calcification found on diagnostic imaging of breast: Secondary | ICD-10-CM

## 2022-04-26 IMAGING — MG MM BREAST BX W LOC DEV 1ST LESION IMAGE BX SPEC STEREO GUIDE*L*
8 of 14 series · 8 of 40 positions shown · non-contrast
Comparison: Previous exams.
COMPARISON: Previous exams.

Addendum:
CLINICAL DATA: Patient presents for stereotactic core needle biopsy
of a group of calcifications in the posterior, upper inner left
breast.

EXAM:
LEFT BREAST STEREOTACTIC CORE NEEDLE BIOPSY

[L (1 of 6)]
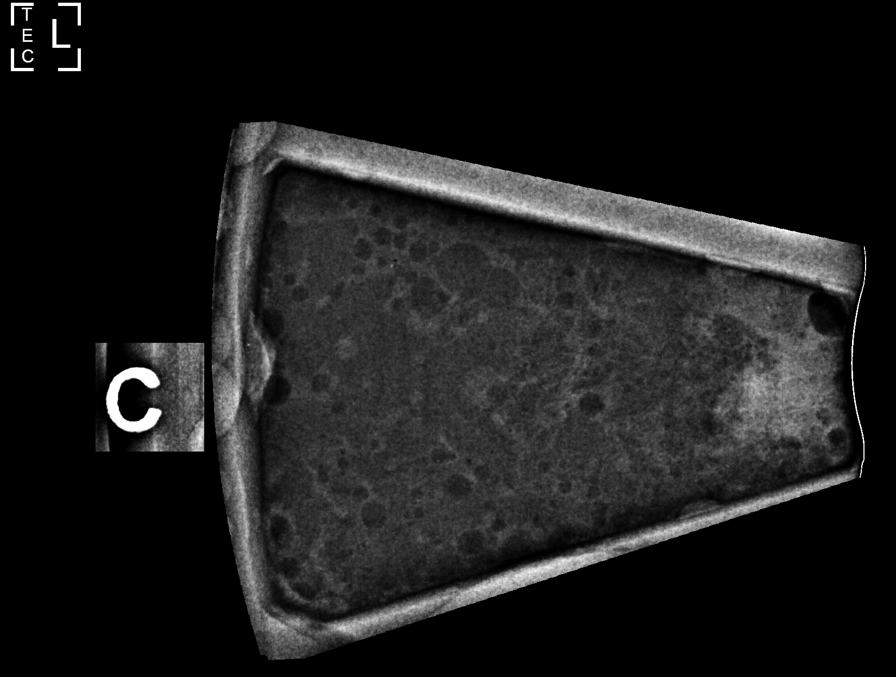

[L (2 of 6)]
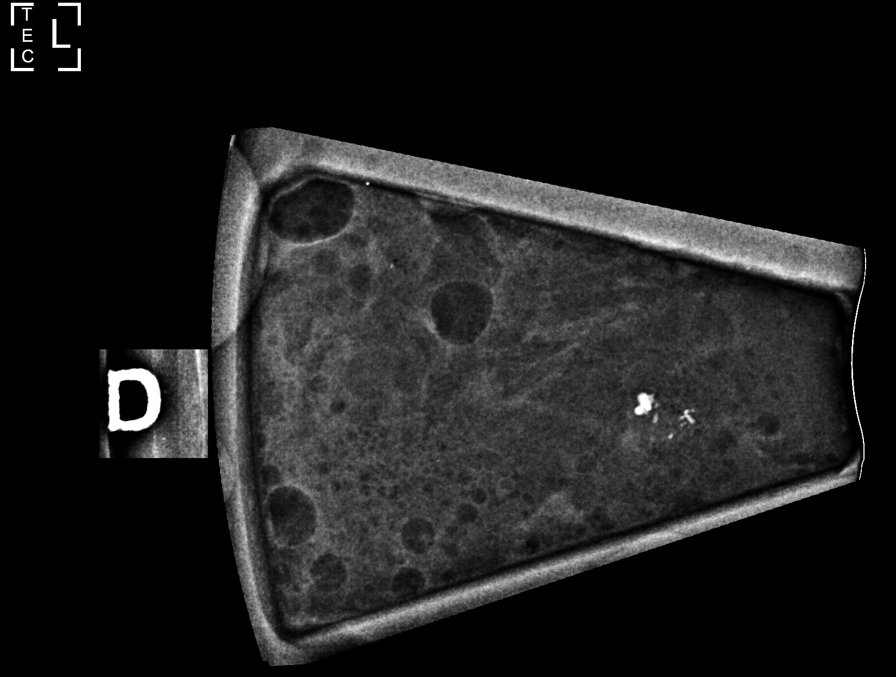

[L (3 of 6)]
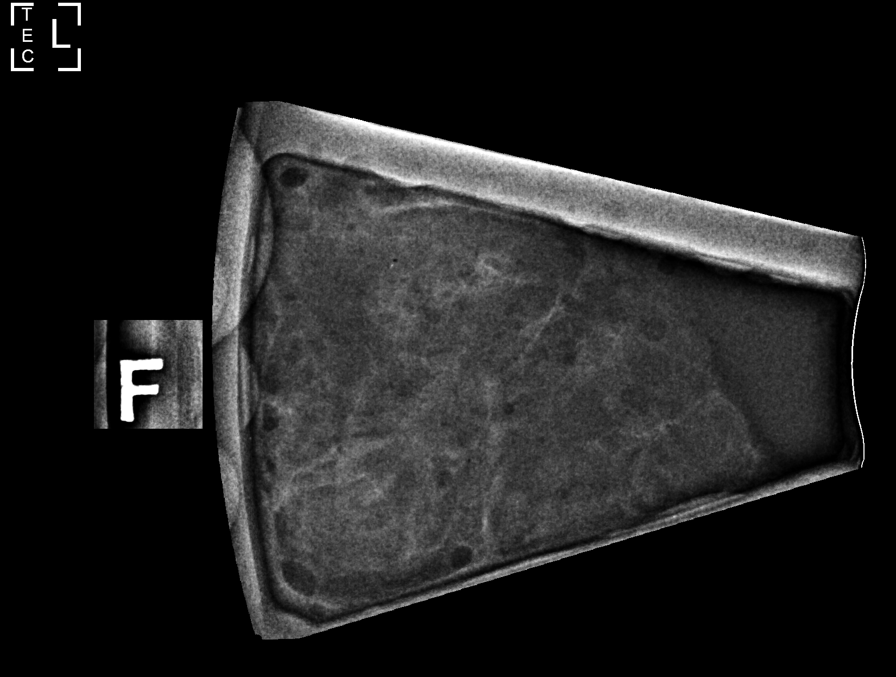

[L (4 of 6)]
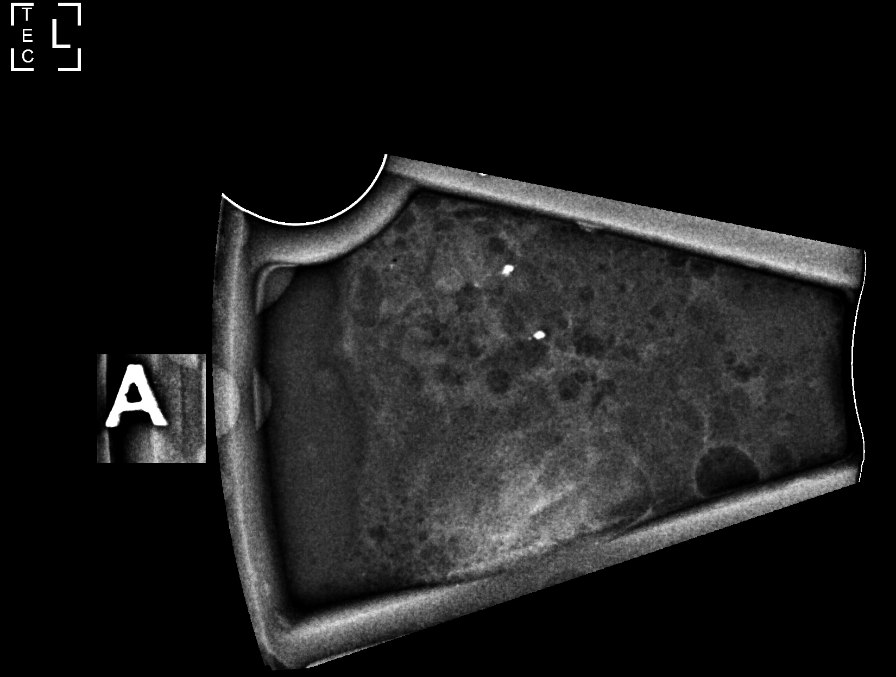

[L (5 of 6)]
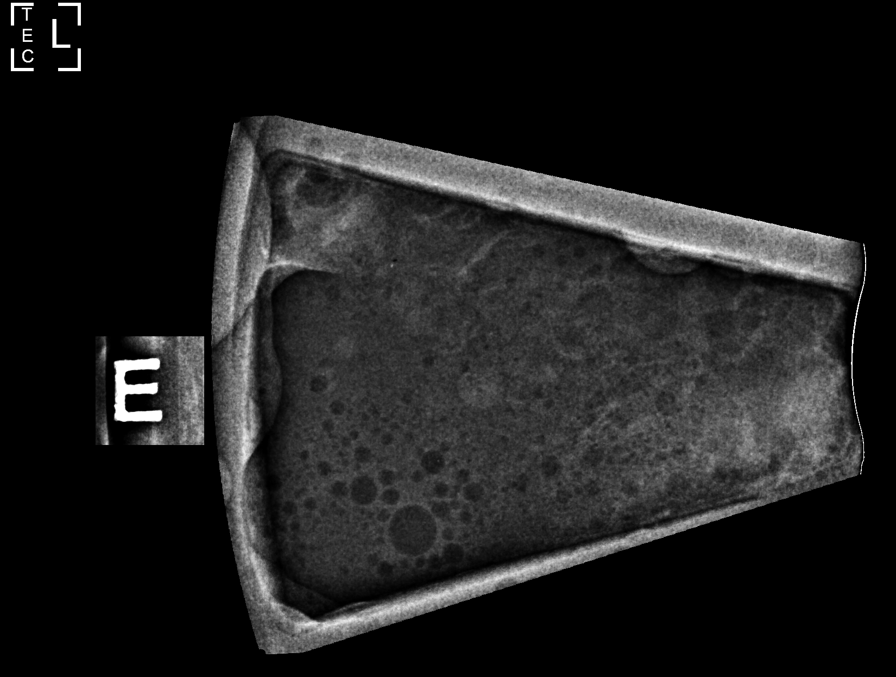

[L (6 of 6)]
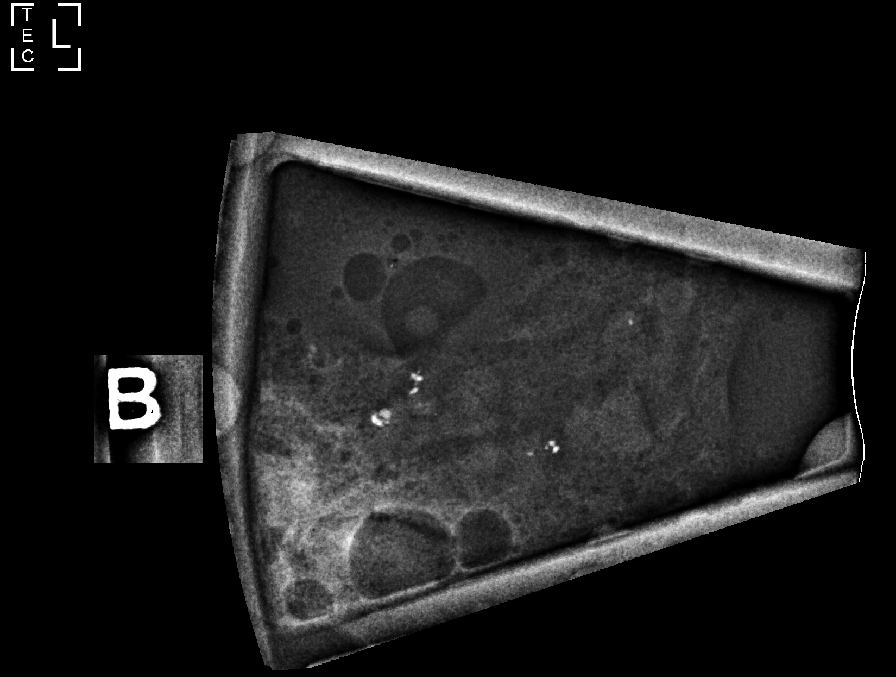

[L ML]
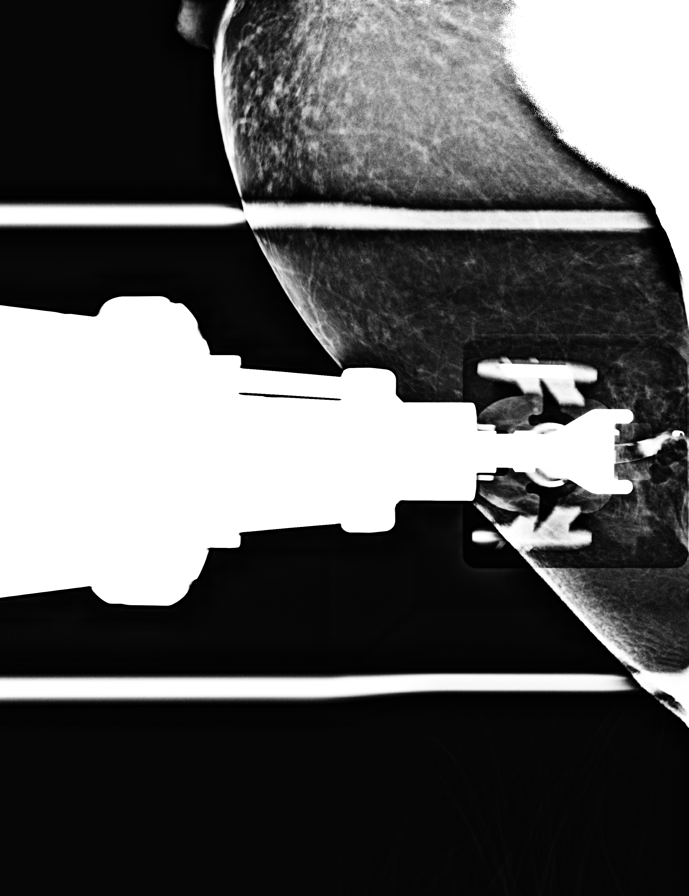

[L ML tomo · tomo slice 26/51.0]
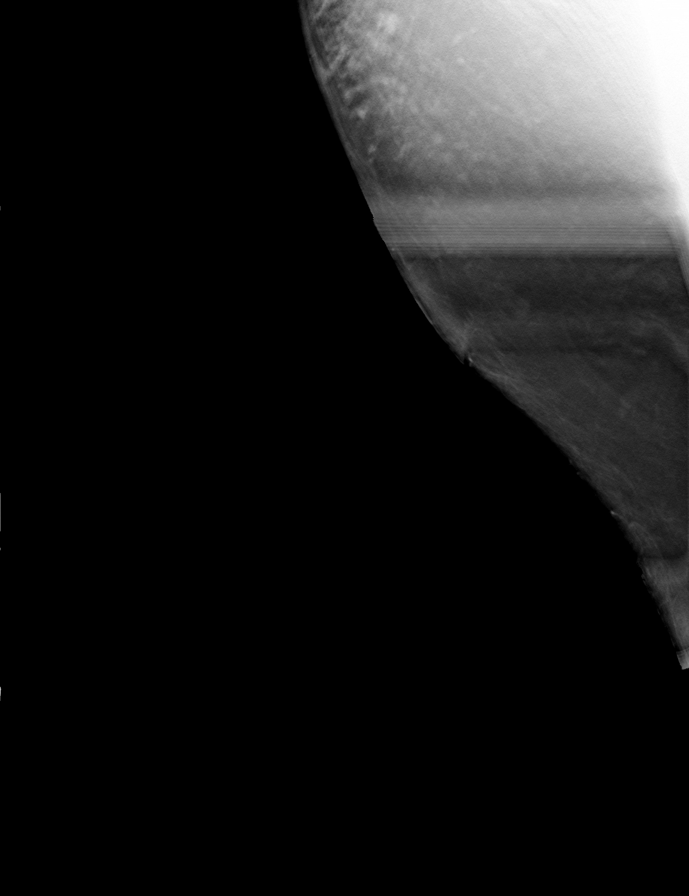

[8 of 40 positions shown; findings below may reference images not displayed]



Using sterile technique and 1% Lidocaine as local anesthetic, under
stereotactic guidance, a 9 gauge vacuum assisted device was used to
perform core needle biopsy of calcifications in the upper inner left
breast using a medial approach. Specimen radiograph was performed
showing several calcifications for which biopsy was performed.
Specimens with calcifications are identified for pathology.

Lesion quadrant: Upper inner quadrant

At the conclusion of the procedure, a coil shaped tissue marker clip
was deployed into the biopsy cavity. Follow-up 2-view mammogram was
performed and dictated separately.
IMPRESSION: Stereotactic-guided biopsy of left breast calcifications. No
apparent complications.

ADDENDUM:
Pathology revealed SCLEROTIC FIBROADENOMATOID NODULE WITH DYSTROPHIC
CALCIFICATIONS of the LEFT breast, upper inner quadrant, (coil
clip). This was found to be concordant by Dr. Md Sapon Megwal.

Pathology results were discussed with the patient by telephone. The
patient reported doing well after the biopsy with bleeding and
tenderness at the site, as well as LEFT shoulder tenderness and
stiffness. Post biopsy instructions and care were reviewed and
questions were answered. The patient was encouraged to call The
direct phone number was provided.

The patient was instructed to return for annual screening

Pathology results reported by Marleen Ho, RN on 11/18/2021.



Using sterile technique and 1% Lidocaine as local anesthetic, under
stereotactic guidance, a 9 gauge vacuum assisted device was used to
perform core needle biopsy of calcifications in the upper inner left
breast using a medial approach. Specimen radiograph was performed
showing several calcifications for which biopsy was performed.
Specimens with calcifications are identified for pathology.

Lesion quadrant: Upper inner quadrant

At the conclusion of the procedure, a coil shaped tissue marker clip
was deployed into the biopsy cavity. Follow-up 2-view mammogram was
performed and dictated separately.
IMPRESSION: Stereotactic-guided biopsy of left breast calcifications. No
apparent complications.

## 2022-10-10 ENCOUNTER — Ambulatory Visit (HOSPITAL_COMMUNITY)
Admission: RE | Admit: 2022-10-10 | Discharge: 2022-10-10 | Disposition: A | Discharge status: Home / Self Care | Payer: BC Managed Care – PPO | Source: Ambulatory Visit | Attending: Nurse Practitioner | Admitting: Nurse Practitioner

## 2022-10-10 ENCOUNTER — Other Ambulatory Visit (HOSPITAL_COMMUNITY): Payer: Self-pay | Admitting: Nurse Practitioner

## 2022-10-10 DIAGNOSIS — R0600 Dyspnea, unspecified: Secondary | ICD-10-CM | POA: Diagnosis present

## 2022-10-10 DIAGNOSIS — M79602 Pain in left arm: Secondary | ICD-10-CM | POA: Diagnosis present

## 2022-12-07 ENCOUNTER — Encounter: Payer: Self-pay | Admitting: Radiology

## 2023-02-28 ENCOUNTER — Other Ambulatory Visit (HOSPITAL_COMMUNITY): Payer: Self-pay | Admitting: Internal Medicine

## 2023-02-28 DIAGNOSIS — F17200 Nicotine dependence, unspecified, uncomplicated: Secondary | ICD-10-CM

## 2023-03-21 ENCOUNTER — Encounter: Payer: Self-pay | Admitting: Internal Medicine

## 2023-04-11 ENCOUNTER — Ambulatory Visit: Payer: BC Managed Care – PPO | Admitting: Internal Medicine

## 2023-04-18 ENCOUNTER — Ambulatory Visit (HOSPITAL_COMMUNITY)
Admission: RE | Admit: 2023-04-18 | Discharge: 2023-04-18 | Disposition: A | Discharge status: Home / Self Care | Payer: BC Managed Care – PPO | Source: Ambulatory Visit | Attending: Internal Medicine | Admitting: Internal Medicine

## 2023-04-18 DIAGNOSIS — F17200 Nicotine dependence, unspecified, uncomplicated: Secondary | ICD-10-CM | POA: Insufficient documentation

## 2023-04-25 ENCOUNTER — Ambulatory Visit: Payer: BC Managed Care – PPO | Admitting: Internal Medicine

## 2023-07-27 ENCOUNTER — Emergency Department (HOSPITAL_COMMUNITY): Payer: BC Managed Care – PPO

## 2023-07-27 ENCOUNTER — Encounter (HOSPITAL_COMMUNITY): Payer: Self-pay

## 2023-07-27 ENCOUNTER — Emergency Department (HOSPITAL_COMMUNITY): Payer: Medicare HMO

## 2023-07-27 ENCOUNTER — Inpatient Hospital Stay (HOSPITAL_COMMUNITY)
Admission: EM | Admit: 2023-07-27 | Discharge: 2023-08-11 | DRG: 871 | Disposition: A | Discharge status: Skilled Nursing Facility | Payer: Medicare HMO | Attending: Internal Medicine | Admitting: Internal Medicine

## 2023-07-27 ENCOUNTER — Other Ambulatory Visit: Payer: Self-pay

## 2023-07-27 DIAGNOSIS — T68XXXA Hypothermia, initial encounter: Principal | ICD-10-CM

## 2023-07-27 DIAGNOSIS — R1312 Dysphagia, oropharyngeal phase: Secondary | ICD-10-CM | POA: Diagnosis not present

## 2023-07-27 DIAGNOSIS — J90 Pleural effusion, not elsewhere classified: Secondary | ICD-10-CM | POA: Diagnosis not present

## 2023-07-27 DIAGNOSIS — K7689 Other specified diseases of liver: Secondary | ICD-10-CM | POA: Diagnosis not present

## 2023-07-27 DIAGNOSIS — J439 Emphysema, unspecified: Secondary | ICD-10-CM | POA: Diagnosis not present

## 2023-07-27 DIAGNOSIS — D6959 Other secondary thrombocytopenia: Secondary | ICD-10-CM | POA: Diagnosis present

## 2023-07-27 DIAGNOSIS — R579 Shock, unspecified: Secondary | ICD-10-CM | POA: Diagnosis present

## 2023-07-27 DIAGNOSIS — N390 Urinary tract infection, site not specified: Secondary | ICD-10-CM | POA: Diagnosis present

## 2023-07-27 DIAGNOSIS — R279 Unspecified lack of coordination: Secondary | ICD-10-CM | POA: Diagnosis not present

## 2023-07-27 DIAGNOSIS — Z681 Body mass index (BMI) 19 or less, adult: Secondary | ICD-10-CM | POA: Diagnosis not present

## 2023-07-27 DIAGNOSIS — R29898 Other symptoms and signs involving the musculoskeletal system: Secondary | ICD-10-CM

## 2023-07-27 DIAGNOSIS — K921 Melena: Secondary | ICD-10-CM | POA: Diagnosis not present

## 2023-07-27 DIAGNOSIS — Z743 Need for continuous supervision: Secondary | ICD-10-CM | POA: Diagnosis not present

## 2023-07-27 DIAGNOSIS — Z452 Encounter for adjustment and management of vascular access device: Secondary | ICD-10-CM | POA: Diagnosis not present

## 2023-07-27 DIAGNOSIS — E440 Moderate protein-calorie malnutrition: Secondary | ICD-10-CM | POA: Diagnosis not present

## 2023-07-27 DIAGNOSIS — R918 Other nonspecific abnormal finding of lung field: Secondary | ICD-10-CM | POA: Diagnosis not present

## 2023-07-27 DIAGNOSIS — J690 Pneumonitis due to inhalation of food and vomit: Secondary | ICD-10-CM | POA: Diagnosis present

## 2023-07-27 DIAGNOSIS — R932 Abnormal findings on diagnostic imaging of liver and biliary tract: Secondary | ICD-10-CM

## 2023-07-27 DIAGNOSIS — R41841 Cognitive communication deficit: Secondary | ICD-10-CM | POA: Diagnosis not present

## 2023-07-27 DIAGNOSIS — I2510 Atherosclerotic heart disease of native coronary artery without angina pectoris: Secondary | ICD-10-CM | POA: Diagnosis present

## 2023-07-27 DIAGNOSIS — Z7401 Bed confinement status: Secondary | ICD-10-CM | POA: Diagnosis not present

## 2023-07-27 DIAGNOSIS — G928 Other toxic encephalopathy: Secondary | ICD-10-CM | POA: Diagnosis not present

## 2023-07-27 DIAGNOSIS — R0902 Hypoxemia: Secondary | ICD-10-CM | POA: Diagnosis not present

## 2023-07-27 DIAGNOSIS — E162 Hypoglycemia, unspecified: Secondary | ICD-10-CM | POA: Diagnosis not present

## 2023-07-27 DIAGNOSIS — G9340 Encephalopathy, unspecified: Secondary | ICD-10-CM

## 2023-07-27 DIAGNOSIS — R739 Hyperglycemia, unspecified: Secondary | ICD-10-CM | POA: Diagnosis not present

## 2023-07-27 DIAGNOSIS — E878 Other disorders of electrolyte and fluid balance, not elsewhere classified: Secondary | ICD-10-CM | POA: Diagnosis present

## 2023-07-27 DIAGNOSIS — K567 Ileus, unspecified: Secondary | ICD-10-CM | POA: Diagnosis not present

## 2023-07-27 DIAGNOSIS — E041 Nontoxic single thyroid nodule: Secondary | ICD-10-CM | POA: Diagnosis not present

## 2023-07-27 DIAGNOSIS — D696 Thrombocytopenia, unspecified: Secondary | ICD-10-CM | POA: Diagnosis not present

## 2023-07-27 DIAGNOSIS — Z751 Person awaiting admission to adequate facility elsewhere: Secondary | ICD-10-CM

## 2023-07-27 DIAGNOSIS — K7030 Alcoholic cirrhosis of liver without ascites: Secondary | ICD-10-CM | POA: Diagnosis present

## 2023-07-27 DIAGNOSIS — J189 Pneumonia, unspecified organism: Secondary | ICD-10-CM | POA: Diagnosis not present

## 2023-07-27 DIAGNOSIS — Y900 Blood alcohol level of less than 20 mg/100 ml: Secondary | ICD-10-CM | POA: Diagnosis not present

## 2023-07-27 DIAGNOSIS — J440 Chronic obstructive pulmonary disease with acute lower respiratory infection: Secondary | ICD-10-CM | POA: Diagnosis not present

## 2023-07-27 DIAGNOSIS — D62 Acute posthemorrhagic anemia: Secondary | ICD-10-CM | POA: Diagnosis not present

## 2023-07-27 DIAGNOSIS — E872 Acidosis, unspecified: Secondary | ICD-10-CM | POA: Diagnosis not present

## 2023-07-27 DIAGNOSIS — R58 Hemorrhage, not elsewhere classified: Secondary | ICD-10-CM | POA: Diagnosis not present

## 2023-07-27 DIAGNOSIS — I10 Essential (primary) hypertension: Secondary | ICD-10-CM | POA: Diagnosis not present

## 2023-07-27 DIAGNOSIS — E785 Hyperlipidemia, unspecified: Secondary | ICD-10-CM | POA: Diagnosis present

## 2023-07-27 DIAGNOSIS — R569 Unspecified convulsions: Secondary | ICD-10-CM | POA: Diagnosis not present

## 2023-07-27 DIAGNOSIS — Z043 Encounter for examination and observation following other accident: Secondary | ICD-10-CM | POA: Diagnosis not present

## 2023-07-27 DIAGNOSIS — E876 Hypokalemia: Secondary | ICD-10-CM | POA: Diagnosis present

## 2023-07-27 DIAGNOSIS — R6521 Severe sepsis with septic shock: Secondary | ICD-10-CM | POA: Diagnosis not present

## 2023-07-27 DIAGNOSIS — R4182 Altered mental status, unspecified: Secondary | ICD-10-CM | POA: Diagnosis not present

## 2023-07-27 DIAGNOSIS — K559 Vascular disorder of intestine, unspecified: Secondary | ICD-10-CM | POA: Diagnosis not present

## 2023-07-27 DIAGNOSIS — Z8249 Family history of ischemic heart disease and other diseases of the circulatory system: Secondary | ICD-10-CM

## 2023-07-27 DIAGNOSIS — Z881 Allergy status to other antibiotic agents status: Secondary | ICD-10-CM

## 2023-07-27 DIAGNOSIS — F1020 Alcohol dependence, uncomplicated: Secondary | ICD-10-CM | POA: Diagnosis present

## 2023-07-27 DIAGNOSIS — J181 Lobar pneumonia, unspecified organism: Secondary | ICD-10-CM | POA: Diagnosis not present

## 2023-07-27 DIAGNOSIS — R29818 Other symptoms and signs involving the nervous system: Secondary | ICD-10-CM | POA: Diagnosis not present

## 2023-07-27 DIAGNOSIS — R188 Other ascites: Secondary | ICD-10-CM | POA: Diagnosis not present

## 2023-07-27 DIAGNOSIS — W19XXXA Unspecified fall, initial encounter: Secondary | ICD-10-CM | POA: Diagnosis not present

## 2023-07-27 DIAGNOSIS — E43 Unspecified severe protein-calorie malnutrition: Secondary | ICD-10-CM | POA: Diagnosis not present

## 2023-07-27 DIAGNOSIS — M47812 Spondylosis without myelopathy or radiculopathy, cervical region: Secondary | ICD-10-CM | POA: Diagnosis not present

## 2023-07-27 DIAGNOSIS — Z7982 Long term (current) use of aspirin: Secondary | ICD-10-CM

## 2023-07-27 DIAGNOSIS — K760 Fatty (change of) liver, not elsewhere classified: Secondary | ICD-10-CM | POA: Diagnosis present

## 2023-07-27 DIAGNOSIS — J9601 Acute respiratory failure with hypoxia: Secondary | ICD-10-CM | POA: Diagnosis present

## 2023-07-27 DIAGNOSIS — D649 Anemia, unspecified: Secondary | ICD-10-CM | POA: Diagnosis not present

## 2023-07-27 DIAGNOSIS — N170 Acute kidney failure with tubular necrosis: Secondary | ICD-10-CM | POA: Diagnosis present

## 2023-07-27 DIAGNOSIS — Z888 Allergy status to other drugs, medicaments and biological substances status: Secondary | ICD-10-CM

## 2023-07-27 DIAGNOSIS — F17210 Nicotine dependence, cigarettes, uncomplicated: Secondary | ICD-10-CM | POA: Diagnosis present

## 2023-07-27 DIAGNOSIS — D689 Coagulation defect, unspecified: Secondary | ICD-10-CM | POA: Diagnosis not present

## 2023-07-27 DIAGNOSIS — N179 Acute kidney failure, unspecified: Secondary | ICD-10-CM

## 2023-07-27 DIAGNOSIS — R34 Anuria and oliguria: Secondary | ICD-10-CM | POA: Diagnosis present

## 2023-07-27 DIAGNOSIS — Z9911 Dependence on respirator [ventilator] status: Secondary | ICD-10-CM | POA: Diagnosis not present

## 2023-07-27 DIAGNOSIS — M6281 Muscle weakness (generalized): Secondary | ICD-10-CM | POA: Diagnosis not present

## 2023-07-27 DIAGNOSIS — Z79899 Other long term (current) drug therapy: Secondary | ICD-10-CM

## 2023-07-27 DIAGNOSIS — Z803 Family history of malignant neoplasm of breast: Secondary | ICD-10-CM

## 2023-07-27 DIAGNOSIS — Z87412 Personal history of vulvar dysplasia: Secondary | ICD-10-CM

## 2023-07-27 DIAGNOSIS — R531 Weakness: Secondary | ICD-10-CM | POA: Diagnosis not present

## 2023-07-27 DIAGNOSIS — M1990 Unspecified osteoarthritis, unspecified site: Secondary | ICD-10-CM | POA: Diagnosis present

## 2023-07-27 DIAGNOSIS — Z800 Family history of malignant neoplasm of digestive organs: Secondary | ICD-10-CM

## 2023-07-27 DIAGNOSIS — Z833 Family history of diabetes mellitus: Secondary | ICD-10-CM

## 2023-07-27 DIAGNOSIS — A419 Sepsis, unspecified organism: Secondary | ICD-10-CM | POA: Diagnosis not present

## 2023-07-27 DIAGNOSIS — T380X5A Adverse effect of glucocorticoids and synthetic analogues, initial encounter: Secondary | ICD-10-CM | POA: Diagnosis not present

## 2023-07-27 DIAGNOSIS — E871 Hypo-osmolality and hyponatremia: Secondary | ICD-10-CM | POA: Diagnosis not present

## 2023-07-27 DIAGNOSIS — D631 Anemia in chronic kidney disease: Secondary | ICD-10-CM | POA: Diagnosis not present

## 2023-07-27 DIAGNOSIS — K922 Gastrointestinal hemorrhage, unspecified: Secondary | ICD-10-CM | POA: Diagnosis not present

## 2023-07-27 DIAGNOSIS — Z4682 Encounter for fitting and adjustment of non-vascular catheter: Secondary | ICD-10-CM | POA: Diagnosis not present

## 2023-07-27 DIAGNOSIS — E860 Dehydration: Secondary | ICD-10-CM | POA: Diagnosis present

## 2023-07-27 DIAGNOSIS — A4152 Sepsis due to Pseudomonas: Secondary | ICD-10-CM | POA: Diagnosis not present

## 2023-07-27 DIAGNOSIS — Z8701 Personal history of pneumonia (recurrent): Secondary | ICD-10-CM

## 2023-07-27 DIAGNOSIS — J449 Chronic obstructive pulmonary disease, unspecified: Secondary | ICD-10-CM | POA: Diagnosis not present

## 2023-07-27 DIAGNOSIS — J9811 Atelectasis: Secondary | ICD-10-CM | POA: Diagnosis not present

## 2023-07-27 DIAGNOSIS — K5730 Diverticulosis of large intestine without perforation or abscess without bleeding: Secondary | ICD-10-CM | POA: Diagnosis not present

## 2023-07-27 DIAGNOSIS — E161 Other hypoglycemia: Secondary | ICD-10-CM | POA: Diagnosis not present

## 2023-07-27 DIAGNOSIS — Z90710 Acquired absence of both cervix and uterus: Secondary | ICD-10-CM

## 2023-07-27 DIAGNOSIS — R578 Other shock: Secondary | ICD-10-CM | POA: Diagnosis not present

## 2023-07-27 DIAGNOSIS — I959 Hypotension, unspecified: Secondary | ICD-10-CM | POA: Diagnosis not present

## 2023-07-27 DIAGNOSIS — Z85828 Personal history of other malignant neoplasm of skin: Secondary | ICD-10-CM

## 2023-07-27 HISTORY — DX: Alcohol dependence, uncomplicated: F10.20

## 2023-07-27 LAB — COMPREHENSIVE METABOLIC PANEL
ALT: 32 U/L (ref 0–44)
AST: 67 U/L — ABNORMAL HIGH (ref 15–41)
Albumin: 3.2 g/dL — ABNORMAL LOW (ref 3.5–5.0)
Alkaline Phosphatase: 208 U/L — ABNORMAL HIGH (ref 38–126)
Anion gap: 28 — ABNORMAL HIGH (ref 5–15)
BUN: 37 mg/dL — ABNORMAL HIGH (ref 8–23)
CO2: 14 mmol/L — ABNORMAL LOW (ref 22–32)
Calcium: 7 mg/dL — ABNORMAL LOW (ref 8.9–10.3)
Chloride: 80 mmol/L — ABNORMAL LOW (ref 98–111)
Creatinine, Ser: 6.84 mg/dL — ABNORMAL HIGH (ref 0.44–1.00)
GFR, Estimated: 6 mL/min — ABNORMAL LOW (ref >60)
Glucose, Bld: 84 mg/dL (ref 70–99)
Potassium: 2 mmol/L — CL (ref 3.5–5.1)
Sodium: 122 mmol/L — ABNORMAL LOW (ref 135–145)
Total Bilirubin: 1 mg/dL (ref 0.3–1.2)
Total Protein: 6.3 g/dL — ABNORMAL LOW (ref 6.5–8.1)

## 2023-07-27 LAB — DIFFERENTIAL
Abs Immature Granulocytes: 0.05 10*3/uL (ref 0.00–0.07)
Basophils Absolute: 0 10*3/uL (ref 0.0–0.1)
Basophils Relative: 0 %
Eosinophils Absolute: 0 10*3/uL (ref 0.0–0.5)
Eosinophils Relative: 0 %
Immature Granulocytes: 0 %
Lymphocytes Relative: 3 %
Lymphs Abs: 0.5 10*3/uL — ABNORMAL LOW (ref 0.7–4.0)
Monocytes Absolute: 0.3 10*3/uL (ref 0.1–1.0)
Monocytes Relative: 2 %
Neutro Abs: 14.3 10*3/uL — ABNORMAL HIGH (ref 1.7–7.7)
Neutrophils Relative %: 95 %

## 2023-07-27 LAB — APTT: aPTT: 39 s — ABNORMAL HIGH (ref 24–36)

## 2023-07-27 LAB — CBC
HCT: 27.3 % — ABNORMAL LOW (ref 36.0–46.0)
Hemoglobin: 8.8 g/dL — ABNORMAL LOW (ref 12.0–15.0)
MCH: 33.2 pg (ref 26.0–34.0)
MCHC: 32.2 g/dL (ref 30.0–36.0)
MCV: 103 fL — ABNORMAL HIGH (ref 80.0–100.0)
Platelets: 227 10*3/uL (ref 150–400)
RBC: 2.65 MIL/uL — ABNORMAL LOW (ref 3.87–5.11)
RDW: 16 % — ABNORMAL HIGH (ref 11.5–15.5)
WBC: 15.2 10*3/uL — ABNORMAL HIGH (ref 4.0–10.5)
nRBC: 0.7 % — ABNORMAL HIGH (ref 0.0–0.2)

## 2023-07-27 LAB — CK: Total CK: 519 U/L — ABNORMAL HIGH (ref 38–234)

## 2023-07-27 LAB — CBG MONITORING, ED: Glucose-Capillary: 109 mg/dL — ABNORMAL HIGH (ref 70–99)

## 2023-07-27 LAB — PROTIME-INR
INR: 1.2 (ref 0.8–1.2)
Prothrombin Time: 15.5 s — ABNORMAL HIGH (ref 11.4–15.2)

## 2023-07-27 LAB — LACTIC ACID, PLASMA: Lactic Acid, Venous: 4.3 mmol/L (ref 0.5–1.9)

## 2023-07-27 LAB — ETHANOL: Alcohol, Ethyl (B): 10 mg/dL — ABNORMAL HIGH (ref <10)

## 2023-07-27 MED ORDER — MAGNESIUM SULFATE 2 GM/50ML IV SOLN
2.0000 g | INTRAVENOUS | Status: AC
  Administered 2023-07-28: 2 g via INTRAVENOUS
  Filled 2023-07-27: qty 50

## 2023-07-27 MED ORDER — NOREPINEPHRINE 4 MG/250ML-% IV SOLN
0.0000 ug/min | INTRAVENOUS | Status: DC
  Administered 2023-07-27: 5 ug/min via INTRAVENOUS
  Administered 2023-07-28: 12 ug/min via INTRAVENOUS
  Filled 2023-07-27: qty 250

## 2023-07-27 MED ORDER — LACTATED RINGERS IV BOLUS
1000.0000 mL | Freq: Once | INTRAVENOUS | Status: AC
  Administered 2023-07-27: 1000 mL via INTRAVENOUS

## 2023-07-27 MED ORDER — PIPERACILLIN-TAZOBACTAM 3.375 G IVPB 30 MIN
3.3750 g | Freq: Once | INTRAVENOUS | Status: AC
  Administered 2023-07-27: 3.375 g via INTRAVENOUS
  Filled 2023-07-27: qty 50

## 2023-07-27 MED ORDER — VANCOMYCIN HCL IN DEXTROSE 1-5 GM/200ML-% IV SOLN
1000.0000 mg | Freq: Once | INTRAVENOUS | Status: AC
  Administered 2023-07-27: 1000 mg via INTRAVENOUS
  Filled 2023-07-27: qty 200

## 2023-07-27 MED ORDER — POTASSIUM CHLORIDE 10 MEQ/100ML IV SOLN
10.0000 meq | Freq: Once | INTRAVENOUS | Status: AC
  Filled 2023-07-27: qty 100

## 2023-07-27 MED ORDER — POTASSIUM CHLORIDE 10 MEQ/100ML IV SOLN
10.0000 meq | Freq: Once | INTRAVENOUS | Status: AC
  Administered 2023-07-28: 10 meq via INTRAVENOUS

## 2023-07-27 NOTE — ED Notes (Signed)
Spoke with DR Hyacinth Meeker who instructed to activate code stroke since there is uncertainty of LKW.

## 2023-07-27 NOTE — ED Notes (Signed)
Sister in law Marylu Lund leaving for evening would like updates with care correct number in chart.

## 2023-07-27 NOTE — ED Notes (Signed)
pt drove self to sister's house on Sunday and that was the last time family had seen her "normal" was weak, family says pt is alcholic and does not take care of self. This info provided by sister in law- Dr Hyacinth Meeker present during conversation. Pt sister in law Reports pt falling prior to today as well.

## 2023-07-27 NOTE — ED Notes (Signed)
Dr. Hyacinth Meeker at bedside placing central line.

## 2023-07-27 NOTE — Consult Note (Addendum)
[facility] Consult Services   Patient Name:   Carol Rowe, Carol Rowe Date of Birth:   1955-10-10 Identification Number:   MRN - [id number] Date of Service:   07/27/2023 21:41:46  Diagnosis:       G93.41 - Encephalopathy Metabolic  Impression:      67 year old female with history of arthritis, hypertension, who I am seeing as a stroke alert for left hand numbness and weakness.    Patient being seen after she was found down with altered mental status, generalized weakness for the last couple of days and left hand and facial numbness. Neuroexam showing decrease sensation on the left hand but left facial numbness has improved, no focal weakness in the arms or legs, bilateral lower extremity ataxia. Blood glucose initially 32,  blood pressure 86/57.    At this time, I suspect patient likely has metabolic encephalopathy from hypoglycemia on top of hypotension and possibly alcohol intoxication. Would correct any metabolic derangements and normalize BP.Marland Kitchen If there is persistent neurological deficit , would obtain MRI brain noncontrast to rule out stroke. Patient not a thrombolytic candidate with symptoms starting yesterday of left hand numbness. Continue aspirin 81 mg.  Our recommendations are outlined below.  Recommendations:        Neuro Checks       Bedside Swallow Eval       Initiate or continue Aspirin 81 MG daily       -Metabolic work up       -Ethanol level       -MRI Brain if new persisting neuro deficit  Sign Out:       Discussed with Emergency Department Provider    ------------------------------------------------------------------------------  Advanced Imaging: Advanced Imaging Deferred because:  Non-disabling symptoms as verified by the patient; no cortical signs so not consistent with LVO   Metrics: Last Known Well: Unknown [facility] Notification Time: 07/27/2023 21:41:46 Arrival Time: 07/27/2023 21:07:00 Stamp Time: 07/27/2023  21:41:46 Initial Response Time: 07/27/2023 21:43:38 Symptoms: fall, L hand numbness . Initial patient interaction: 07/27/2023 21:49:49 NIHSS Assessment Completed: 07/27/2023 21:51:42 Patient is not a candidate for Thrombolytic. Thrombolytic Medical Decision: 07/27/2023 21:51:43 Patient was not deemed candidate for Thrombolytic because of following reasons: LKW outside 4.5 hr window. .  I personally Reviewed the CT Head and it Showed no acute process.  Primary Provider Notified of Diagnostic Impression and Management Plan on: 07/27/2023 22:01:08    ------------------------------------------------------------------------------  History of Present Illness: Patient is a 67 year old Female.  Patient was brought by EMS for symptoms of fall, L hand numbness . 67 year old female with history of arthritis, hypertension, who I am seeing as a stroke alert for left hand numbness and weakness.  Patient was at home and was last seen normal by family on Sunday. She had paramedics come and to her house and saw that she was found on the ground. She drinks alcohol daily. Patient does not remember what happened exactly. Per paramedics her blood glucose was 30 and blood pressure was 65 systolic on arrival. Today she only states drinking 1 alcoholic drink with vodka. She is having left hand numbness that started yesterday she is also having generalized weakness for the last couple of days. She takes aspirin 81 mg at home   Medications:  No Anticoagulant use  Antiplatelet use: Yes asa Reviewed EMR for current medications  Allergies:  Reviewed  Social History: Alcohol Use: Yes  Family History:  There is no family history of premature cerebrovascular disease pertinent to this consultation  ROS :  14 Points Review of Systems was performed and was negative except mentioned in HPI.  Past Surgical History: There Is No Surgical History Contributory To Today's Visit    Examination: BP(85/55),  Pulse(55), 1A: Level of Consciousness - Alert; keenly responsive + 0 1B: Ask Month and Age - Both Questions Right + 0 1C: Blink Eyes & Squeeze Hands - Performs Both Tasks + 0 2: Test Horizontal Extraocular Movements - Normal + 0 3: Test Visual Fields - No Visual Loss + 0 4: Test Facial Palsy (Use Grimace if Obtunded) - Normal symmetry + 0 5A: Test Left Arm Motor Drift - No Drift for 10 Seconds + 0 5B: Test Right Arm Motor Drift - No Drift for 10 Seconds + 0 6A: Test Left Leg Motor Drift - No Drift for 5 Seconds + 0 6B: Test Right Leg Motor Drift - No Drift for 5 Seconds + 0 7: Test Limb Ataxia (FNF/Heel-Shin) - Ataxia in 1 Limb + 1 8: Test Sensation - Mild-Moderate Loss: Less Sharp/More Dull + 1 9: Test Language/Aphasia - Normal; No aphasia + 0 10: Test Dysarthria - Mild-Moderate Dysarthria: Slurring but can be understood + 1 11: Test Extinction/Inattention - No abnormality + 0  NIHSS Score: 3   Pre-Morbid Modified Rankin Scale: Unable to assess  Spoke with : Dr Hyacinth Meeker  This consult was conducted in real time using interactive audio and Immunologist. Patient was informed of the technology being used for this visit and agreed to proceed. Patient located in hospital and provider located at home/office setting.   Patient is being evaluated for possible acute neurologic impairment and high probability of imminent or life-threatening deterioration. I spent total of 35 minutes providing care to this patient, including time for face to face visit via telemedicine, review of medical records, imaging studies and discussion of findings with providers, the patient and/or family.   Dr Scherrie Gerlach   [facility] For Inpatient follow-up with [facility] physician please call [facility] [phone]. This is not an outpatient service. Post hospital discharge, please contact hospital directly.  Please do not communicate with [facility] physicians via secure chat. If you have any  questions, Please contact [facility]. Please call or reconsult our service if there are any clinical or diagnostic changes.

## 2023-07-27 NOTE — ED Provider Notes (Signed)
[place] EMERGENCY DEPARTMENT AT [facility] Provider Note   CSN: [id number] Arrival date & time: 07/27/23  2107  An emergency department physician performed an initial assessment on this suspected stroke patient at 2127.  History  Chief Complaint  Patient presents with   Hypoglycemia   Hypotension   Fall    Carol Rowe is a 67 y.o. female.   Hypoglycemia Fall   Patient is a 65-year-old female with a history of alcohol abuse, she is on a baby aspirin, she also has a history of essential hypertension, a history of COPD, she is a smoker, she has a history of gout, she has not been admitted to the hospital anytime recently.  There is a reported history of heavy alcohol use according to the paramedics who found the patient on the floor of her house.  Evidently a family member went over to the house and found the patient had fallen, the patient tells me that she think she was walking to the bathroom when she passed out and fell and struck her head.  She does not remember exactly when it happened, she cannot tell me anything else about it, the paramedics report that she was hypoglycemic around 30 to, she was given some medication to correct her sugar prehospital.  Family arrived a short time after patient went to CT and states that they last saw her normal on Sunday (6 days ago), that she was able to drive her self but was very weak.  The sister who is here states that she saw her at 4:30 in the afternoon when she was sitting on the couch, she was not talking normally, she then got a call from a family member that the patient had fallen, the patient was able to crawl to the door to the open the door but appeared severely weak.    Home Medications Prior to Admission medications   Medication Sig Start Date End Date Taking? Authorizing Provider  acetaminophen (TYLENOL) 500 MG tablet Take 1,000 mg by mouth every 6 (six) hours as needed for moderate pain.    [provider]  albuterol (VENTOLIN HFA) 108 (90 Base) MCG/ACT inhaler Inhale 2 puffs into the lungs 2 (two) times daily as needed for shortness of breath. 11/11/19   [provider]  ALPRAZolam Prudy Feeler) 0.5 MG tablet Take 0.5 mg by mouth 3 (three) times daily as needed for anxiety.     [provider]  ascorbic acid (VITAMIN C) 500 MG tablet Take 500 mg by mouth every other day.    [provider]  aspirin EC 325 MG tablet Take 325 mg by mouth daily as needed (headaches).    [provider]  B Complex Vitamins (B COMPLEX PO) Take 1 Dose by mouth every other day. Liquid b complex    [provider]  cetirizine (ZYRTEC) 10 MG tablet Take 10 mg by mouth daily as needed for allergies.    [provider]  Cholecalciferol (DIALYVITE VITAMIN D 5000) 125 MCG (5000 UT) capsule Take 5,000 Units by mouth every other day.    [provider]  estradiol (VIVELLE-DOT) 0.1 MG/24HR Place 1 patch onto the skin 2 (two) times a week.    [provider]  fluticasone (FLONASE) 50 MCG/ACT nasal spray Place 2 sprays into both nostrils daily.    [provider]  gabapentin (NEURONTIN) 300 MG capsule Take 300 mg by mouth at bedtime.    [provider]  Glycerin-Polysorbate 80 (REFRESH  DRY EYE THERAPY OP) Place 1 drop into both eyes daily as needed (dry eyes).    [provider]  levocetirizine (XYZAL) 5 MG tablet Take 5 mg by mouth at bedtime.    [provider]  Magnesium 400 MG CAPS Take 400 mg by mouth every other day.    [provider]  Menthol-Methyl Salicylate (SALONPAS PAIN RELIEF PATCH EX) Apply 1-3 patches topically daily as needed (pain).    [provider]  OVER THE COUNTER MEDICATION Take 6 tablets by mouth daily as needed (constipation). Tam herbal laxative    [provider]  pantoprazole (PROTONIX) 40 MG tablet Take 40 mg by mouth See admin instructions. Take 40 mg daily may take a second 40  mg dose as needed for heartburn    [provider]  potassium chloride (KLOR-CON) 10 MEQ tablet Take 10 mEq by mouth daily.    [provider]  Probiotic CAPS Take 1 capsule by mouth every other day.    [provider]  ramipril (ALTACE) 2.5 MG capsule Take 2.5 mg by mouth daily.    [provider]  torsemide (DEMADEX) 20 MG tablet Take 20-40 mg by mouth See admin instructions. Take 20 or 40 mg once daily in the morning    [provider]  trolamine salicylate (ASPERCREME) 10 % cream Apply 1 application topically as needed for muscle pain.    [provider]  Turmeric Curcumin 500 MG CAPS Take 500 mg by mouth every other day.    [provider]  valACYclovir (VALTREX) 1000 MG tablet Take 500-1,000 mg by mouth See admin instructions. Take 500 mg daily, may take 1000 mg instead if currently breaking out    [provider]  vitamin E 180 MG (400 UNITS) capsule Take 400 Units by mouth every other day.    [provider]  Wheat Dextrin (BENEFIBER DRINK MIX) PACK Take 4 g by mouth at bedtime. 12/10/19   Carol Hippo, MD  Zinc 50 MG CAPS Take 50 mg by mouth every other day.    [provider]      Allergies    Macrobid [nitrofurantoin macrocrystal] and Mirtazapine    Review of Systems   Review of Systems  All other systems reviewed and are negative.   Physical Exam Updated Vital Signs BP (!) 81/68   Pulse (!) 59   Temp (!) 89.3 F (31.8 C)   Resp 16   Ht 1.6 m (5\' 3" )   Wt 55.8 kg   SpO2 91%   BMI 21.79 kg/m  Physical Exam Vitals and nursing note reviewed.  Constitutional:      General: She is not in acute distress.    Appearance: She is well-developed.  HENT:     Head: Normocephalic.     Comments: Hematoma to the posterior occiput    Mouth/Throat:     Pharynx: No oropharyngeal exudate.  Eyes:     General: No scleral icterus.       Right eye: No discharge.        Left eye: No discharge.      Conjunctiva/sclera: Conjunctivae normal.     Pupils: Pupils are equal, round, and reactive to light.  Neck:     Thyroid: No thyromegaly.     Vascular: No JVD.  Cardiovascular:     Rate and Rhythm: Normal rate and regular rhythm.     Heart sounds: Normal heart sounds. No murmur heard.    No friction  rub. No gallop.  Pulmonary:     Effort: Pulmonary effort is normal. No respiratory distress.     Breath sounds: Normal breath sounds. No wheezing or rales.  Abdominal:     General: Bowel sounds are normal. There is no distension.     Palpations: Abdomen is soft. There is no mass.     Tenderness: There is no abdominal tenderness.  Musculoskeletal:        General: No tenderness. Normal range of motion.     Cervical back: Normal range of motion and neck supple.     Right lower leg: No edema.     Left lower leg: No edema.  Lymphadenopathy:     Cervical: No cervical adenopathy.  Skin:    General: Skin is warm and dry.     Findings: No erythema or rash.  Neurological:     Mental Status: She is alert.     Coordination: Coordination normal.     Comments: The patient has left-sided leg and arm weakness with dysmetria of the left arm, she is able to answer questions but seems confused, otherwise cranial nerves are unremarkable, she has normal sensation bilateral upper and lower extremities, 4-1/2 out of 5 strength left upper left lower extremity  Psychiatric:        Behavior: Behavior normal.     ED Results / Procedures / Treatments   Labs (all labs ordered are listed, but only abnormal results are displayed) Labs Reviewed  ETHANOL - Abnormal; Notable for the following components:      Result Value   Alcohol, Ethyl (B) 10 (*)    All other components within normal limits  PROTIME-INR - Abnormal; Notable for the following components:   Prothrombin Time 15.5 (*)    All other components within normal limits  APTT - Abnormal; Notable for the following components:   aPTT 39 (*)    All  other components within normal limits  CBC - Abnormal; Notable for the following components:   WBC 15.2 (*)    RBC 2.65 (*)    Hemoglobin 8.8 (*)    HCT 27.3 (*)    MCV 103.0 (*)    RDW 16.0 (*)    nRBC 0.7 (*)    All other components within normal limits  DIFFERENTIAL - Abnormal; Notable for the following components:   Neutro Abs 14.3 (*)    Lymphs Abs 0.5 (*)    All other components within normal limits  COMPREHENSIVE METABOLIC PANEL - Abnormal; Notable for the following components:   Sodium 122 (*)    Potassium 2.0 (*)    Chloride 80 (*)    CO2 14 (*)    BUN 37 (*)    Creatinine, Ser 6.84 (*)    Calcium 7.0 (*)    Total Protein 6.3 (*)    Albumin 3.2 (*)    AST 67 (*)    Alkaline Phosphatase 208 (*)    GFR, Estimated 6 (*)    Anion gap 28 (*)    All other components within normal limits  CK - Abnormal; Notable for the following components:   Total CK 519 (*)    All other components within normal limits  LACTIC ACID, PLASMA - Abnormal; Notable for the following components:   Lactic Acid, Venous 4.3 (*)    All other components within normal limits  CBG MONITORING, ED - Abnormal; Notable for the following components:   Glucose-Capillary 109 (*)    All other components within normal limits  CULTURE, BLOOD (ROUTINE X 2)  CULTURE, BLOOD (ROUTINE X 2)  RAPID URINE DRUG SCREEN, HOSP PERFORMED  URINALYSIS, ROUTINE W REFLEX MICROSCOPIC  CBG MONITORING, ED  I-STAT CHEM 8, ED    EKG EKG Interpretation Date/Time:  Friday July 27 2023 21:28:47 EDT Ventricular Rate:  69 PR Interval:  63 QRS Duration:  123 QT Interval:  627 QTC Calculation: 600 R Axis:   60  Text Interpretation: Sinus rhythm Paired ventricular premature complexes Short PR interval Nonspecific intraventricular conduction delay Confirmed by Eber Hong ([id number]) on 07/27/2023 9:45:47 PM  Radiology CT Cervical Spine Wo Contrast  Result Date: 07/27/2023 CLINICAL DATA:  Fall EXAM: CT CERVICAL SPINE  WITHOUT CONTRAST TECHNIQUE: Multidetector CT imaging of the cervical spine was performed without intravenous contrast. Multiplanar CT image reconstructions were also generated. RADIATION DOSE REDUCTION: This exam was performed according to the departmental dose-optimization program which includes automated exposure control, adjustment of the mA and/or kV according to patient size and/or use of iterative reconstruction technique. COMPARISON:  None Available. FINDINGS: Alignment: No traumatic listhesis.  Trace anterolisthesis C4 on C5. Skull base and vertebrae: No acute fracture. No primary bone lesion or focal pathologic process. Soft tissues and spinal canal: No prevertebral fluid or swelling. No visible canal hematoma. Left thyroid nodule measures up to 2.9 cm. Disc levels: Degenerative changes in the cervical spine. No high-grade spinal canal stenosis. Upper chest: No focal pulmonary opacity or pleural effusion. Emphysema. IMPRESSION: 1. No acute cervical spine fracture or traumatic listhesis. 2. Left thyroid nodule measuring up to 2.9 cm. If this has not previously been evaluated, a non-emergent ultrasound of the thyroid is recommended. (Reference: J Am Coll Radiol. 2015 Feb;12(2): 143-50) 3. Emphysema. Emphysema (ICD10-J43.9). Electronically Signed   By: Wiliam Ke M.D.   On: 07/27/2023 21:58   CT HEAD CODE STROKE WO CONTRAST  Result Date: 07/27/2023 CLINICAL DATA:  Code stroke. Hypoglycemia, neuro deficit, weak for several days EXAM: CT HEAD WITHOUT CONTRAST TECHNIQUE: Contiguous axial images were obtained from the base of the skull through the vertex without intravenous contrast. RADIATION DOSE REDUCTION: This exam was performed according to the departmental dose-optimization program which includes automated exposure control, adjustment of the mA and/or kV according to patient size and/or use of iterative reconstruction technique. COMPARISON:  None Available. FINDINGS: Brain: No evidence of acute  infarction, hemorrhage, mass, mass effect, or midline shift. No hydrocephalus or extra-axial collection. Periventricular white matter changes, likely the sequela of chronic small vessel ischemic disease. Vascular: No hyperdense vessel. Skull: Negative for fracture or focal lesion. Sinuses/Orbits: No acute finding. Other: The mastoid air cells are well aerated. ASPECTS [facility] Stroke Program Early CT Score) - Ganglionic level infarction (caudate, lentiform nuclei, internal capsule, insula, M1-M3 cortex): 7 - Supraganglionic infarction (M4-M6 cortex): 3 Total score (0-10 with 10 being normal): 10 IMPRESSION: 1. No acute intracranial process. 2. ASPECTS is 10. Code stroke imaging results were communicated on 07/27/2023 at 9:55 pm to provider Hyacinth Meeker via telephone, who verbally acknowledged these results. Electronically Signed   By: Wiliam Ke M.D.   On: 07/27/2023 21:55    Procedures .Critical Care  Performed by: Eber Hong, MD Authorized by: Eber Hong, MD   Critical care provider statement:    Critical care time (minutes):  75   Critical care time was exclusive of:  Separately billable procedures and treating other patients   Critical care was necessary to treat or prevent imminent or life-threatening deterioration of the following conditions:  Sepsis and shock   Critical  care was time spent personally by me on the following activities:  Development of treatment plan with patient or surrogate, discussions with consultants, evaluation of patient's response to treatment, examination of patient, obtaining history from patient or surrogate, review of old charts, re-evaluation of patient's condition, pulse oximetry, ordering and review of radiographic studies, ordering and review of laboratory studies and ordering and performing treatments and interventions   I assumed direction of critical care for this patient from another provider in my specialty: no     Care discussed with: admitting provider    Comments:       .Central Line  Date/Time: 07/27/2023 11:35 PM  Performed by: Eber Hong, MD Authorized by: Eber Hong, MD   Consent:    Consent obtained:  Emergent situation Universal protocol:    Procedure explained and questions answered to patient or proxy's satisfaction: yes     Immediately prior to procedure, a time out was called: yes     Patient identity confirmed:  Verbally with patient Pre-procedure details:    Indication(s): central venous access     Hand hygiene: Hand hygiene performed prior to insertion     Sterile barrier technique: All elements of maximal sterile technique followed     Skin preparation:  Chlorhexidine   Skin preparation agent: Skin preparation agent completely dried prior to procedure   Sedation:    Sedation type:  None Anesthesia:    Anesthesia method:  None Procedure details:    Location:  R internal jugular   Patient position:  Supine   Procedural supplies:  Triple lumen   Catheter size:  7 Fr   Landmarks identified: yes     Ultrasound guidance: yes     Ultrasound guidance timing: prior to insertion and real time     Sterile ultrasound techniques: Sterile gel and sterile probe covers were used     Number of attempts:  1   Successful placement: yes   Post-procedure details:    Post-procedure:  Dressing applied and line sutured   Assessment:  Blood return through all ports, no pneumothorax on x-ray, free fluid flow and placement verified by x-ray   Procedure completion:  Tolerated Comments:           Medications Ordered in ED Medications  vancomycin (VANCOCIN) IVPB 1000 mg/200 mL premix (1,000 mg Intravenous New Bag/Given 07/27/23 2305)  norepinephrine (LEVOPHED) 4mg  in (0.016 mg/mL) premix infusion (5 mcg/min Intravenous Restarted 07/27/23 2332)  lactated ringers bolus 1,000 mL (0 mLs Intravenous Stopped 07/27/23 2250)  piperacillin-tazobactam (ZOSYN) IVPB 3.375 g (0 g Intravenous Stopped 07/27/23 2319)  lactated  ringers bolus 1,000 mL (0 mLs Intravenous Stopped 07/27/23 2332)    ED Course/ Medical Decision Making/ A&P                                 Medical Decision Making Amount and/or Complexity of Data Reviewed Labs: ordered. Radiology: ordered.  Risk Prescription drug management. Decision regarding hospitalization.    This patient presents to the ED for concern of altered mental status with left-sided focal deficits and a head injury, this involves an extensive number of treatment options, and is a complaint that carries with it a high risk of complications and morbidity.  The differential diagnosis includes stroke, seizure, hypoglycemia, I do not see any medications for diabetes, this could be toxin related or overdose related as she does have benzodiazepines on her medication list  Co morbidities that complicate the patient evaluation  Hypertension, tobacco use, potential alcohol use   Additional history obtained:  Additional history obtained from family members reportedly last saw her normal on Sunday External records from outside source obtained and reviewed including paramedics report   Lab Tests:  I Ordered, and personally interpreted labs.  The pertinent results include: Alcohol level of 10, CBC shows anemia with hemoglobin of 8.8, platelets are normal, leukocytosis of 15,000, CT CAP just over 500 and a glucose of 109.   Imaging Studies ordered:  I ordered imaging studies including CT scans of the head and cervical spine I independently visualized and interpreted imaging which showed no fractures or intracranial abnormalities, there is a thyroid abnormality I agree with the radiologist interpretation   Cardiac Monitoring: / EKG:  The patient was maintained on a cardiac monitor.  I personally viewed and interpreted the cardiac monitored which showed an underlying rhythm of: The patient is borderline bradycardic and persistently hypotensive   Consultations  Obtained:  I requested consultation with the intensivist,  and discussed lab and imaging findings as well as pertinent plan - they recommend: Recommend admission to ICU.  D/w Dr. Larinda Buttery - who accepted to [facility] ICU   Problem List / ED Course / Critical interventions / Medication management  The patient was hypothermic, hypoglycemic, hypotensive, covered for sepsis that antibiotics, cultures and lactate ordered, she was hypothermic and required active warming, blood pressure was treated with IV fluids and antibiotics I ordered medication including antibiotics including vancomycin, Zosyn, 30 cc/kg of IV fluids and Levophed for what appears to be shock associated with hypothermia and possibly sepsis Reevaluation of the patient after these medicines showed that the patient critically ill I have reviewed the patients home medicines and have made adjustments as needed Ultimately the patient required a central line secondary to very poor peripheral access.  I placed a triple-lumen 7 French central line in the patient's right internal jugular under ultrasound guidance on 1 attempt.  X-ray confirms placement and shows no signs of pneumothorax.  The patient is somnolent, arousable to loud voice and painful stimuli but persistently hypotensive, hypothermic but and gradually improving, Foley catheter placed with temperature gauge, unsure of exact etiology of the patient's symptoms but she does appear critically ill and is anuric.  Additionally the labs show that the patient is hypokalemic, hyponatremic and in renal failure. Treatment for the shock and the renal failure as well as the hyponatremia required fluid resuscitation.  She will need a repeat sodium to make sure that she is not becoming normal natremic too quickly however at this time given her instability a pressor was started.   Social Determinants of Health:  Alcoholism   Test / Admission - Considered:  Admit to higher level of  care         Final Clinical Impression(s) / ED Diagnoses Final diagnoses:  Hypothermia, initial encounter  Shock (HCC)  Anuria  Acute encephalopathy  Weakness of left arm     Eber Hong, MD 07/28/23 0000

## 2023-07-27 NOTE — ED Notes (Signed)
Blood cultures and lactic being drawn now

## 2023-07-27 NOTE — ED Notes (Signed)
Code stroke paged out via [facility]

## 2023-07-27 NOTE — ED Notes (Signed)
Paged out critical Care via [facility]

## 2023-07-27 NOTE — ED Provider Notes (Signed)
11:52 PM Assumed care from Dr. Hyacinth Meeker, please see their note for full history, physical and decision making until this point. In brief this is a 67 y.o. year old female who presented to the ED tonight with Hypoglycemia, Hypotension, and Fall     Alcoholic. Last seen a few days ago weak. Today with left sided weakness. Anuric, hypothermic, hypotensive. ARF, hypoK, hypomag, hypoglycemic.  Has had fluids - - > now on levophed.  Getting sugar, Na, K, etc. Code stroke canceled 2/2 timeframe, not sure if she had one as she is significantly altered.   MS stable during my care. No further medications needed. ABG slightly abnormal but likely mixed gas as the O2 sat wasn't c/w pulse ox.   CRITICAL CARE Performed by: Marily Memos Total critical care time: 32 minutes Critical care time was exclusive of separately billable procedures and treating other patients. Critical care was necessary to treat or prevent imminent or life-threatening deterioration. Critical care was time spent personally by me on the following activities: development of treatment plan with patient and/or surrogate as well as nursing, discussions with consultants, evaluation of patient's response to treatment, examination of patient, obtaining history from patient or surrogate, ordering and performing treatments and interventions, ordering and review of laboratory studies, ordering and review of radiographic studies, pulse oximetry and re-evaluation of patient's condition.   Labs, studies and imaging reviewed by myself and considered in medical decision making if ordered. Imaging interpreted by radiology.  Labs Reviewed  ETHANOL - Abnormal; Notable for the following components:      Result Value   Alcohol, Ethyl (B) 10 (*)    All other components within normal limits  PROTIME-INR - Abnormal; Notable for the following components:   Prothrombin Time 15.5 (*)    All other components within normal limits  APTT - Abnormal; Notable for  the following components:   aPTT 39 (*)    All other components within normal limits  CBC - Abnormal; Notable for the following components:   WBC 15.2 (*)    RBC 2.65 (*)    Hemoglobin 8.8 (*)    HCT 27.3 (*)    MCV 103.0 (*)    RDW 16.0 (*)    nRBC 0.7 (*)    All other components within normal limits  DIFFERENTIAL - Abnormal; Notable for the following components:   Neutro Abs 14.3 (*)    Lymphs Abs 0.5 (*)    All other components within normal limits  COMPREHENSIVE METABOLIC PANEL - Abnormal; Notable for the following components:   Sodium 122 (*)    Potassium 2.0 (*)    Chloride 80 (*)    CO2 14 (*)    BUN 37 (*)    Creatinine, Ser 6.84 (*)    Calcium 7.0 (*)    Total Protein 6.3 (*)    Albumin 3.2 (*)    AST 67 (*)    Alkaline Phosphatase 208 (*)    GFR, Estimated 6 (*)    Anion gap 28 (*)    All other components within normal limits  CK - Abnormal; Notable for the following components:   Total CK 519 (*)    All other components within normal limits  LACTIC ACID, PLASMA - Abnormal; Notable for the following components:   Lactic Acid, Venous 4.3 (*)    All other components within normal limits  CBG MONITORING, ED - Abnormal; Notable for the following components:   Glucose-Capillary 109 (*)    All other components within  normal limits  CULTURE, BLOOD (ROUTINE X 2)  CULTURE, BLOOD (ROUTINE X 2)  RAPID URINE DRUG SCREEN, HOSP PERFORMED  URINALYSIS, ROUTINE W REFLEX MICROSCOPIC  BLOOD GAS, ARTERIAL  CBG MONITORING, ED  I-STAT CHEM 8, ED    DG Chest Port 1 View  Final Result    CT Cervical Spine Wo Contrast  Final Result    CT HEAD CODE STROKE WO CONTRAST  Final Result      No follow-ups on file.    Marily Memos, MD 07/28/23 [phone]

## 2023-07-27 NOTE — ED Notes (Addendum)
Patient transported to CT with stroke cart, Rod, RN and [place], [place]- Rod has attempted IV access twice with no success.

## 2023-07-27 NOTE — ED Notes (Signed)
Rectal temp 89.5 rectal. Dr Hyacinth Meeker made aware. Bair hugger applied.

## 2023-07-27 NOTE — ED Notes (Signed)
Lab made aware RN unable to get IV- pt will need labs drawn. Tori on way to CT at this time.

## 2023-07-27 NOTE — ED Triage Notes (Addendum)
Pt fell at home alone, sister in law called EMS, unknown as to how family was aware of fall- EMS reports pt was alone, lying on back after fall, pt remembers falling, says she has been weak for couple days, pt CBG was 32 when EMS arrived, systolic BP was 65. Pt alert and oriented x 4. Recheck CBG was 109 in triage. Dr Hyacinth Meeker at bedside. EMS told this nurse pt was last seen normal Sunday by family. Pt endorses having one alcoholic beverage, pt also endorses taking blood thinners but does not know the name.

## 2023-07-27 NOTE — Progress Notes (Addendum)
2122 - Code Stroke Activated  LKWT unknown, Patient had a fall called her sister in law to call EMS, L hand numbness, and BLE ataxia, low SBP, & Low BGL with EMS. mRS 0  2136 - Patient to CT   2141 - Tele-Neurologist paged  2148 - Dr. Ruel Favors on stroke cart   2149 - Dr. Ruel Favors assessing patient   2155 - Patient left CT, no advanced imaging needed per Dr. Ruel Favors  2155 - CT head results sent to Dr. Ruel Favors

## 2023-07-27 NOTE — ED Notes (Addendum)
Pt endorses having "one vodka and cranberry". Pt last seen normal was Sunday by family, this was stated by EMS.

## 2023-07-28 ENCOUNTER — Inpatient Hospital Stay (HOSPITAL_COMMUNITY): Payer: Medicare HMO

## 2023-07-28 DIAGNOSIS — R578 Other shock: Secondary | ICD-10-CM | POA: Diagnosis not present

## 2023-07-28 DIAGNOSIS — R579 Shock, unspecified: Secondary | ICD-10-CM | POA: Diagnosis not present

## 2023-07-28 DIAGNOSIS — A419 Sepsis, unspecified organism: Secondary | ICD-10-CM | POA: Diagnosis present

## 2023-07-28 LAB — BASIC METABOLIC PANEL
Anion gap: 22 — ABNORMAL HIGH (ref 5–15)
Anion gap: 23 — ABNORMAL HIGH (ref 5–15)
BUN: 35 mg/dL — ABNORMAL HIGH (ref 8–23)
BUN: 37 mg/dL — ABNORMAL HIGH (ref 8–23)
CO2: 18 mmol/L — ABNORMAL LOW (ref 22–32)
CO2: 19 mmol/L — ABNORMAL LOW (ref 22–32)
Calcium: 6.8 mg/dL — ABNORMAL LOW (ref 8.9–10.3)
Calcium: 6.9 mg/dL — ABNORMAL LOW (ref 8.9–10.3)
Chloride: 80 mmol/L — ABNORMAL LOW (ref 98–111)
Chloride: 83 mmol/L — ABNORMAL LOW (ref 98–111)
Creatinine, Ser: 6.23 mg/dL — ABNORMAL HIGH (ref 0.44–1.00)
Creatinine, Ser: 6.45 mg/dL — ABNORMAL HIGH (ref 0.44–1.00)
GFR, Estimated: 7 mL/min — ABNORMAL LOW (ref >60)
GFR, Estimated: 7 mL/min — ABNORMAL LOW (ref >60)
Glucose, Bld: 200 mg/dL — ABNORMAL HIGH (ref 70–99)
Glucose, Bld: 61 mg/dL — ABNORMAL LOW (ref 70–99)
Potassium: 2.2 mmol/L — CL (ref 3.5–5.1)
Potassium: 2.2 mmol/L — CL (ref 3.5–5.1)
Sodium: 121 mmol/L — ABNORMAL LOW (ref 135–145)
Sodium: 124 mmol/L — ABNORMAL LOW (ref 135–145)

## 2023-07-28 LAB — POCT I-STAT 7, (LYTES, BLD GAS, ICA,H+H)
Acid-base deficit: 7 mmol/L — ABNORMAL HIGH (ref 0.0–2.0)
Acid-base deficit: 9 mmol/L — ABNORMAL HIGH (ref 0.0–2.0)
Acid-base deficit: 9 mmol/L — ABNORMAL HIGH (ref 0.0–2.0)
Acid-base deficit: 14 mmol/L — ABNORMAL HIGH (ref 0.0–2.0)
Bicarbonate: 18.7 mmol/L — ABNORMAL LOW (ref 20.0–28.0)
Bicarbonate: 16.6 mmol/L — ABNORMAL LOW (ref 20.0–28.0)
Bicarbonate: 17.9 mmol/L — ABNORMAL LOW (ref 20.0–28.0)
Bicarbonate: 14.5 mmol/L — ABNORMAL LOW (ref 20.0–28.0)
Calcium, Ion: 0.92 mmol/L — ABNORMAL LOW (ref 1.15–1.40)
Calcium, Ion: 0.88 mmol/L — CL (ref 1.15–1.40)
Calcium, Ion: 0.87 mmol/L — CL (ref 1.15–1.40)
Calcium, Ion: 0.95 mmol/L — ABNORMAL LOW (ref 1.15–1.40)
HCT: 31 % — ABNORMAL LOW (ref 36.0–46.0)
HCT: 30 % — ABNORMAL LOW (ref 36.0–46.0)
HCT: 30 % — ABNORMAL LOW (ref 36.0–46.0)
HCT: 31 % — ABNORMAL LOW (ref 36.0–46.0)
Hemoglobin: 10.5 g/dL — ABNORMAL LOW (ref 12.0–15.0)
Hemoglobin: 10.2 g/dL — ABNORMAL LOW (ref 12.0–15.0)
Hemoglobin: 10.2 g/dL — ABNORMAL LOW (ref 12.0–15.0)
Hemoglobin: 10.5 g/dL — ABNORMAL LOW (ref 12.0–15.0)
O2 Saturation: 94 %
O2 Saturation: 89 %
O2 Saturation: 100 %
O2 Saturation: 90 %
Patient temperature: 36.5
Patient temperature: 36.5
Patient temperature: 95.5
Patient temperature: 37.3
Potassium: 2.8 mmol/L — ABNORMAL LOW (ref 3.5–5.1)
Potassium: 3.3 mmol/L — ABNORMAL LOW (ref 3.5–5.1)
Potassium: 3.4 mmol/L — ABNORMAL LOW (ref 3.5–5.1)
Potassium: 3.7 mmol/L (ref 3.5–5.1)
Sodium: 122 mmol/L — ABNORMAL LOW (ref 135–145)
Sodium: 120 mmol/L — ABNORMAL LOW (ref 135–145)
Sodium: 118 mmol/L — CL (ref 135–145)
Sodium: 124 mmol/L — ABNORMAL LOW (ref 135–145)
TCO2: 20 mmol/L — ABNORMAL LOW (ref 22–32)
TCO2: 18 mmol/L — ABNORMAL LOW (ref 22–32)
TCO2: 19 mmol/L — ABNORMAL LOW (ref 22–32)
TCO2: 16 mmol/L — ABNORMAL LOW (ref 22–32)
pCO2 arterial: 37.8 mm[Hg] (ref 32–48)
pCO2 arterial: 33.6 mm[Hg] (ref 32–48)
pCO2 arterial: 38.4 mm[Hg] (ref 32–48)
pCO2 arterial: 42.2 mm[Hg] (ref 32–48)
pH, Arterial: 7.3 — ABNORMAL LOW (ref 7.35–7.45)
pH, Arterial: 7.298 — ABNORMAL LOW (ref 7.35–7.45)
pH, Arterial: 7.267 — ABNORMAL LOW (ref 7.35–7.45)
pH, Arterial: 7.147 — CL (ref 7.35–7.45)
pO2, Arterial: 76 mm[Hg] — ABNORMAL LOW (ref 83–108)
pO2, Arterial: 59 mm[Hg] — ABNORMAL LOW (ref 83–108)
pO2, Arterial: 219 mm[Hg] — ABNORMAL HIGH (ref 83–108)
pO2, Arterial: 78 mm[Hg] — ABNORMAL LOW (ref 83–108)

## 2023-07-28 LAB — CBC
HCT: 25.7 % — ABNORMAL LOW (ref 36.0–46.0)
HCT: 27.5 % — ABNORMAL LOW (ref 36.0–46.0)
Hemoglobin: 8.6 g/dL — ABNORMAL LOW (ref 12.0–15.0)
Hemoglobin: 9.2 g/dL — ABNORMAL LOW (ref 12.0–15.0)
MCH: 32.7 pg (ref 26.0–34.0)
MCH: 32.4 pg (ref 26.0–34.0)
MCHC: 33.5 g/dL (ref 30.0–36.0)
MCHC: 33.5 g/dL (ref 30.0–36.0)
MCV: 97.7 fL (ref 80.0–100.0)
MCV: 96.8 fL (ref 80.0–100.0)
Platelets: 194 10*3/uL (ref 150–400)
Platelets: 150 10*3/uL (ref 150–400)
RBC: 2.63 MIL/uL — ABNORMAL LOW (ref 3.87–5.11)
RBC: 2.84 MIL/uL — ABNORMAL LOW (ref 3.87–5.11)
RDW: 16.2 % — ABNORMAL HIGH (ref 11.5–15.5)
RDW: 15.8 % — ABNORMAL HIGH (ref 11.5–15.5)
WBC: 9.2 10*3/uL (ref 4.0–10.5)
WBC: 14.7 10*3/uL — ABNORMAL HIGH (ref 4.0–10.5)
nRBC: 0.8 % — ABNORMAL HIGH (ref 0.0–0.2)
nRBC: 0.7 % — ABNORMAL HIGH (ref 0.0–0.2)

## 2023-07-28 LAB — RENAL FUNCTION PANEL
Albumin: 2.5 g/dL — ABNORMAL LOW (ref 3.5–5.0)
Anion gap: 19 — ABNORMAL HIGH (ref 5–15)
BUN: 27 mg/dL — ABNORMAL HIGH (ref 8–23)
CO2: 14 mmol/L — ABNORMAL LOW (ref 22–32)
Calcium: 7 mg/dL — ABNORMAL LOW (ref 8.9–10.3)
Chloride: 92 mmol/L — ABNORMAL LOW (ref 98–111)
Creatinine, Ser: 4.73 mg/dL — ABNORMAL HIGH (ref 0.44–1.00)
GFR, Estimated: 10 mL/min — ABNORMAL LOW (ref >60)
Glucose, Bld: 108 mg/dL — ABNORMAL HIGH (ref 70–99)
Phosphorus: 6.9 mg/dL — ABNORMAL HIGH (ref 2.5–4.6)
Potassium: 3.3 mmol/L — ABNORMAL LOW (ref 3.5–5.1)
Sodium: 125 mmol/L — ABNORMAL LOW (ref 135–145)

## 2023-07-28 LAB — PROTIME-INR
INR: 1.3 — ABNORMAL HIGH (ref 0.8–1.2)
INR: 1.6 — ABNORMAL HIGH (ref 0.8–1.2)
Prothrombin Time: 16.5 s — ABNORMAL HIGH (ref 11.4–15.2)
Prothrombin Time: 19.4 s — ABNORMAL HIGH (ref 11.4–15.2)

## 2023-07-28 LAB — ECHOCARDIOGRAM COMPLETE
Area-P 1/2: 2.71 cm2
Calc EF: 52.6 %
Height: 63 in
MV M vel: 3.12 m/s
MV Peak grad: 38.9 mm[Hg]
MV VTI: 1.72 cm2
S' Lateral: 3.1 cm
Single Plane A2C EF: 49.2 %
Single Plane A4C EF: 53.1 %
Weight: 2141.11 [oz_av]

## 2023-07-28 LAB — AMMONIA: Ammonia: 50 umol/L — ABNORMAL HIGH (ref 9–35)

## 2023-07-28 LAB — GLUCOSE, CAPILLARY
Glucose-Capillary: 101 mg/dL — ABNORMAL HIGH (ref 70–99)
Glucose-Capillary: 115 mg/dL — ABNORMAL HIGH (ref 70–99)
Glucose-Capillary: 137 mg/dL — ABNORMAL HIGH (ref 70–99)
Glucose-Capillary: 172 mg/dL — ABNORMAL HIGH (ref 70–99)
Glucose-Capillary: 220 mg/dL — ABNORMAL HIGH (ref 70–99)
Glucose-Capillary: 48 mg/dL — ABNORMAL LOW (ref 70–99)
Glucose-Capillary: 59 mg/dL — ABNORMAL LOW (ref 70–99)
Glucose-Capillary: 59 mg/dL — ABNORMAL LOW (ref 70–99)
Glucose-Capillary: 99 mg/dL (ref 70–99)

## 2023-07-28 LAB — MRSA NEXT GEN BY PCR, NASAL: MRSA by PCR Next Gen: NOT DETECTED

## 2023-07-28 LAB — PHOSPHORUS: Phosphorus: 8.7 mg/dL — ABNORMAL HIGH (ref 2.5–4.6)

## 2023-07-28 LAB — BLOOD GAS, ARTERIAL
Acid-base deficit: 6.3 mmol/L — ABNORMAL HIGH (ref 0.0–2.0)
Bicarbonate: 21.4 mmol/L (ref 20.0–28.0)
Drawn by: 10335
O2 Saturation: 64.5 %
Patient temperature: 32.2
pCO2 arterial: 40 mm[Hg] (ref 32–48)
pH, Arterial: 7.31 — ABNORMAL LOW (ref 7.35–7.45)
pO2, Arterial: <31 mm[Hg] — CL (ref 83–108)

## 2023-07-28 LAB — HIV ANTIBODY (ROUTINE TESTING W REFLEX): HIV Screen 4th Generation wRfx: NONREACTIVE

## 2023-07-28 LAB — MAGNESIUM: Magnesium: 1.7 mg/dL (ref 1.7–2.4)

## 2023-07-28 LAB — LACTIC ACID, PLASMA: Lactic Acid, Venous: 1.6 mmol/L (ref 0.5–1.9)

## 2023-07-28 MED ORDER — LIDOCAINE HCL 1 % IJ SOLN
10.0000 mL | Freq: Once | INTRAMUSCULAR | Status: DC
  Filled 2023-07-28: qty 10

## 2023-07-28 MED ORDER — DEXTROSE 50 % IV SOLN: 12.5000 g | INTRAVENOUS | Status: AC

## 2023-07-28 MED ORDER — HYDROMORPHONE HCL 1 MG/ML IJ SOLN
0.5000 mg | INTRAMUSCULAR | Status: DC | PRN
  Administered 2023-07-29 (×2): 1 mg via INTRAVENOUS
  Administered 2023-07-29 (×3): 2 mg via INTRAVENOUS
  Administered 2023-07-30: 1 mg via INTRAVENOUS
  Filled 2023-07-28: qty 2
  Filled 2023-07-28: qty 1
  Filled 2023-07-28 (×2): qty 2
  Filled 2023-07-28: qty 1
  Filled 2023-07-28 (×2): qty 2

## 2023-07-28 MED ORDER — CALCIUM GLUCONATE-NACL 1-0.675 GM/50ML-% IV SOLN
1.0000 g | Freq: Once | INTRAVENOUS | Status: AC
  Administered 2023-07-28: 1000 mg via INTRAVENOUS
  Filled 2023-07-28: qty 50

## 2023-07-28 MED ORDER — SODIUM CHLORIDE 0.9% FLUSH: 10.0000 mL | INTRAVENOUS | Status: DC | PRN

## 2023-07-28 MED ORDER — DEXTROSE 50 % IV SOLN
INTRAVENOUS | Status: AC
  Administered 2023-07-28: 12.5 g via INTRAVENOUS
  Filled 2023-07-28: qty 50

## 2023-07-28 MED ORDER — VASOPRESSIN 20 UNITS/100 ML INFUSION FOR SHOCK
INTRAVENOUS | Status: AC
  Filled 2023-07-28: qty 100

## 2023-07-28 MED ORDER — SODIUM CHLORIDE 0.9 % FOR CRRT: INTRAVENOUS_CENTRAL | Status: DC | PRN

## 2023-07-28 MED ORDER — VANCOMYCIN VARIABLE DOSE PER UNSTABLE RENAL FUNCTION (PHARMACIST DOSING): Status: DC

## 2023-07-28 MED ORDER — SODIUM CHLORIDE 0.9% FLUSH
10.0000 mL | Freq: Two times a day (BID) | INTRAVENOUS | Status: DC
  Administered 2023-07-28: 10 mL
  Administered 2023-07-28: 30 mL
  Administered 2023-07-28 – 2023-07-29 (×2): 10 mL
  Administered 2023-07-29: 30 mL
  Administered 2023-07-30 (×2): 10 mL
  Administered 2023-07-31 – 2023-08-01 (×3): 30 mL
  Administered 2023-08-01 – 2023-08-03 (×5): 10 mL
  Administered 2023-08-04: 20 mL
  Administered 2023-08-04: 10 mL

## 2023-07-28 MED ORDER — POTASSIUM CHLORIDE 10 MEQ/100ML IV SOLN
INTRAVENOUS | Status: AC
  Administered 2023-07-28: 10 meq via INTRAVENOUS
  Filled 2023-07-28: qty 100

## 2023-07-28 MED ORDER — DEXTROSE 50 % IV SOLN
INTRAVENOUS | Status: AC
  Filled 2023-07-28: qty 50

## 2023-07-28 MED ORDER — PRISMASOL BGK 4/2.5 32-4-2.5 MEQ/L REPLACEMENT SOLN: Status: DC

## 2023-07-28 MED ORDER — DOCUSATE SODIUM 100 MG PO CAPS: 100.0000 mg | ORAL_CAPSULE | Freq: Two times a day (BID) | ORAL | Status: DC | PRN

## 2023-07-28 MED ORDER — PANTOPRAZOLE SODIUM 40 MG IV SOLR
40.0000 mg | INTRAVENOUS | Status: DC
  Administered 2023-07-28 – 2023-08-02 (×6): 40 mg via INTRAVENOUS
  Filled 2023-07-28 (×6): qty 10

## 2023-07-28 MED ORDER — RENA-VITE PO TABS
1.0000 | ORAL_TABLET | Freq: Every day | ORAL | Status: DC
  Administered 2023-07-28 – 2023-08-04 (×6): 1
  Filled 2023-07-28 (×7): qty 1

## 2023-07-28 MED ORDER — SODIUM BICARBONATE 8.4 % IV SOLN
50.0000 meq | Freq: Once | INTRAVENOUS | Status: AC
  Administered 2023-07-28: 50 meq via INTRAVENOUS
  Filled 2023-07-28: qty 50

## 2023-07-28 MED ORDER — VANCOMYCIN HCL 750 MG/150ML IV SOLN
750.0000 mg | INTRAVENOUS | Status: DC
  Administered 2023-07-28 – 2023-07-30 (×3): 750 mg via INTRAVENOUS
  Filled 2023-07-28 (×3): qty 150

## 2023-07-28 MED ORDER — ADULT MULTIVITAMIN W/MINERALS CH
1.0000 | ORAL_TABLET | Freq: Every day | ORAL | Status: DC
  Administered 2023-07-28: 1 via ORAL
  Filled 2023-07-28: qty 1

## 2023-07-28 MED ORDER — ORAL CARE MOUTH RINSE
15.0000 mL | OROMUCOSAL | Status: DC
Start: 2023-07-28 — End: 2023-07-31
  Administered 2023-07-28 – 2023-07-31 (×30): 15 mL via OROMUCOSAL

## 2023-07-28 MED ORDER — THIAMINE MONONITRATE 100 MG PO TABS
100.0000 mg | ORAL_TABLET | Freq: Every day | ORAL | Status: DC
  Administered 2023-07-29 – 2023-08-11 (×7): 100 mg via ORAL
  Filled 2023-07-28 (×9): qty 1

## 2023-07-28 MED ORDER — PIPERACILLIN-TAZOBACTAM 3.375 G IVPB
3.3750 g | Freq: Three times a day (TID) | INTRAVENOUS | Status: DC
  Administered 2023-07-28 – 2023-07-30 (×6): 3.375 g via INTRAVENOUS
  Filled 2023-07-28 (×6): qty 50

## 2023-07-28 MED ORDER — POLYETHYLENE GLYCOL 3350 17 G PO PACK
17.0000 g | PACK | Freq: Every day | ORAL | Status: DC
  Administered 2023-07-30 – 2023-08-03 (×3): 17 g
  Filled 2023-07-28 (×4): qty 1

## 2023-07-28 MED ORDER — CHLORHEXIDINE GLUCONATE CLOTH 2 % EX PADS
6.0000 | MEDICATED_PAD | Freq: Every day | CUTANEOUS | Status: DC
  Administered 2023-07-30 – 2023-08-10 (×12): 6 via TOPICAL

## 2023-07-28 MED ORDER — DEXTROSE 10 % IV SOLN: INTRAVENOUS | Status: DC

## 2023-07-28 MED ORDER — POLYETHYLENE GLYCOL 3350 17 G PO PACK
17.0000 g | PACK | Freq: Every day | ORAL | Status: DC | PRN
  Filled 2023-07-28: qty 1

## 2023-07-28 MED ORDER — LACTATED RINGERS IV BOLUS
1000.0000 mL | Freq: Once | INTRAVENOUS | Status: AC
  Administered 2023-07-28: 1000 mL via INTRAVENOUS

## 2023-07-28 MED ORDER — PIPERACILLIN-TAZOBACTAM IN DEX 2-0.25 GM/50ML IV SOLN
2.2500 g | Freq: Three times a day (TID) | INTRAVENOUS | Status: DC
  Administered 2023-07-28: 2.25 g via INTRAVENOUS
  Filled 2023-07-28 (×2): qty 50

## 2023-07-28 MED ORDER — PROSOURCE TF20 ENFIT COMPATIBL EN LIQD
60.0000 mL | Freq: Two times a day (BID) | ENTERAL | Status: DC
  Administered 2023-07-28 – 2023-07-30 (×4): 60 mL
  Filled 2023-07-28 (×4): qty 60

## 2023-07-28 MED ORDER — NOREPINEPHRINE 16 MG/250ML-% IV SOLN
0.0000 ug/min | INTRAVENOUS | Status: DC
  Administered 2023-07-28: 26 ug/min via INTRAVENOUS
  Administered 2023-07-28: 16 ug/min via INTRAVENOUS
  Administered 2023-07-29: 17 ug/min via INTRAVENOUS
  Administered 2023-07-30: 10 ug/min via INTRAVENOUS
  Filled 2023-07-28 (×5): qty 250

## 2023-07-28 MED ORDER — KETAMINE HCL 50 MG/5ML IJ SOSY
PREFILLED_SYRINGE | INTRAMUSCULAR | Status: AC
  Administered 2023-07-28: 50 mg
  Filled 2023-07-28: qty 10

## 2023-07-28 MED ORDER — LORAZEPAM 2 MG/ML IJ SOLN: 0.0000 mg | Freq: Two times a day (BID) | INTRAMUSCULAR | Status: DC

## 2023-07-28 MED ORDER — LIDOCAINE HCL (PF) 1 % IJ SOLN
INTRAMUSCULAR | Status: AC
  Filled 2023-07-28: qty 30

## 2023-07-28 MED ORDER — HEPARIN SODIUM (PORCINE) 5000 UNIT/ML IJ SOLN
5000.0000 [IU] | Freq: Three times a day (TID) | INTRAMUSCULAR | Status: DC
  Administered 2023-07-28 (×2): 5000 [IU] via SUBCUTANEOUS
  Filled 2023-07-28 (×2): qty 1

## 2023-07-28 MED ORDER — HEPARIN SODIUM (PORCINE) 1000 UNIT/ML DIALYSIS
1000.0000 [IU] | INTRAMUSCULAR | Status: DC | PRN
  Administered 2023-07-31: 2800 [IU] via INTRAVENOUS_CENTRAL
  Filled 2023-07-28: qty 3
  Filled 2023-07-28: qty 6

## 2023-07-28 MED ORDER — STERILE WATER FOR INJECTION IV SOLN
INTRAVENOUS | Status: DC
  Filled 2023-07-28 (×3): qty 150

## 2023-07-28 MED ORDER — ROCURONIUM BROMIDE 10 MG/ML (PF) SYRINGE
PREFILLED_SYRINGE | INTRAVENOUS | Status: AC
  Administered 2023-07-28: 100 mg
  Filled 2023-07-28: qty 10

## 2023-07-28 MED ORDER — PRISMASOL BGK 4/2.5 32-4-2.5 MEQ/L EC SOLN: Status: DC

## 2023-07-28 MED ORDER — KETAMINE HCL 50 MG/5ML IJ SOSY
100.0000 mg | PREFILLED_SYRINGE | Freq: Once | INTRAMUSCULAR | Status: AC
  Administered 2023-07-28: 100 mg via INTRAVENOUS

## 2023-07-28 MED ORDER — ORAL CARE MOUTH RINSE: 15.0000 mL | OROMUCOSAL | Status: DC | PRN

## 2023-07-28 MED ORDER — POTASSIUM CHLORIDE 10 MEQ/50ML IV SOLN
10.0000 meq | INTRAVENOUS | Status: AC
  Administered 2023-07-28 (×4): 10 meq via INTRAVENOUS
  Filled 2023-07-28 (×4): qty 50

## 2023-07-28 MED ORDER — CHLORHEXIDINE GLUCONATE CLOTH 2 % EX PADS
6.0000 | MEDICATED_PAD | Freq: Every day | CUTANEOUS | Status: DC
  Administered 2023-07-28 – 2023-07-30 (×3): 6 via TOPICAL

## 2023-07-28 MED ORDER — PHENYLEPHRINE 80 MCG/ML (10ML) SYRINGE FOR IV PUSH (FOR BLOOD PRESSURE SUPPORT)
PREFILLED_SYRINGE | INTRAVENOUS | Status: AC
  Filled 2023-07-28: qty 10

## 2023-07-28 MED ORDER — VITAL 1.5 CAL PO LIQD
1000.0000 mL | ORAL | Status: DC
  Administered 2023-07-28 – 2023-07-30 (×3): 1000 mL
  Filled 2023-07-28: qty 1000

## 2023-07-28 MED ORDER — HYDROCORTISONE SOD SUC (PF) 100 MG IJ SOLR
100.0000 mg | Freq: Two times a day (BID) | INTRAMUSCULAR | Status: DC
  Administered 2023-07-28 – 2023-07-29 (×4): 100 mg via INTRAVENOUS
  Filled 2023-07-28 (×5): qty 2

## 2023-07-28 MED ORDER — PIVOT 1.5 CAL PO LIQD: 1000.0000 mL | ORAL | Status: DC

## 2023-07-28 MED ORDER — FOLIC ACID 1 MG PO TABS
1.0000 mg | ORAL_TABLET | Freq: Every day | ORAL | Status: DC
  Administered 2023-07-28: 1 mg via ORAL
  Filled 2023-07-28: qty 1

## 2023-07-28 MED ORDER — THIAMINE HCL 100 MG/ML IJ SOLN
100.0000 mg | Freq: Every day | INTRAMUSCULAR | Status: DC
  Administered 2023-07-28 – 2023-08-09 (×8): 100 mg via INTRAVENOUS
  Filled 2023-07-28 (×9): qty 2

## 2023-07-28 MED ORDER — ETOMIDATE 2 MG/ML IV SOLN: 20.0000 mg | Freq: Once | INTRAVENOUS | Status: DC

## 2023-07-28 MED ORDER — PROSOURCE TF20 ENFIT COMPATIBL EN LIQD: 60.0000 mL | Freq: Every day | ENTERAL | Status: DC

## 2023-07-28 MED ORDER — ORAL CARE MOUTH RINSE
15.0000 mL | OROMUCOSAL | Status: DC
  Administered 2023-07-28 (×3): 15 mL via OROMUCOSAL

## 2023-07-28 MED ORDER — DOCUSATE SODIUM 50 MG/5ML PO LIQD
100.0000 mg | Freq: Two times a day (BID) | ORAL | Status: DC
  Administered 2023-07-28 – 2023-07-30 (×3): 100 mg
  Filled 2023-07-28 (×3): qty 10

## 2023-07-28 MED ORDER — HYDROMORPHONE HCL 1 MG/ML IJ SOLN
0.5000 mg | INTRAMUSCULAR | Status: AC | PRN
  Administered 2023-07-28 – 2023-07-29 (×3): 0.5 mg via INTRAVENOUS
  Filled 2023-07-28 (×3): qty 0.5

## 2023-07-28 MED ORDER — LORAZEPAM 2 MG/ML IJ SOLN
0.0000 mg | Freq: Four times a day (QID) | INTRAMUSCULAR | Status: DC
  Administered 2023-07-28: 2 mg via INTRAVENOUS
  Filled 2023-07-28: qty 1

## 2023-07-28 MED ORDER — FOLIC ACID 1 MG PO TABS
1.0000 mg | ORAL_TABLET | Freq: Every day | ORAL | Status: DC
  Administered 2023-07-29 – 2023-08-05 (×6): 1 mg
  Filled 2023-07-28 (×6): qty 1

## 2023-07-28 MED ORDER — VASOPRESSIN 20 UNITS/100 ML INFUSION FOR SHOCK
0.0000 [IU]/min | INTRAVENOUS | Status: DC
  Administered 2023-07-28 – 2023-07-30 (×7): 0.03 [IU]/min via INTRAVENOUS
  Filled 2023-07-28 (×6): qty 100

## 2023-07-28 NOTE — Progress Notes (Signed)
Pharmacy Antibiotic Note  Carol Rowe is a 67 y.o. female admitted on 07/27/2023 with  ARF, sepsis, shock   Pharmacy has been consulted for vancomycin and pip-tazo dosing. Patient was started on CRRT 10/19. Antibiotic dosing to be adjusted. No plans to reload vancomycin at this time and will start maintenance dose at 10-15 mg/kg every 24 hours.  Plan: Zosyn 3.375 IV q8h Vancomycin 750 mg q24h (maintenance dose)  Monitor WBC and fever curve Follow-up culture and susceptibility results  Height: 5\' 3"  (160 cm) Weight: 60.7 kg (133 lb 13.1 oz) IBW/kg (Calculated) : 52.4  Temp (24hrs), Avg:94.7 F (34.8 C), Min:86.7 F (30.4 C), Max:99.5 F (37.5 C)  Recent Labs  Lab 07/27/23 2201 07/27/23 2243 07/28/23 0005 07/28/23 0234  WBC 15.2*  --   --  9.2  CREATININE 6.84*  --  6.23* 6.45*  LATICACIDVEN  --  4.3*  --  1.6    Estimated Creatinine Clearance: 7.2 mL/min (A) (by C-G formula based on SCr of 6.45 mg/dL (H)).    Allergies  Allergen Reactions   Macrobid [Nitrofurantoin Macrocrystal] Hives   Mirtazapine Swelling    Wilmer Floor, PharmD PGY2 Cardiology Pharmacy Resident  Please check AMION for all [facility] Pharmacy numbers 07/28/2023

## 2023-07-28 NOTE — Procedures (Signed)
Central Venous Catheter Insertion Procedure Note  PAZ PEDROZA  [id number]  11-29-1955  Date:07/28/23  Time:6:57 AM   Provider Performing:Jean-Pierre Delon Revelo   Procedure: Insertion of Non-tunneled Central Venous Catheter(36556)with US guidance (40981)    Indication(s) Hemodialysis  Consent Risks of the procedure as well as the alternatives and risks of each were explained to the patient and/or caregiver.  Consent for the procedure was obtained and is signed in the bedside chart  Anesthesia Topical only with 1% lidocaine   Timeout Verified patient identification, verified procedure, site/side was marked, verified correct patient position, special equipment/implants available, medications/allergies/relevant history reviewed, required imaging and test results available.  Sterile Technique Maximal sterile technique including full sterile barrier drape, hand hygiene, sterile gown, sterile gloves, mask, hair covering, sterile ultrasound probe cover (if used).  Procedure Description Area of catheter insertion was cleaned with chlorhexidine and draped in sterile fashion.   With real-time ultrasound guidance a HD catheter was placed into the right femoral vein.  Nonpulsatile blood flow and easy flushing noted in all ports.  The catheter was sutured in place and sterile dressing applied.  Complications/Tolerance None; patient tolerated the procedure well. Chest X-ray is ordered to verify placement for internal jugular or subclavian cannulation.  Chest x-ray is not ordered for femoral cannulation.  EBL Minimal  Specimen(s) None  Janann Colonel, MD [place] [facility] 07/28/2023 6:58 AM

## 2023-07-28 NOTE — H&P (Signed)
NAME:  Carol Rowe, MRN:  [id number], DOB:  12/26/1955, LOS: 1 ADMISSION DATE:  07/27/2023 CHIEF COMPLAINT:  Shock   History of Present Illness:  This is a case of a 67 year old female patient with a past medical history of alcohol use disorder, alcoholic liver cirrhosis, essential hypertension and presumed COPD presenting to [facility] as a transfer from [facility] for shock.  History was mainly taken from the patient at bedside and chart dissection.  She initially presented to [facility] on 10/18 after a family member went over to her house and found her down.  She apparently was walking to the bathroom and fell down and struck her head.  EMS were called by family members and on arrival she was found down hypoglycemic at the 30s.  She was given IV dextrose and transferred to [facility].  Last known normal 6 days ago.  In the ED she was found to be hypotensive, altered with left-sided focal deficits.  Therefore a code stroke was called and a stat CT head was obtained which was nonrevealing.  Neurology was consulted and felt less likely to be stroke as no focal weakness was present on their exam.  Felt this was secondary to metabolic encephalopathy.  Furthermore, labs were initially concerning for leukocytosis with a WBC count of 15.2.  Hyponatremia with a sodium of 122 and severe hypokalemia with a potassium of 2.0.  Also noted to have severe AKI with a creatinine of 6.84 no previous labs on file to determine baseline. Lactic acidosis also noted at 4.3. She was given 3 L normal saline and started on norepinephrine.  Blood culture was drawn and given broad-spectrum antibiotics.  Also central line was placed at [facility].  Chest x-ray with no focal consolidation.  Transferred here for further management.  On encounter. Patient A&O x3 to self time and place. 1L LR given and again noted to be hypoglycemic requiring another D50 push. She was on 12 mcg/min NE. A-line placed. Continue  on broad spec abx pending culture data. Remained Anuric, CT A/P pending.  Bedside echocardiogram with normal EF. RV not enlarge and no pericardial effusion. Repeat labs with normalized lactic acid.   Social History:   reports that she has been smoking cigarettes. She has never used smokeless tobacco. She reports current alcohol use. She reports that she does not use drugs.   Family History:  Her family history includes Breast cancer in her mother; Colon cancer in her father; Diabetes in her paternal grandmother; Heart disease in her father and maternal grandmother.   Review of system -unable to obtain given mental status  Pertinent  Medical History  Shock likely distributive as below  Alcoholic liver cirrhosis.  Essential HTN.  Alcohol use disorder   Objective   Blood pressure (!) 92/56, pulse 77, temperature (!) 95.4 F (35.2 C), resp. rate (!) 22, height 5\' 3"  (1.6 m), weight 55.8 kg, SpO2 93%.      Intake/Output Summary (Last 24 hours) at 07/28/2023 0420 Last data filed at 07/28/2023 0107 Gross per 24 hour  Intake 2077.11 ml  Output --  Net 2077.11 ml   Filed Weights   07/27/23 2117  Weight: 55.8 kg    Examination: General: NAD, A&O x3  HENT: Supple neck, reactive pupils  Lungs: Clear bilateral air entry  Cardiovascular: Normal S1, Normal S2, RRR, no murmurs appreciated  Abdomen: Distended, mildly tender  Extremities: Warm and well perfused, trace edema  Labs and imaging reviewed/  Assessment & Plan:  This is a case of a 67 year old female patient with a past medical history of alcohol use disorder, alcoholic liver cirrhosis, essential hypertension and presumed COPD presenting to [facility] as a transfer from [facility] for shock.  #Shock likely distributive in the setting of sepsis unclear source yet (Low suspicion for obtructive shock given normal bedside echo, normal rate and normal respiratory status) c/b... #Metabolic encephalopathy  (improving) #Severe oliguric AKI with a creatinine of 6.7mg /dl likley pre renal azotemia/ ATN due to sepsis and dehydration. No acute indication for dialysis.  #Hypokalemia - Hypochloremia and hyponatremia in the setting of dehydration and malnutrition in the setting of  #Alcohol use disorder and alcoholic liver cirrhosis #Hypoglycemia possibly in the setting of sepsis  #Lactic acidosis now resolved.    Plan  CNS: CIWA protocol. Thiamine - multivitamin and folate. Neurochecks Q4H and should focal neurodecitis recur --> MRI brain.  CVS/ID: Norepinephrine to target MAPs > 65. Pending Blood culture and urine culture. Continue broad spectrum abx until further micro data. Echocardiogram in the am.  Lungs: Goal O2 89-92%.  GI: NPO for now. Can start diet in the am.  Renal: Urine sodium, Urine Cr, Urine urea. Serum Osm. BMP Q4H. 1L LR. CT a/p wo contrast.  Heme: Heparin [place] for DVT prophylaxis.  Endo: Start D10 for goal glucose > 100   Best Practice (right click and "Reselect all SmartList Selections" daily)   Diet/type: NPO w/ oral meds DVT prophylaxis: prophylactic heparin  GI prophylaxis: N/A Lines: Central line Foley:  Yes, and it is still needed Code Status:  full code   Critical care time: I spent 74 minutes caring for this patient today, including preparing to see the patient, obtaining a medical history , reviewing a separately obtained history, performing a medically appropriate examination and/or evaluation, counseling and educating the patient/family/caregiver, ordering medications, tests, or procedures, documenting clinical information in the electronic health record, and independently interpreting results (not separately reported/billed) and communicating results to the patient/family/caregiver  Janann Colonel, MD [place] [facility] 07/28/2023 4:45 AM

## 2023-07-28 NOTE — Consult Note (Signed)
[facility] Renal Consultation Note  Requesting MD: Chestine Spore Indication for Consultation: AKI  HPI:  Carol Rowe is a 67 y.o. female with cirrhosis and continued ETOH use, COPD, HTN- reportedly on ace and demedex as OP but unclear how compliant she is.  There are no labs past 2018 that I can find so unknown what recent kidney function baseline is.  She was brought to the ER found down-   last known normal 6 days ago-  found to be hypotensive and hypoglycemic-  other labs remarkable for crt of 6.8, k of 2.0, hgb of 8.6-  she was admitted with diagnosis of sepsis-  overnight patient has worsened-  continuing to by hypotensive req pressors-  more acidotic and requiring intubation, hypothermic and basically no UOP-  I am asked to provide CRRT for clearance and treatment of acidosis and hyponatremia.  Pt looks much older than her true age- unable to obtain history  Creat  Date/Time Value Ref Range Status  08/30/2012 07:41 AM 0.78 0.50 - 1.10 mg/dL Final   Creatinine, Ser  Date/Time Value Ref Range Status  07/28/2023 02:34 AM 6.45 (H) 0.44 - 1.00 mg/dL Final  08/05/2535 64:40 AM 6.23 (H) 0.44 - 1.00 mg/dL Final  34/74/2595 63:87 PM 6.84 (H) 0.44 - 1.00 mg/dL Final  56/43/3295 18:84 AM 0.86 0.44 - 1.00 mg/dL Final  16/60/6301 60:10 AM 0.85 0.50 - 1.10 mg/dL Final  93/23/5573 22:02 AM 0.77 0.4 - 1.2 mg/dL Final     PMHx:   Past Medical History:  Diagnosis Date   Alcoholic (HCC)    Arthritis    Hypertension    VIN III (vulvar intraepithelial neoplasia III)    Vulvar lesion     Past Surgical History:  Procedure Laterality Date   ABDOMINAL HYSTERECTOMY  1989   CESAREAN SECTION     CHOLECYSTECTOMY     COLONOSCOPY N/A 12/10/2019   Procedure: COLONOSCOPY;  Surgeon: [name], MD;  [facility]: [facility];  Service: Endoscopy;  Laterality: N/A;  730   POLYPECTOMY  12/10/2019   Procedure: POLYPECTOMY;  Surgeon: [name], MD;  Location: [facility];  Service:  Endoscopy;;   skin cancer removal      Family Hx:  Family History  Problem Relation Age of Onset   Colon cancer Father    Heart disease Father    Breast cancer Mother    Heart disease Maternal Grandmother    Diabetes Paternal Grandmother     Social History:  reports that she has been smoking cigarettes. She has never used smokeless tobacco. She reports current alcohol use. She reports that she does not use drugs.  Allergies:  Allergies  Allergen Reactions   Macrobid [Nitrofurantoin Macrocrystal] Hives   Mirtazapine Swelling    Medications: Prior to Admission medications   Medication Sig Start Date End Date Taking? Authorizing Provider  acetaminophen (TYLENOL) 500 MG tablet Take 1,000 mg by mouth every 6 (six) hours as needed for moderate pain.    [provider]  albuterol (VENTOLIN HFA) 108 (90 Base) MCG/ACT inhaler Inhale 2 puffs into the lungs 2 (two) times daily as needed for shortness of breath. 11/11/19   [provider]  ALPRAZolam Prudy Feeler) 0.5 MG tablet Take 0.5 mg by mouth 3 (three) times daily as needed for anxiety.     [provider]  ascorbic acid (VITAMIN C) 500 MG tablet Take 500 mg by mouth every other day.    [provider]  aspirin EC  325 MG tablet Take 325 mg by mouth daily as needed (headaches).    [provider]  B Complex Vitamins (B COMPLEX PO) Take 1 Dose by mouth every other day. Liquid b complex    [provider]  cetirizine (ZYRTEC) 10 MG tablet Take 10 mg by mouth daily as needed for allergies.    [provider]  Cholecalciferol (DIALYVITE VITAMIN D 5000) 125 MCG (5000 UT) capsule Take 5,000 Units by mouth every other day.    [provider]  estradiol (VIVELLE-DOT) 0.1 MG/24HR Place 1 patch onto the skin 2 (two) times a week.    [provider]  fluticasone (FLONASE) 50 MCG/ACT nasal spray Place 2 sprays into both nostrils daily.    [provider]  gabapentin  (NEURONTIN) 300 MG capsule Take 300 mg by mouth at bedtime.    [provider]  Glycerin-Polysorbate 80 (REFRESH DRY EYE THERAPY OP) Place 1 drop into both eyes daily as needed (dry eyes).    [provider]  levocetirizine (XYZAL) 5 MG tablet Take 5 mg by mouth at bedtime.    [provider]  Magnesium 400 MG CAPS Take 400 mg by mouth every other day.    [provider]  Menthol-Methyl Salicylate (SALONPAS PAIN RELIEF PATCH EX) Apply 1-3 patches topically daily as needed (pain).    [provider]  OVER THE COUNTER MEDICATION Take 6 tablets by mouth daily as needed (constipation). Tam herbal laxative    [provider]  pantoprazole (PROTONIX) 40 MG tablet Take 40 mg by mouth See admin instructions. Take 40 mg daily may take a second 40 mg dose as needed for heartburn    [provider]  potassium chloride (KLOR-CON) 10 MEQ tablet Take 10 mEq by mouth daily.    [provider]  Probiotic CAPS Take 1 capsule by mouth every other day.    [provider]  ramipril (ALTACE) 2.5 MG capsule Take 2.5 mg by mouth daily.    [provider]  torsemide (DEMADEX) 20 MG tablet Take 20-40 mg by mouth See admin instructions. Take 20 or 40 mg once daily in the morning    [provider]  trolamine salicylate (ASPERCREME) 10 % cream Apply 1 application topically as needed for muscle pain.    [provider]  Turmeric Curcumin 500 MG CAPS Take 500 mg by mouth every other day.    [provider]  valACYclovir (VALTREX) 1000 MG tablet Take 500-1,000 mg by mouth See admin instructions. Take 500 mg daily, may take 1000 mg instead if currently breaking out    [provider]  vitamin E 180 MG (400 UNITS) capsule Take 400 Units by mouth every other day.    [provider]  Wheat Dextrin (BENEFIBER DRINK MIX) PACK Take 4 g by mouth at bedtime. 12/10/19   [name], MD  Zinc 50 MG CAPS  Take 50 mg by mouth every other day.    [provider]    I have reviewed the patient's current medications.  Labs:  Results for orders placed or performed during the hospital encounter of 07/27/23 (from the past 48 hour(s))  CBG monitoring, ED     Status: Abnormal   Collection Time: 07/27/23  9:14 PM  Result Value Ref Range   Glucose-Capillary 109 (H) 70 - 99 mg/dL    Comment: Glucose reference range applies only to samples taken after fasting for at least 8 hours.  Ethanol  Status: Abnormal   Collection Time: 07/27/23 10:01 PM  Result Value Ref Range   Alcohol, Ethyl (B) 10 (H) <10 mg/dL    Comment: (NOTE) Lowest detectable limit for serum alcohol is 10 mg/dL.  For medical purposes only. Performed at [facility], [address] [place], [place] [address]   Protime-INR     Status: Abnormal   Collection Time: 07/27/23 10:01 PM  Result Value Ref Range   Prothrombin Time 15.5 (H) 11.4 - 15.2 seconds   INR 1.2 0.8 - 1.2    Comment: (NOTE) INR goal varies based on device and disease states. Performed at [facility], [address] [place], [place] [address]   APTT     Status: Abnormal   Collection Time: 07/27/23 10:01 PM  Result Value Ref Range   aPTT 39 (H) 24 - 36 seconds    Comment:        IF BASELINE aPTT IS ELEVATED, SUGGEST PATIENT RISK ASSESSMENT BE USED TO DETERMINE APPROPRIATE ANTICOAGULANT THERAPY. Performed at [facility], [address], [place], [place] [address]   CBC     Status: Abnormal   Collection Time: 07/27/23 10:01 PM  Result Value Ref Range   WBC 15.2 (H) 4.0 - 10.5 K/uL   RBC 2.65 (L) 3.87 - 5.11 MIL/uL   Hemoglobin 8.8 (L) 12.0 - 15.0 g/dL   HCT 62.9 (L) 52.8 - 41.3 %   MCV 103.0 (H) 80.0 - 100.0 fL   MCH 33.2 26.0 - 34.0 pg   MCHC 32.2 30.0 - 36.0 g/dL   RDW 24.4 (H) 01.0 - 27.2 %   Platelets 227 150 - 400 K/uL   nRBC 0.7 (H) 0.0 - 0.2 %    Comment: Performed at [facility], [address] [place], [place] [address]   Differential     Status: Abnormal   Collection Time: 07/27/23 10:01 PM  Result Value Ref Range   Neutrophils Relative % 95 %   Neutro Abs 14.3 (H) 1.7 - 7.7 K/uL   Lymphocytes Relative 3 %   Lymphs Abs 0.5 (L) 0.7 - 4.0 K/uL   Monocytes Relative 2 %   Monocytes Absolute 0.3 0.1 - 1.0 K/uL   Eosinophils Relative 0 %   Eosinophils Absolute 0.0 0.0 - 0.5 K/uL   Basophils Relative 0 %   Basophils Absolute 0.0 0.0 - 0.1 K/uL   Immature Granulocytes 0 %   Abs Immature Granulocytes 0.05 0.00 - 0.07 K/uL    Comment: Performed at [facility], [address], [place], [place] [address]  Comprehensive metabolic panel     Status: Abnormal   Collection Time: 07/27/23 10:01 PM  Result Value Ref Range   Sodium 122 (L) 135 - 145 mmol/L   Potassium 2.0 (LL) 3.5 - 5.1 mmol/L    Comment: CRITICAL RESULT CALLED TO, READ BACK BY AND VERIFIED WITH CLINTON,A ON 07/27/23 AT 2310 BY LOY,C   Chloride 80 (L) 98 - 111 mmol/L   CO2 14 (L) 22 - 32 mmol/L   Glucose, Bld 84 70 - 99 mg/dL    Comment: Glucose reference range applies only to samples taken after fasting for at least 8 hours.   BUN 37 (H) 8 - 23 mg/dL   Creatinine, Ser 4.25 (H) 0.44 - 1.00 mg/dL   Calcium 7.0 (L) 8.9 - 10.3 mg/dL   Total Protein 6.3 (L) 6.5 - 8.1 g/dL   Albumin 3.2 (L) 3.5 - 5.0 g/dL   AST 67 (H) 15 - 41 U/L  ALT 32 0 - 44 U/L   Alkaline Phosphatase 208 (H) 38 - 126 U/L   Total Bilirubin 1.0 0.3 - 1.2 mg/dL   GFR, Estimated 6 (L) >60 mL/min    Comment: (NOTE) Calculated using the CKD-EPI Creatinine Equation (2021)    Anion gap 28 (H) 5 - 15    Comment: Electrolytes repeated to confirm. Electrolytes repeated to confirm. Performed at [facility], [address], [place], [place] [address]   CK     Status: Abnormal   Collection Time: 07/27/23 10:01 PM  Result Value Ref Range   Total CK 519 (H) 38 - 234 U/L    Comment: Performed at [facility], [address], [place], [place] [address]  Blood culture (routine x 2)      Status: None (Preliminary result)   Collection Time: 07/27/23 10:38 PM   Specimen: BLOOD RIGHT HAND  Result Value Ref Range   Specimen Description BLOOD RIGHT HAND BLOOD    Special Requests      BOTTLES DRAWN AEROBIC AND ANAEROBIC Blood Culture adequate volume   Culture      NO GROWTH < 12 HOURS Performed at [facility], [address] [place], [place] [address]    Report Status PENDING   Lactic acid, plasma     Status: Abnormal   Collection Time: 07/27/23 10:43 PM  Result Value Ref Range   Lactic Acid, Venous 4.3 (HH) 0.5 - 1.9 mmol/L    Comment: CRITICAL RESULT CALLED TO, READ BACK BY AND VERIFIED WITH ABBY CHESTER 2330 07/27/23 NN Performed at [facility], [address], [place], [place] [address]   Blood culture (routine x 2)     Status: None (Preliminary result)   Collection Time: 07/27/23 10:49 PM   Specimen: Left Antecubital; Blood  Result Value Ref Range   Specimen Description LEFT ANTECUBITAL BLOOD    Special Requests      BOTTLES DRAWN AEROBIC AND ANAEROBIC Blood Culture results may not be optimal due to an excessive volume of blood received in culture bottles   Culture      NO GROWTH < 12 HOURS Performed at [facility], [address], [place], [place] [address]    Report Status PENDING   Basic metabolic panel     Status: Abnormal   Collection Time: 07/28/23 12:05 AM  Result Value Ref Range   Sodium 124 (L) 135 - 145 mmol/L   Potassium 2.2 (LL) 3.5 - 5.1 mmol/L    Comment: CRITICAL RESULT CALLED TO, READ BACK BY AND VERIFIED WITH TURNER,C @ 0050 ON 07/28/23 BY JUW   Chloride 83 (L) 98 - 111 mmol/L   CO2 19 (L) 22 - 32 mmol/L   Glucose, Bld 61 (L) 70 - 99 mg/dL    Comment: Glucose reference range applies only to samples taken after fasting for at least 8 hours.   BUN 35 (H) 8 - 23 mg/dL   Creatinine, Ser 4.03 (H) 0.44 - 1.00 mg/dL   Calcium 6.9 (L) 8.9 - 10.3 mg/dL   GFR, Estimated 7 (L) >60 mL/min    Comment: (NOTE) Calculated using the CKD-EPI Creatinine  Equation (2021)    Anion gap 22 (H) 5 - 15    Comment: Performed at [facility], [address], [place], [place] [address]  Blood gas, arterial (at [facility])     Status: Abnormal   Collection Time: 07/28/23 12:19 AM  Result Value Ref Range   pH, Arterial 7.31 (L) 7.35 - 7.45  pCO2 arterial 40 32 - 48 mmHg   pO2, Arterial <31 (LL) 83 - 108 mmHg    Comment: CRITICAL RESULT CALLED TO, READ BACK BY AND VERIFIED WITH: PRUITT,G @ 0036 ON 07/28/23 BY JUW    Bicarbonate 21.4 20.0 - 28.0 mmol/L   Acid-base deficit 6.3 (H) 0.0 - 2.0 mmol/L   O2 Saturation 64.5 %   Patient temperature 32.2    Collection site REVIEWED BY    Drawn by [id number]    Allens test (pass/fail) NOT INDICATED (A) PASS    Comment: Performed at [facility], [address], [place], [place] [address]  MRSA Next Gen by PCR, Nasal     Status: None   Collection Time: 07/28/23  2:16 AM   Specimen: Nasal Mucosa; Nasal Swab  Result Value Ref Range   MRSA by PCR Next Gen NOT DETECTED NOT DETECTED    Comment: (NOTE) The GeneXpert MRSA Assay (FDA approved for NASAL specimens only), is one component of a comprehensive MRSA colonization surveillance program. It is not intended to diagnose MRSA infection nor to guide or monitor treatment for MRSA infections. Test performance is not FDA approved in patients less than 42 years old. Performed at [facility], [address], [place], [place] [address]   Glucose, capillary     Status: Abnormal   Collection Time: 07/28/23  2:18 AM  Result Value Ref Range   Glucose-Capillary 48 (L) 70 - 99 mg/dL    Comment: Glucose reference range applies only to samples taken after fasting for at least 8 hours.  Lactic acid, plasma     Status: None   Collection Time: 07/28/23  2:34 AM  Result Value Ref Range   Lactic Acid, Venous 1.6 0.5 - 1.9 mmol/L    Comment: Performed at [facility], [address], [place], [place] [address]  Basic metabolic panel     Status: Abnormal   Collection  Time: 07/28/23  2:34 AM  Result Value Ref Range   Sodium 121 (L) 135 - 145 mmol/L   Potassium 2.2 (LL) 3.5 - 5.1 mmol/L    Comment: CRITICAL RESULT CALLED TO, READ BACK BY AND VERIFIED WITH Heber Littlefork RN [phone] M.ALAMANO   Chloride 80 (L) 98 - 111 mmol/L   CO2 18 (L) 22 - 32 mmol/L   Glucose, Bld 200 (H) 70 - 99 mg/dL    Comment: Glucose reference range applies only to samples taken after fasting for at least 8 hours.   BUN 37 (H) 8 - 23 mg/dL   Creatinine, Ser 4.40 (H) 0.44 - 1.00 mg/dL   Calcium 6.8 (L) 8.9 - 10.3 mg/dL   GFR, Estimated 7 (L) >60 mL/min    Comment: (NOTE) Calculated using the CKD-EPI Creatinine Equation (2021)    Anion gap 23 (H) 5 - 15    Comment: ELECTROLYTES REPEATED TO VERIFY Performed at [facility], [address], [place], [place] [address]   CBC     Status: Abnormal   Collection Time: 07/28/23  2:34 AM  Result Value Ref Range   WBC 9.2 4.0 - 10.5 K/uL   RBC 2.63 (L) 3.87 - 5.11 MIL/uL   Hemoglobin 8.6 (L) 12.0 - 15.0 g/dL   HCT 59.5 (L) 63.8 - 75.6 %   MCV 97.7 80.0 - 100.0 fL   MCH 32.7 26.0 - 34.0 pg   MCHC 33.5 30.0 - 36.0 g/dL   RDW 43.3 (H) 29.5 - 18.8 %   Platelets  194 150 - 400 K/uL   nRBC 0.8 (H) 0.0 - 0.2 %    Comment: Performed at [facility], [address] [place], [place] [address]  Phosphorus     Status: Abnormal   Collection Time: 07/28/23  2:34 AM  Result Value Ref Range   Phosphorus 8.7 (H) 2.5 - 4.6 mg/dL    Comment: Performed at [facility], [address] [place], [place] [address]  Magnesium     Status: None   Collection Time: 07/28/23  2:34 AM  Result Value Ref Range   Magnesium 1.7 1.7 - 2.4 mg/dL    Comment: Performed at [facility], [address], [place], [place] [address]  Protime-INR     Status: Abnormal   Collection Time: 07/28/23  2:34 AM  Result Value Ref Range   Prothrombin Time 16.5 (H) 11.4 - 15.2 seconds   INR 1.3 (H) 0.8 - 1.2    Comment: (NOTE) INR goal varies based  on device and disease states. Performed at [facility], [address] [place], [place] [address]   Glucose, capillary     Status: Abnormal   Collection Time: 07/28/23  2:41 AM  Result Value Ref Range   Glucose-Capillary 220 (H) 70 - 99 mg/dL    Comment: Glucose reference range applies only to samples taken after fasting for at least 8 hours.  I-STAT 7, (LYTES, BLD GAS, ICA, H+H)     Status: Abnormal   Collection Time: 07/28/23  3:45 AM  Result Value Ref Range   pH, Arterial 7.300 (L) 7.35 - 7.45   pCO2 arterial 37.8 32 - 48 mmHg   pO2, Arterial 76 (L) 83 - 108 mmHg   Bicarbonate 18.7 (L) 20.0 - 28.0 mmol/L   TCO2 20 (L) 22 - 32 mmol/L   O2 Saturation 94 %   Acid-base deficit 7.0 (H) 0.0 - 2.0 mmol/L   Sodium 122 (L) 135 - 145 mmol/L   Potassium 2.8 (L) 3.5 - 5.1 mmol/L   Calcium, Ion 0.92 (L) 1.15 - 1.40 mmol/L   HCT 31.0 (L) 36.0 - 46.0 %   Hemoglobin 10.5 (L) 12.0 - 15.0 g/dL   Patient temperature 29.5 C    Sample type ARTERIAL   Glucose, capillary     Status: Abnormal   Collection Time: 07/28/23  3:58 AM  Result Value Ref Range   Glucose-Capillary 59 (L) 70 - 99 mg/dL    Comment: Glucose reference range applies only to samples taken after fasting for at least 8 hours.  Glucose, capillary     Status: Abnormal   Collection Time: 07/28/23  4:04 AM  Result Value Ref Range   Glucose-Capillary 59 (L) 70 - 99 mg/dL    Comment: Glucose reference range applies only to samples taken after fasting for at least 8 hours.  Glucose, capillary     Status: Abnormal   Collection Time: 07/28/23  5:27 AM  Result Value Ref Range   Glucose-Capillary 137 (H) 70 - 99 mg/dL    Comment: Glucose reference range applies only to samples taken after fasting for at least 8 hours.  I-STAT 7, (LYTES, BLD GAS, ICA, H+H)     Status: Abnormal   Collection Time: 07/28/23  5:46 AM  Result Value Ref Range   pH, Arterial 7.298 (L) 7.35 - 7.45   pCO2 arterial 33.6 32 - 48 mmHg   pO2, Arterial 59 (L)  83 - 108 mmHg   Bicarbonate 16.6 (  L) 20.0 - 28.0 mmol/L   TCO2 18 (L) 22 - 32 mmol/L   O2 Saturation 89 %   Acid-base deficit 9.0 (H) 0.0 - 2.0 mmol/L   Sodium 120 (L) 135 - 145 mmol/L   Potassium 3.3 (L) 3.5 - 5.1 mmol/L   Calcium, Ion 0.88 (LL) 1.15 - 1.40 mmol/L   HCT 30.0 (L) 36.0 - 46.0 %   Hemoglobin 10.2 (L) 12.0 - 15.0 g/dL   Patient temperature 16.1 C    Sample type ARTERIAL   Glucose, capillary     Status: Abnormal   Collection Time: 07/28/23  8:15 AM  Result Value Ref Range   Glucose-Capillary 172 (H) 70 - 99 mg/dL    Comment: Glucose reference range applies only to samples taken after fasting for at least 8 hours.  I-STAT 7, (LYTES, BLD GAS, ICA, H+H)     Status: Abnormal   Collection Time: 07/28/23  8:16 AM  Result Value Ref Range   pH, Arterial 7.267 (L) 7.35 - 7.45   pCO2 arterial 38.4 32 - 48 mmHg   pO2, Arterial 219 (H) 83 - 108 mmHg   Bicarbonate 17.9 (L) 20.0 - 28.0 mmol/L   TCO2 19 (L) 22 - 32 mmol/L   O2 Saturation 100 %   Acid-base deficit 9.0 (H) 0.0 - 2.0 mmol/L   Sodium 118 (LL) 135 - 145 mmol/L   Potassium 3.4 (L) 3.5 - 5.1 mmol/L   Calcium, Ion 0.87 (LL) 1.15 - 1.40 mmol/L   HCT 30.0 (L) 36.0 - 46.0 %   Hemoglobin 10.2 (L) 12.0 - 15.0 g/dL   Patient temperature 09.6 F    Sample type ARTERIAL      ROS:  Review of systems not obtained due to patient factors.  Physical Exam: Vitals:   07/28/23 0845 07/28/23 0900  BP:  100/68  Pulse: 70 70  Resp: 19 (!) 25  Temp: (!) 95.7 F (35.4 C) (!) 95.7 F (35.4 C)  SpO2: (!) 89% 90%     General: thin, pale woman sedated on vent-  looks much older than her age HEENT: PERRLA, EOMI  Neck: no JVD Heart: RRR Lungs: CBS bilat Abdomen: distended slightly  Extremities:minimal edema  Skin:pale, dry Neuro: sedated on vent  Assessment/Plan: 67 year old WF with cirrhosis, COPD found down.  Unknown renal function baseline but now oligo/anuric AKI 1.Renal- oligoanuric AKI -  likely due to profound  hypotension on an ACE , mild rhabdo with ck of over 500.  No urine available for review-  imaging does not indicate hydro.  Due to acidosis need to support with CRRT right now-  4 K bath, ,no AC, keep even at first-  may be able to pull volume soon 2. Hypertension/volume  - appears to have septic shock although no source identified-  no obvious abnormalities on bedside echo-  pressors/zosyn and vanc per CCM-  with elevated CVP and some edema would think maybe slightly hypervolemic BUT found down does not reallly go along with that  3. Hypokalemia-  found down, poor nutrition-  all 4  k bath with CRRT 4. Metabolic acidosis-  initial lactate high-  better after IVF-  worsening acidosis overnight -  seemed to be main reson for intubation-  CRRT should correct  4. Anemia  - could be due to GIB, CKD-  interestingly went from 8's to 10's without intervention    Cecille Aver 07/28/2023, 9:08 AM

## 2023-07-28 NOTE — ED Notes (Signed)
Pt given 8 oz cup of ice water per request.

## 2023-07-28 NOTE — ED Notes (Signed)
Oxygen via [place] increased to 6 L due to ABG results- Dr Clayborne Dana aware.

## 2023-07-28 NOTE — ED Notes (Signed)
Pt on phone talking with sister at this time- pt assisted with dialing phone number-pt aware that she is going to [facility]- ICU

## 2023-07-28 NOTE — Progress Notes (Addendum)
Pharmacy Antibiotic Note  Carol Rowe is a 67 y.o. female admitted on 07/27/2023 with  ARF, sepsis, shock   Pharmacy has been consulted for vancomycin and pip-tazo dosing. Cr is ~6 mg/dl. Vancomycin 1000mg  given in ED ~2300.  Plan: Zosyn 2.25g IV q8h Will check VR prior to redosing - likely 10/20 am  Height: 5\' 3"  (160 cm) Weight: 55.8 kg (123 lb 0.3 oz) IBW/kg (Calculated) : 52.4  Temp (24hrs), Avg:90.5 F (32.5 C), Min:86.7 F (30.4 C), Max:92.7 F (33.7 C)  Recent Labs  Lab 07/27/23 2201 07/27/23 2243 07/28/23 0005  WBC 15.2*  --   --   CREATININE 6.84*  --  6.23*  LATICACIDVEN  --  4.3*  --     Estimated Creatinine Clearance: 7.4 mL/min (A) (by C-G formula based on SCr of 6.23 mg/dL (H)).    Allergies  Allergen Reactions   Macrobid [Nitrofurantoin Macrocrystal] Hives   Mirtazapine Swelling    Fredonia Highland, PharmD, BCPS, [facility] Clinical Pharmacist Please check AMION for all [facility] Pharmacy numbers 07/28/2023

## 2023-07-28 NOTE — ED Notes (Signed)
Pt sleeping at this time- pt will awake to verbal command and touch.

## 2023-07-28 NOTE — Progress Notes (Signed)
eLink Physician-Brief Progress Note Patient Name: Carol Rowe DOB: [birth year] MRN: [id number]   Date of Service  07/28/2023  HPI/Events of Note  67 yr old female with alcohol use disorder and cirrhosis presented to [facility] after being found down.  Multiple problems identified.  Hypoglycemia, hyponatremia, acute renal failure, hypotension.  Focal neurologic signs prompted neuro eval and ultimately findings felt to likely be metabolic.  Patient now admitted to the ICU requiring vasopressor support.  ABX given.  Dextrose source.  Volume repletion.    eICU Interventions  Chart reviewed     Intervention Category Evaluation Type: New Patient Evaluation  Henry Russel, P 07/28/2023, 5:05 AM

## 2023-07-28 NOTE — Procedures (Signed)
Arterial Catheter Insertion Procedure Note  Carol Rowe  [id number]  [birth year]  Date:07/28/23  Time:4:46 AM    Provider Performing: Janann Colonel    Procedure: Insertion of Arterial Line (72536) with US guidance (64403)   Indication(s) Blood pressure monitoring and/or need for frequent ABGs  Consent Unable to obtain consent due to emergent nature of procedure.  Anesthesia None  Time Out Verified patient identification, verified procedure, site/side was marked, verified correct patient position, special equipment/implants available, medications/allergies/relevant history reviewed, required imaging and test results available.  Sterile Technique Maximal sterile technique including full sterile barrier drape, hand hygiene, sterile gown, sterile gloves, mask, hair covering, sterile ultrasound probe cover (if used).  Procedure Description Area of catheter insertion was cleaned with chlorhexidine and draped in sterile fashion. With real-time ultrasound guidance an arterial catheter was placed into the left radial artery.  Appropriate arterial tracings confirmed on monitor.     Complications/Tolerance None; patient tolerated the procedure well.  EBL Minimal  Specimen(s) None   Janann Colonel, MD [facility] 07/28/2023 4:47 AM

## 2023-07-28 NOTE — ED Notes (Signed)
ED TO INPATIENT HANDOFF REPORT  ED Nurse Name and Phone #: Claris Gower Essie Lagunes [phone]  S Name/Age/Gender Carol Rowe 67 y.o. female Room/Bed: APA02/APA02  Code Status   Code Status: Not on file  Home/SNF/Other Home Patient oriented to: self, place, time, and situation Is this baseline? Yes   Triage Complete: Triage complete  Chief Complaint Shock [facility]) [R57.9]  Triage Note Pt fell at home alone, sister in law called EMS, unknown as to how family was aware of fall- EMS reports pt was alone, lying on back after fall, pt remembers falling, says she has been weak for couple days, pt CBG was 32 when EMS arrived, systolic BP was 65. Pt alert and oriented x 4. Recheck CBG was 109 in triage. Dr Hyacinth Meeker at bedside. EMS told this nurse pt was last seen normal Sunday by family. Pt endorses having one alcoholic beverage, pt also endorses taking blood thinners but does not know the name.    Allergies Allergies  Allergen Reactions   Macrobid [Nitrofurantoin Macrocrystal] Hives   Mirtazapine Swelling    Level of Care/Admitting Diagnosis ED Disposition     ED Disposition  Admit   Condition  --   Comment  Hospital [facility] [[id number]]  Level of Care: ICU [6]  May admit patient to [facility] or [facility] if equivalent level of care is available:: No  Interfacility transfer: Yes  Covid Evaluation: Asymptomatic - no recent exposure (last 10 days) testing not required  Diagnosis: Shock [facility]) [301601]  Admitting Physician: Janann Colonel [[id number]]  Attending Physician: Janann Colonel [[id number]]  Certification:: I certify this patient will need inpatient services for at least 2 midnights  Expected Medical Readiness: 07/30/2023          B Medical/Surgery History Past Medical History:  Diagnosis Date   Alcoholic (HCC)    Arthritis    Hypertension    VIN III (vulvar intraepithelial neoplasia III)    Vulvar lesion    Past Surgical History:   Procedure Laterality Date   ABDOMINAL HYSTERECTOMY  1989   CESAREAN SECTION     CHOLECYSTECTOMY     COLONOSCOPY N/A 12/10/2019   Procedure: COLONOSCOPY;  Surgeon: [name], MD;  Location: [facility];  Service: Endoscopy;  Laterality: N/A;  730   POLYPECTOMY  12/10/2019   Procedure: POLYPECTOMY;  Surgeon: [name], MD;  Location: [facility];  Service: Endoscopy;;   skin cancer removal       A IV Location/Drains/Wounds Patient Lines/Drains/Airways Status     Active Line/Drains/Airways     Name Placement date Placement time Site Days   Peripheral IV 07/27/23 22 G Right Forearm 07/27/23  2111  Forearm  1   Peripheral IV 07/27/23 20 G 1" Anterior;Left Forearm 07/27/23  2222  Forearm  1   CVC Triple Lumen 07/27/23 Right 07/27/23  2325  -- 1   Urethral Catheter R. Wilson RN Straight-tip;Temperature probe 14 Fr. 07/27/23  2245  Straight-tip;Temperature probe  1            Intake/Output Last 24 hours  Intake/Output Summary (Last 24 hours) at 07/28/2023 0022 Last data filed at 07/27/2023 2250 Gross per 24 hour  Intake 1000 ml  Output --  Net 1000 ml    Labs/Imaging Results for orders placed or performed during the hospital encounter of 07/27/23 (from the past 48 hour(s))  CBG monitoring, ED     Status: Abnormal   Collection Time: 07/27/23  9:14 PM  Result Value Ref Range   Glucose-Capillary 109 (H) 70 - 99 mg/dL    Comment: Glucose reference range applies only to samples taken after fasting for at least 8 hours.  Ethanol     Status: Abnormal   Collection Time: 07/27/23 10:01 PM  Result Value Ref Range   Alcohol, Ethyl (B) 10 (H) <10 mg/dL    Comment: (NOTE) Lowest detectable limit for serum alcohol is 10 mg/dL.  For medical purposes only. Performed at [facility], [address], [place], [place] [address]   Protime-INR     Status: Abnormal   Collection Time: 07/27/23 10:01 PM  Result Value Ref Range   Prothrombin Time 15.5 (H) 11.4 - 15.2 seconds    INR 1.2 0.8 - 1.2    Comment: (NOTE) INR goal varies based on device and disease states. Performed at [facility], [address] [place], [place] [address]   APTT     Status: Abnormal   Collection Time: 07/27/23 10:01 PM  Result Value Ref Range   aPTT 39 (H) 24 - 36 seconds    Comment:        IF BASELINE aPTT IS ELEVATED, SUGGEST PATIENT RISK ASSESSMENT BE USED TO DETERMINE APPROPRIATE ANTICOAGULANT THERAPY. Performed at [facility], [address] [place], [place] [address]   CBC     Status: Abnormal   Collection Time: 07/27/23 10:01 PM  Result Value Ref Range   WBC 15.2 (H) 4.0 - 10.5 K/uL   RBC 2.65 (L) 3.87 - 5.11 MIL/uL   Hemoglobin 8.8 (L) 12.0 - 15.0 g/dL   HCT 62.9 (L) 52.8 - 41.3 %   MCV 103.0 (H) 80.0 - 100.0 fL   MCH 33.2 26.0 - 34.0 pg   MCHC 32.2 30.0 - 36.0 g/dL   RDW 24.4 (H) 01.0 - 27.2 %   Platelets 227 150 - 400 K/uL   nRBC 0.7 (H) 0.0 - 0.2 %    Comment: Performed at [facility], [address], [place], [place] [address]  Differential     Status: Abnormal   Collection Time: 07/27/23 10:01 PM  Result Value Ref Range   Neutrophils Relative % 95 %   Neutro Abs 14.3 (H) 1.7 - 7.7 K/uL   Lymphocytes Relative 3 %   Lymphs Abs 0.5 (L) 0.7 - 4.0 K/uL   Monocytes Relative 2 %   Monocytes Absolute 0.3 0.1 - 1.0 K/uL   Eosinophils Relative 0 %   Eosinophils Absolute 0.0 0.0 - 0.5 K/uL   Basophils Relative 0 %   Basophils Absolute 0.0 0.0 - 0.1 K/uL   Immature Granulocytes 0 %   Abs Immature Granulocytes 0.05 0.00 - 0.07 K/uL    Comment: Performed at [facility], [address] [place], [place] [address]  Comprehensive metabolic panel     Status: Abnormal   Collection Time: 07/27/23 10:01 PM  Result Value Ref Range   Sodium 122 (L) 135 - 145 mmol/L   Potassium 2.0 (LL) 3.5 - 5.1 mmol/L    Comment: CRITICAL RESULT CALLED TO, READ BACK BY AND VERIFIED WITH CLINTON,A ON 07/27/23 AT 2310 BY LOY,C   Chloride 80 (L) 98 - 111 mmol/L   CO2 14 (L) 22 - 32  mmol/L   Glucose, Bld 84 70 - 99 mg/dL    Comment: Glucose reference range applies only to samples taken after fasting for at least 8 hours.   BUN 37 (H) 8 - 23 mg/dL   Creatinine, Ser 4.25 (H) 0.44 -  1.00 mg/dL   Calcium 7.0 (L) 8.9 - 10.3 mg/dL   Total Protein 6.3 (L) 6.5 - 8.1 g/dL   Albumin 3.2 (L) 3.5 - 5.0 g/dL   AST 67 (H) 15 - 41 U/L   ALT 32 0 - 44 U/L   Alkaline Phosphatase 208 (H) 38 - 126 U/L   Total Bilirubin 1.0 0.3 - 1.2 mg/dL   GFR, Estimated 6 (L) >60 mL/min    Comment: (NOTE) Calculated using the CKD-EPI Creatinine Equation (2021)    Anion gap 28 (H) 5 - 15    Comment: Electrolytes repeated to confirm. Electrolytes repeated to confirm. Performed at [facility], [address] [place], [place] [address]   CK     Status: Abnormal   Collection Time: 07/27/23 10:01 PM  Result Value Ref Range   Total CK 519 (H) 38 - 234 U/L    Comment: Performed at [facility] [address], [place], [place] [address]  Blood culture (routine x 2)     Status: None (Preliminary result)   Collection Time: 07/27/23 10:38 PM   Specimen: BLOOD RIGHT HAND  Result Value Ref Range   Specimen Description BLOOD RIGHT HAND BLOOD    Special Requests      BOTTLES DRAWN AEROBIC AND ANAEROBIC Blood Culture adequate volume Performed at [facility], [address], [place], [place] [address]    Culture PENDING    Report Status PENDING   Lactic acid, plasma     Status: Abnormal   Collection Time: 07/27/23 10:43 PM  Result Value Ref Range   Lactic Acid, Venous 4.3 (HH) 0.5 - 1.9 mmol/L    Comment: CRITICAL RESULT CALLED TO, READ BACK BY AND VERIFIED WITH ABBY CHESTER 2330 07/27/23 NN Performed at [facility], [address] [place], [place] [address]   Blood culture (routine x 2)     Status: None (Preliminary result)   Collection Time: 07/27/23 10:49 PM   Specimen: Left Antecubital; Blood  Result Value Ref Range   Specimen Description LEFT ANTECUBITAL BLOOD    Special Requests       BOTTLES DRAWN AEROBIC AND ANAEROBIC Blood Culture results may not be optimal due to an excessive volume of blood received in culture bottles Performed at [facility], [address] [place], [place] [address]    Culture PENDING    Report Status PENDING    DG Chest Port 1 View  Result Date: 07/27/2023 CLINICAL DATA:  sick.  Central line placement EXAM: PORTABLE CHEST 1 VIEW COMPARISON:  None Available. FINDINGS: Right internal jugular central venous catheter tip overlying the right atrium. The heart and mediastinal contours are within normal limits. Atherosclerotic plaque. Bibasilar atelectasis. No focal consolidation. No pulmonary edema. No pleural effusion. No pneumothorax. No acute osseous abnormality.  Old healed left rib fractures. IMPRESSION: Right internal jugular central venous catheter tip overlying the right atrium. Recommend retraction by 2 cm. Electronically Signed   By: Tish Frederickson M.D.   On: 07/27/2023 23:39   CT Cervical Spine Wo Contrast  Result Date: 07/27/2023 CLINICAL DATA:  Fall EXAM: CT CERVICAL SPINE WITHOUT CONTRAST TECHNIQUE: Multidetector CT imaging of the cervical spine was performed without intravenous contrast. Multiplanar CT image reconstructions were also generated. RADIATION DOSE REDUCTION: This exam was performed according to the departmental dose-optimization program which includes automated exposure control, adjustment of the mA and/or kV according to patient size and/or use of iterative reconstruction technique. COMPARISON:  None Available. FINDINGS: Alignment: No traumatic listhesis.  Trace anterolisthesis C4  on C5. Skull base and vertebrae: No acute fracture. No primary bone lesion or focal pathologic process. Soft tissues and spinal canal: No prevertebral fluid or swelling. No visible canal hematoma. Left thyroid nodule measures up to 2.9 cm. Disc levels: Degenerative changes in the cervical spine. No high-grade spinal canal stenosis. Upper chest: No focal  pulmonary opacity or pleural effusion. Emphysema. IMPRESSION: 1. No acute cervical spine fracture or traumatic listhesis. 2. Left thyroid nodule measuring up to 2.9 cm. If this has not previously been evaluated, a non-emergent ultrasound of the thyroid is recommended. (Reference: J Am Coll Radiol. 2015 Feb;12(2): 143-50) 3. Emphysema. Emphysema (ICD10-J43.9). Electronically Signed   By: Wiliam Ke M.D.   On: 07/27/2023 21:58   CT HEAD CODE STROKE WO CONTRAST  Result Date: 07/27/2023 CLINICAL DATA:  Code stroke. Hypoglycemia, neuro deficit, weak for several days EXAM: CT HEAD WITHOUT CONTRAST TECHNIQUE: Contiguous axial images were obtained from the base of the skull through the vertex without intravenous contrast. RADIATION DOSE REDUCTION: This exam was performed according to the departmental dose-optimization program which includes automated exposure control, adjustment of the mA and/or kV according to patient size and/or use of iterative reconstruction technique. COMPARISON:  None Available. FINDINGS: Brain: No evidence of acute infarction, hemorrhage, mass, mass effect, or midline shift. No hydrocephalus or extra-axial collection. Periventricular white matter changes, likely the sequela of chronic small vessel ischemic disease. Vascular: No hyperdense vessel. Skull: Negative for fracture or focal lesion. Sinuses/Orbits: No acute finding. Other: The mastoid air cells are well aerated. ASPECTS [facility] Stroke Program Early CT Score) - Ganglionic level infarction (caudate, lentiform nuclei, internal capsule, insula, M1-M3 cortex): 7 - Supraganglionic infarction (M4-M6 cortex): 3 Total score (0-10 with 10 being normal): 10 IMPRESSION: 1. No acute intracranial process. 2. ASPECTS is 10. Code stroke imaging results were communicated on 07/27/2023 at 9:55 pm to provider Hyacinth Meeker via telephone, who verbally acknowledged these results. Electronically Signed   By: Wiliam Ke M.D.   On: 07/27/2023 21:55     Pending Labs Unresulted Labs (From admission, onward)     Start     Ordered   07/27/23 2357  Basic metabolic panel  Once,   STAT        07/27/23 2356   07/27/23 2351  Blood gas, arterial (at [facility] & [facility])  Once,   R        07/27/23 2350   07/27/23 2121  Urine rapid drug screen (hosp performed)  Once,   STAT        07/27/23 2120   07/27/23 2121  Urinalysis, Routine w reflex microscopic -Urine, Clean Catch  Once,   URGENT       Question:  Specimen Source  Answer:  Urine, Clean Catch   07/27/23 2120            Vitals/Pain Today's Vitals   07/27/23 2355 07/28/23 0000 07/28/23 0010 07/28/23 0020  BP: 95/67 94/67 112/69 (!) 85/63  Pulse: 62 62 65 66  Resp: 18 (!) 22 20 19   Temp: (!) 89.9 F (32.2 C) (!) 90 F (32.2 C) (!) 90.3 F (32.4 C) (!) 90.6 F (32.6 C)  TempSrc:      SpO2: 99% 98% 96% 95%  Weight:      Height:        Isolation Precautions No active isolations  Medications Medications  norepinephrine (LEVOPHED) 4mg  in (0.016 mg/mL) premix infusion (8 mcg/min Intravenous Rate/Dose Change 07/28/23 0021)  potassium chloride 10 mEq in 100 mL  IVPB (has no administration in time range)  magnesium sulfate IVPB 2 g 50 mL (2 g Intravenous New Bag/Given 07/28/23 0005)  potassium chloride 10 mEq in 100 mL IVPB (10 mEq Intravenous New Bag/Given 07/28/23 0001)  lactated ringers bolus 1,000 mL (0 mLs Intravenous Stopped 07/27/23 2250)  piperacillin-tazobactam (ZOSYN) IVPB 3.375 g (0 g Intravenous Stopped 07/27/23 2319)  vancomycin (VANCOCIN) IVPB 1000 mg/200 mL premix (0 mg Intravenous Stopped 07/28/23 0009)  lactated ringers bolus 1,000 mL (0 mLs Intravenous Stopped 07/27/23 2332)    Mobility walks     Focused Assessments    R Recommendations: See Admitting Provider Note  Report given to:   Additional Notes:

## 2023-07-28 NOTE — Progress Notes (Signed)
Initial Nutrition Assessment  DOCUMENTATION CODES:   Not applicable  INTERVENTION:  TF via NGT: Start Vital 1.5 at 15ml and advance by 10ml q10h to a goal rate of 60ml/h ( per day) 60ml ProSource TF BID Goal TF regimen provides: 1780 kcal, 113g protein and free water per day  Suspect pt at elevated refeeding risk in setting of EtOH use Pt also likely to experience decrease in lytes 2/2 initiation of CRRT, recommend monitoring potassium, magnesium and phosphorus BID for at least 3 days; MD to replete as necessary  Renal MVI with minerals daily per tube   NUTRITION DIAGNOSIS:   Inadequate oral intake related to acute illness as evidenced by NPO status.  GOAL:   Patient will meet greater than or equal to 90% of their needs  MONITOR:   Vent status, Labs, Weight trends, I & O's, TF tolerance  REASON FOR ASSESSMENT:   Consult, Ventilator Enteral/tube feeding initiation and management (CRRT protocol)  ASSESSMENT:   Pt transferred from [facility] with diagnosis of shock. PMH significant for alcohol use disorder, alcoholic liver cirrhosis, essential HTN, presumed COPD.  10/18: admitted w/hypoglycemia, metabolic encephalopathy, severe AKI 10/19: intubated; non-TDC placed for CRRT initiation  Noted plans for initiation of CRRT.  NGT in place. RN advanced and re-ordered xray d/t side port near GEJ. Updated abd xray pending.   Patient is currently intubated on ventilator support MV: 9 L/min Temp (24hrs), Avg:93.4 F (34.1 C), Min:86.7 F (30.4 C), Max:98.4 F (36.9 C) MAP (a-line) 71  No recently documented weights on file to review within the last year to assess for weight changes. Documented weight on 10/18 was 55.8 kg unclear whether this is actual versus stated weight.  Weight today prior to initiation of CRRT- 60.7 kg +deep/very deep pitting BLE  Medications: folvite, solu-cortef, MVI, IV Protonix, IV thiamine Drips:  Calcium gluconate Levo: 22  mcg/min Zosyn Vanc Vaso: 0.03 units/min  Labs: sodium 118 (L), potassium 3.4 (L)-repletion ordered, BUN 37, Cr 6.45, anion gap 23, ionized Ca 0.87, Phos 8.7(H), alkaline phos 208, AST 67, GFR 7, CBG's 48-220 x24 hours  NUTRITION - FOCUSED PHYSICAL EXAM: RD Working remotely. Deferred to follow up.   Diet Order:   Diet Order             Diet NPO time specified Except for: Sips with Meds  Diet effective now                   EDUCATION NEEDS:   No education needs have been identified at this time  Skin:  Skin Assessment: Reviewed RN Assessment  Last BM:  unknown/PTA; distended/taut per RN  Height:   Ht Readings from Last 1 Encounters:  07/27/23 5\' 3"  (1.6 m)    Weight:   Wt Readings from Last 1 Encounters:  07/28/23 60.7 kg   BMI:  Body mass index is 23.71 kg/m.  Estimated Nutritional Needs:   Kcal:  1600-1800  Protein:  105-115g  Fluid:  1L + UOP  Drusilla Kanner, RDN, LDN Clinical Nutrition

## 2023-07-28 NOTE — Progress Notes (Signed)
NAME:  Carol Rowe, MRN:  [id number], DOB:  1956/01/06, LOS: 1 ADMISSION DATE:  07/27/2023 CHIEF COMPLAINT:  Shock   History of Present Illness:  This is a case of a 67 year old female patient with a past medical history of alcohol use disorder, alcoholic liver cirrhosis, essential hypertension and presumed COPD presenting to [facility] as a transfer from [facility] for shock.  History was mainly taken from the patient at bedside and chart dissection.  She initially presented to [facility] on 10/18 after a family member went over to her house and found her down.  She apparently was walking to the bathroom and fell down and struck her head.  EMS were called by family members and on arrival she was found down hypoglycemic at the 30s.  She was given IV dextrose and transferred to [facility].  Last known normal 6 days ago.  In the ED she was found to be hypotensive, altered with left-sided focal deficits.  Therefore a code stroke was called and a stat CT head was obtained which was nonrevealing.  Neurology was consulted and felt less likely to be stroke as no focal weakness was present on their exam.  Felt this was secondary to metabolic encephalopathy.  Furthermore, labs were initially concerning for leukocytosis with a WBC count of 15.2.  Hyponatremia with a sodium of 122 and severe hypokalemia with a potassium of 2.0.  Also noted to have severe AKI with a creatinine of 6.84 no previous labs on file to determine baseline. Lactic acidosis also noted at 4.3. She was given 3 L normal saline and started on norepinephrine.  Blood culture was drawn and given broad-spectrum antibiotics.  Also central line was placed at [facility].  Chest x-ray with no focal consolidation.  Transferred here for further management.  On encounter. Patient A&O x3 to self time and place. 1L LR given and again noted to be hypoglycemic requiring another D50 push. She was on 12 mcg/min NE. A-line placed. Continue  on broad spec abx pending culture data. Remained Anuric, CT A/P pending.  Bedside echocardiogram with normal EF. RV not enlarge and no pericardial effusion. Repeat labs with normalized lactic acid.   Objective   Blood pressure 103/67, pulse 74, temperature (!) 96.6 F (35.9 C), resp. rate 12, height 5\' 3"  (1.6 m), weight 60.7 kg, SpO2 90%. CVP:  [15 mmHg-18 mmHg] 15 mmHg  Vent Mode: PRVC FiO2 (%):  [50 %-100 %] 50 % Set Rate:  [22 bmp] 22 bmp Vt Set:  [410 mL] 410 mL PEEP:  [5 cmH20] 5 cmH20 Plateau Pressure:  [14 cmH20-16 cmH20] 16 cmH20 Intake/Output Summary (Last 24 hours) at 07/28/2023 1049 Last data filed at 07/28/2023 1000 Gross per 24 hour  Intake 4114.61 ml  Output --  Net 4114.61 ml   Filed Weights   07/27/23 2117 07/28/23 0500  Weight: 55.8 kg 60.7 kg    Examination: General: critically ill appearing woman lying in bed in NAD HENT: [place]/AT, eyes anicteric Lungs: breathing comfortably on MV, breathing over the vent Cardiovascular: S1S2, RRR Abdomen: distended, soft, NT Extremities: +edema, no cyanosis, R femoral HD catheter Derm: jaundice Neuro: huge pupils briskly reactive, breathes over the vent. RASS -5.   7.27/38/219/18 Na+  121 K+ 2.2 Bicarb 18 BUN 37 Cr 6.45 Phos 8.7 WBC 9.2 H/H 8.6/25.7 Platelets 194  Blood culture: NGTD  Ct personally reviewed> no pneumonia, some basilar lung scarring. Mild dependent pleural effusions. No obvious intraabdominal source of infection  UA: not able to be collected   KUB personally reviewed> NG with side ports above GE junction   Assessment & Plan:  Septic shock, unknown source -con't broad antibiotics- vanc, zosyn -vasopressors to maintain MAP>65, norepi, vaso currently -follow blood cultures  -stress dose steroids -echo pending  Anemia -transfuse for hb <7 or hemodynamically significant bleeding -monitor  AKI, oliguric Hyponatremia hypokalemia AGMA; worsening Hyperphosphatemia -CRRT per nephrology;  appreciate their management -CRRT will correct electrolytes moderately -strict I/O -renally dose meds, avoid nephrotoxic meds  Hypocalcemia -repleted x2   Acute respiratory failure with hypoxia requiring MV -LTVV -VAP prevention protocol -PAD protocol for sedation -daily ST & SBT as appropriate; remains too unstable  H/o ETOH abuse Cirrhosis, acutely decompensated. MELD-Na+ 29 Mild coagulopathy -d/c ciwa protocol; currently not requiring any sedation -vitamins, start TF -check ammonia  Hypoglycemia, resolved, now hyperglycemic -stop d10w, monitor accuchecks -hold on insulin for now  Moderate protein energy malnutrition -TF  No family at bedside today. Son Will Parmelee updated via phone. I let her son know how il she is-- she's requiring 3 types of life support- the vent, pressors, CRRT. He confirmed she would want continued aggressive care.    Best Practice (right click and "Reselect all SmartList Selections" daily)   Diet/type: tubefeeds DVT prophylaxis: prophylactic heparin  GI prophylaxis: PPI Lines: Central line Foley:  Yes, and it is still needed Code Status:  full code     This patient is critically ill with multiple organ system failure which requires frequent high complexity decision making, assessment, support, evaluation, and titration of therapies. This was completed through the application of advanced monitoring technologies and extensive interpretation of multiple databases. During this encounter critical care time was devoted to patient care services described in this note for 50 minutes.  Steffanie Dunn, DO 07/28/23 3:58 PM [facility]  For contact information, see Amion. If no response to pager, please call PCCM consult pager. After hours, 7PM- 7AM, please call Elink.

## 2023-07-28 NOTE — ED Notes (Signed)
[facility] 5 minutes out- ED sec was notified- [facility] RN said she will get report when they arrive, has already looked through pt chart.

## 2023-07-28 NOTE — ED Notes (Signed)
Pt has eyeglasses and cell phone in belongings bag along with clothes (long sleeve shirt had to be cut off pt) pt was informed of this

## 2023-07-28 NOTE — Progress Notes (Signed)
Obtained ABG at 1500, results will not upload into EPIC. Results were as follows:   pH: 7.147 PCO2: 42.2 PO2: 78 BE,B: -14 HCO3: 14.5 TCO2: 16 SO2%: 90  See new orders and EMAR.

## 2023-07-28 NOTE — Procedures (Signed)
Intubation Procedure Note  VEOLA HEGLAND  [id number]  06-03-1956  Date:07/28/23  Time:6:55 AM   Provider Performing:Jean-Pierre Khiem Gargis   Procedure: Intubation (31500)  Indication(s) Respiratory Failure  Consent Unable to obtain consent due to emergent nature of procedure.  Anesthesia Rocuronium and Ketamine. Patient was obtunded and had received ativan for CIWA.  Time Out Verified patient identification, verified procedure, site/side was marked, verified correct patient position, special equipment/implants available, medications/allergies/relevant history reviewed, required imaging and test results available.  Sterile Technique Usual hand hygeine, masks, and gloves were used  Procedure Description Patient positioned in bed supine.  Sedation given as noted above.  Patient was intubated with endotracheal tube using Glidescope.  View was Grade 1 full glottis .  Number of attempts was 1.  Colorimetric CO2 detector was consistent with tracheal placement.  Complications/Tolerance None; patient tolerated the procedure well. Chest X-ray is ordered to verify placement.  EBL Minimal  Specimen(s) None  Janann Colonel, MD [facility] 07/28/2023 6:57 AM

## 2023-07-28 NOTE — Plan of Care (Signed)

## 2023-07-28 NOTE — Progress Notes (Signed)
Assisted with intubation.

## 2023-07-28 NOTE — Progress Notes (Signed)
Advanced orogastric tube 5cm per order. Pt tolerated well. Awaiting KUB to confirm placement.   Christain Sacramento, RN

## 2023-07-28 NOTE — ED Notes (Signed)
[facility] called for transport to [facility].  

## 2023-07-28 NOTE — Progress Notes (Signed)
  Echocardiogram 2D Echocardiogram has been performed.  Carol Rowe 07/28/2023, 2:41 PM

## 2023-07-29 DIAGNOSIS — Z9911 Dependence on respirator [ventilator] status: Secondary | ICD-10-CM | POA: Diagnosis not present

## 2023-07-29 DIAGNOSIS — R6521 Severe sepsis with septic shock: Secondary | ICD-10-CM

## 2023-07-29 DIAGNOSIS — A419 Sepsis, unspecified organism: Secondary | ICD-10-CM

## 2023-07-29 DIAGNOSIS — J181 Lobar pneumonia, unspecified organism: Secondary | ICD-10-CM

## 2023-07-29 DIAGNOSIS — R579 Shock, unspecified: Secondary | ICD-10-CM | POA: Diagnosis not present

## 2023-07-29 DIAGNOSIS — J9601 Acute respiratory failure with hypoxia: Secondary | ICD-10-CM

## 2023-07-29 LAB — BASIC METABOLIC PANEL
Anion gap: 16 — ABNORMAL HIGH (ref 5–15)
BUN: 17 mg/dL (ref 8–23)
CO2: 23 mmol/L (ref 22–32)
Calcium: 7.1 mg/dL — ABNORMAL LOW (ref 8.9–10.3)
Chloride: 90 mmol/L — ABNORMAL LOW (ref 98–111)
Creatinine, Ser: 3.01 mg/dL — ABNORMAL HIGH (ref 0.44–1.00)
GFR, Estimated: 17 mL/min — ABNORMAL LOW (ref >60)
Glucose, Bld: 125 mg/dL — ABNORMAL HIGH (ref 70–99)
Potassium: 3.2 mmol/L — ABNORMAL LOW (ref 3.5–5.1)
Sodium: 129 mmol/L — ABNORMAL LOW (ref 135–145)

## 2023-07-29 LAB — RENAL FUNCTION PANEL
Albumin: 2.2 g/dL — ABNORMAL LOW (ref 3.5–5.0)
Albumin: 2.1 g/dL — ABNORMAL LOW (ref 3.5–5.0)
Anion gap: 17 — ABNORMAL HIGH (ref 5–15)
Anion gap: 11 (ref 5–15)
BUN: 17 mg/dL (ref 8–23)
BUN: 13 mg/dL (ref 8–23)
CO2: 23 mmol/L (ref 22–32)
CO2: 23 mmol/L (ref 22–32)
Calcium: 7.1 mg/dL — ABNORMAL LOW (ref 8.9–10.3)
Calcium: 7.2 mg/dL — ABNORMAL LOW (ref 8.9–10.3)
Chloride: 90 mmol/L — ABNORMAL LOW (ref 98–111)
Chloride: 96 mmol/L — ABNORMAL LOW (ref 98–111)
Creatinine, Ser: 2.98 mg/dL — ABNORMAL HIGH (ref 0.44–1.00)
Creatinine, Ser: 2.02 mg/dL — ABNORMAL HIGH (ref 0.44–1.00)
GFR, Estimated: 17 mL/min — ABNORMAL LOW (ref >60)
GFR, Estimated: 27 mL/min — ABNORMAL LOW (ref >60)
Glucose, Bld: 125 mg/dL — ABNORMAL HIGH (ref 70–99)
Glucose, Bld: 166 mg/dL — ABNORMAL HIGH (ref 70–99)
Phosphorus: 3.6 mg/dL (ref 2.5–4.6)
Phosphorus: 2 mg/dL — ABNORMAL LOW (ref 2.5–4.6)
Potassium: 3.2 mmol/L — ABNORMAL LOW (ref 3.5–5.1)
Potassium: 4.4 mmol/L (ref 3.5–5.1)
Sodium: 130 mmol/L — ABNORMAL LOW (ref 135–145)
Sodium: 130 mmol/L — ABNORMAL LOW (ref 135–145)

## 2023-07-29 LAB — URINALYSIS, ROUTINE W REFLEX MICROSCOPIC
Bilirubin Urine: NEGATIVE
Glucose, UA: NEGATIVE mg/dL
Ketones, ur: NEGATIVE mg/dL
Nitrite: NEGATIVE
Protein, ur: 100 mg/dL — AB
Specific Gravity, Urine: 1.021 (ref 1.005–1.030)
WBC, UA: >50 WBC/hpf (ref 0–5)
pH: 5 (ref 5.0–8.0)

## 2023-07-29 LAB — CBC
HCT: 27.2 % — ABNORMAL LOW (ref 36.0–46.0)
Hemoglobin: 9.3 g/dL — ABNORMAL LOW (ref 12.0–15.0)
MCH: 33 pg (ref 26.0–34.0)
MCHC: 34.2 g/dL (ref 30.0–36.0)
MCV: 96.5 fL (ref 80.0–100.0)
Platelets: 124 10*3/uL — ABNORMAL LOW (ref 150–400)
RBC: 2.82 MIL/uL — ABNORMAL LOW (ref 3.87–5.11)
RDW: 16.4 % — ABNORMAL HIGH (ref 11.5–15.5)
WBC: 14.3 10*3/uL — ABNORMAL HIGH (ref 4.0–10.5)
nRBC: 0.5 % — ABNORMAL HIGH (ref 0.0–0.2)

## 2023-07-29 LAB — POCT I-STAT 7, (LYTES, BLD GAS, ICA,H+H)
Acid-Base Excess: 2 mmol/L (ref 0.0–2.0)
Bicarbonate: 27.6 mmol/L (ref 20.0–28.0)
Calcium, Ion: 0.96 mmol/L — ABNORMAL LOW (ref 1.15–1.40)
HCT: 28 % — ABNORMAL LOW (ref 36.0–46.0)
Hemoglobin: 9.5 g/dL — ABNORMAL LOW (ref 12.0–15.0)
O2 Saturation: 96 %
Patient temperature: 37.5
Potassium: 4.1 mmol/L (ref 3.5–5.1)
Sodium: 127 mmol/L — ABNORMAL LOW (ref 135–145)
TCO2: 29 mmol/L (ref 22–32)
pCO2 arterial: 46.3 mm[Hg] (ref 32–48)
pH, Arterial: 7.385 (ref 7.35–7.45)
pO2, Arterial: 86 mm[Hg] (ref 83–108)

## 2023-07-29 LAB — OSMOLALITY: Osmolality: 273 mosm/kg — ABNORMAL LOW (ref 275–295)

## 2023-07-29 LAB — MAGNESIUM: Magnesium: 1.8 mg/dL (ref 1.7–2.4)

## 2023-07-29 LAB — GLUCOSE, CAPILLARY
Glucose-Capillary: 122 mg/dL — ABNORMAL HIGH (ref 70–99)
Glucose-Capillary: 174 mg/dL — ABNORMAL HIGH (ref 70–99)
Glucose-Capillary: 187 mg/dL — ABNORMAL HIGH (ref 70–99)
Glucose-Capillary: 190 mg/dL — ABNORMAL HIGH (ref 70–99)

## 2023-07-29 LAB — BRAIN NATRIURETIC PEPTIDE: B Natriuretic Peptide: 1384.9 pg/mL — ABNORMAL HIGH (ref 0.0–100.0)

## 2023-07-29 MED ORDER — CALCIUM CARBONATE ANTACID 500 MG PO CHEW
1.0000 | CHEWABLE_TABLET | Freq: Two times a day (BID) | ORAL | Status: DC
  Administered 2023-07-29 – 2023-08-08 (×16): 200 mg
  Filled 2023-07-29 (×17): qty 1

## 2023-07-29 MED ORDER — PROPOFOL 1000 MG/100ML IV EMUL
INTRAVENOUS | Status: AC
  Administered 2023-07-29: 5 ug/kg/min via INTRAVENOUS
  Filled 2023-07-29: qty 100

## 2023-07-29 MED ORDER — PROPOFOL 1000 MG/100ML IV EMUL
5.0000 ug/kg/min | INTRAVENOUS | Status: DC
  Administered 2023-07-29: 50 ug/kg/min via INTRAVENOUS
  Administered 2023-07-30: 45 ug/kg/min via INTRAVENOUS
  Administered 2023-07-30 (×2): 35 ug/kg/min via INTRAVENOUS
  Administered 2023-07-31: 30 ug/kg/min via INTRAVENOUS
  Filled 2023-07-29 (×3): qty 100
  Filled 2023-07-29: qty 200
  Filled 2023-07-29 (×2): qty 100

## 2023-07-29 MED ORDER — MAGNESIUM SULFATE IN D5W 1-5 GM/100ML-% IV SOLN
1.0000 g | Freq: Once | INTRAVENOUS | Status: AC
  Administered 2023-07-29: 1 g via INTRAVENOUS
  Filled 2023-07-29: qty 100

## 2023-07-29 MED ORDER — PRISMASOL BGK 4/2.5 32-4-2.5 MEQ/L REPLACEMENT SOLN
Status: DC
Start: 2023-07-29 — End: 2023-08-04

## 2023-07-29 MED ORDER — LACTULOSE 10 GM/15ML PO SOLN
30.0000 g | Freq: Three times a day (TID) | ORAL | Status: DC
  Administered 2023-07-29 – 2023-07-30 (×3): 30 g
  Filled 2023-07-29 (×3): qty 45

## 2023-07-29 MED ORDER — POTASSIUM CHLORIDE 10 MEQ/50ML IV SOLN
10.0000 meq | INTRAVENOUS | Status: AC
  Administered 2023-07-29 (×6): 10 meq via INTRAVENOUS
  Filled 2023-07-29 (×6): qty 50

## 2023-07-29 NOTE — Progress Notes (Signed)
Subjective:  CRRT running well-  still pressor req with minimal UOP   Objective Vital signs in last 24 hours: Vitals:   07/29/23 0715 07/29/23 0730 07/29/23 0745 07/29/23 0800  BP:    104/62  Pulse: 83 85 84 85  Resp: (!) 22 (!) 22 20 (!) 22  Temp: 99 F (37.2 C) 99 F (37.2 C) 99 F (37.2 C) 99 F (37.2 C)  TempSrc:    Bladder  SpO2: 99% 98% 98% 97%  Weight:      Height:       Weight change: 4.5 kg  Intake/Output Summary (Last 24 hours) at 07/29/2023 0836 Last data filed at 07/29/2023 0800 Gross per 24 hour  Intake 1766.53 ml  Output 1486 ml  Net 280.53 ml    Assessment/Plan: 67 year old WF with cirrhosis, COPD found down.  Unknown renal function baseline but now oligo/anuric AKI 1.Renal- oligoanuric AKI -  likely due to profound hypotension on an ACE , mild rhabdo with ck of over 500.  Made a little urine, 100 of protein but mostly c/w UTI -  imaging does not indicate hydro.  Due to acidosis needed to support with CRRT -  started on 10/19-  4 K bath, ,no AC, keep even at first-  2. Hypertension/volume  - appears to have septic shock although no source identified, possibly UTI -  no obvious abnormalities on bedside echo-  pressors/zosyn and vanc per CCM-  with elevated CVP and some edema would think maybe slightly hypervolemic BUT found down does not reallly go along with that. Given low sodium and better hemodynamics will try some UF with CRRT today  3. Hypokalemia-  found down, poor nutrition-  all 4  k bath with CRRT 4. Metabolic acidosis-  initial lactate high-  better after IVF-  worsening acidosis overnight -  seemed to be main reson for intubation-  CRRT should correct -  yesterday after ABG showing pH under 7.2-  I changed post filter fluid to bicarb-  now that at least co2 better will change post filter back  to dialysate-  should help K as well  4. Anemia  - could be due to GIB, CKD-  interestingly went from 8's to 10's without intervention 5. Elytes-  repleting K and  mag this AM-  changing post filter fluid to dialysate-  phos lower but not needing repletion yet        Cecille Aver    Labs: Basic Metabolic Panel: Recent Labs  Lab 07/28/23 0234 07/28/23 0345 07/28/23 0816 07/28/23 1508 07/29/23 0340  NA 121*   < > 118* 124*  125* 129*  130*  K 2.2*   < > 3.4* 3.7  3.3* 3.2*  3.2*  CL 80*  --   --  92* 90*  90*  CO2 18*  --   --  14* 23  23  GLUCOSE 200*  --   --  108* 125*  125*  BUN 37*  --   --  27* 17  17  CREATININE 6.45*  --   --  4.73* 3.01*  2.98*  CALCIUM 6.8*  --   --  7.0* 7.1*  7.1*  PHOS 8.7*  --   --  6.9* 3.6   < > = values in this interval not displayed.   Liver Function Tests: Recent Labs  Lab 07/27/23 2201 07/28/23 1508 07/29/23 0340  AST 67*  --   --   ALT 32  --   --  ALKPHOS 208*  --   --   BILITOT 1.0  --   --   PROT 6.3*  --   --   ALBUMIN 3.2* 2.5* 2.2*   No results for input(s): "LIPASE", "AMYLASE" in the last 168 hours. Recent Labs  Lab 07/28/23 1725  AMMONIA 50*   CBC: Recent Labs  Lab 07/27/23 2201 07/28/23 0234 07/28/23 0345 07/28/23 1508 07/28/23 1855 07/29/23 0340  WBC 15.2* 9.2  --   --  14.7* 14.3*  NEUTROABS 14.3*  --   --   --   --   --   HGB 8.8* 8.6*   < > 10.5* 9.2* 9.3*  HCT 27.3* 25.7*   < > 31.0* 27.5* 27.2*  MCV 103.0* 97.7  --   --  96.8 96.5  PLT 227 194  --   --  150 124*   < > = values in this interval not displayed.   Cardiac Enzymes: Recent Labs  Lab 07/27/23 2201  CKTOTAL 519*   CBG: Recent Labs  Lab 07/28/23 0815 07/28/23 1506 07/28/23 1951 07/28/23 2349 07/29/23 0339  GLUCAP 172* 115* 99 101* 122*    Iron Studies: No results for input(s): "IRON", "TIBC", "TRANSFERRIN", "FERRITIN" in the last 72 hours. Studies/Results: DG CHEST PORT 1 VIEW  Result Date: 07/29/2023 CLINICAL DATA:  Hypoxia.  Hypoglycemia.  Hypotension.  Fall. EXAM: PORTABLE CHEST 1 VIEW COMPARISON:  07/28/2023 FINDINGS: Endotracheal tube, enteric tube, and right  central venous catheter are unchanged in position. Linear atelectasis or consolidation demonstrated in both lung bases, progressing since prior study. Emphysematous changes and scattered fibrosis in the lungs. No pneumothorax. Thoracolumbar scoliosis convex towards the right. IMPRESSION: Appliances remain unchanged in satisfactory position. Infiltration or atelectasis in the lung bases with progression since prior study. Electronically Signed   By: Burman Nieves M.D.   On: 07/29/2023 00:40   ECHOCARDIOGRAM COMPLETE  Result Date: 07/28/2023    ECHOCARDIOGRAM REPORT   Patient Name:   Carol Rowe Date of Exam: 07/28/2023 Medical Rec #:  [id number]       Height:       63.0 in Accession #:    [id number]      Weight:       133.8 lb Date of Birth:  04-05-1956        BSA:          1.630 m Patient Age:    67 years        BP:           100/69 mmHg Patient Gender: F               HR:           87 bpm. Exam Location:  Inpatient Procedure: 2D Echo, Cardiac Doppler and Color Doppler Indications:    Shock  History:        Patient has no prior history of Echocardiogram examinations.                 Risk Factors:Hypertension. ETOH, alcoholic cirrhosis.  Sonographer:    Milda Smart Referring Phys: [id number] JEAN-PIERRE ASSAKER  Sonographer Comments: Echo performed with patient supine and on artificial respirator. IMPRESSIONS  1. Left ventricular ejection fraction, by estimation, is 50 to 55%. The left ventricle has low normal function. Left ventricular endocardial border not optimally defined to evaluate regional wall motion. Left ventricular diastolic parameters are consistent with Grade I diastolic dysfunction (impaired relaxation).  2. Right ventricular systolic function is low normal. The  right ventricular size is normal.  3. The mitral valve is normal in structure. Trivial mitral valve regurgitation. No evidence of mitral stenosis.  4. The tricuspid valve is abnormal. Tricuspid valve regurgitation is mild to  moderate.  5. The aortic valve was not well visualized. Aortic valve regurgitation is not visualized. No aortic stenosis is present. Comparison(s): No prior Echocardiogram. FINDINGS  Left Ventricle: Left ventricular ejection fraction, by estimation, is 50 to 55%. The left ventricle has low normal function. Left ventricular endocardial border not optimally defined to evaluate regional wall motion. The left ventricular internal cavity  size was normal in size. There is no left ventricular hypertrophy. Left ventricular diastolic parameters are consistent with Grade I diastolic dysfunction (impaired relaxation). Normal left ventricular filling pressure. Right Ventricle: The right ventricular size is normal. No increase in right ventricular wall thickness. Right ventricular systolic function is low normal. Left Atrium: Left atrial size was normal in size. Right Atrium: Right atrial size was normal in size. Pericardium: There is no evidence of pericardial effusion. Mitral Valve: The mitral valve is normal in structure. Trivial mitral valve regurgitation. No evidence of mitral valve stenosis. MV peak gradient, 7.7 mmHg. The mean mitral valve gradient is 3.0 mmHg. Tricuspid Valve: The tricuspid valve is abnormal. Tricuspid valve regurgitation is mild to moderate. No evidence of tricuspid stenosis. Aortic Valve: The aortic valve was not well visualized. Aortic valve regurgitation is not visualized. No aortic stenosis is present. Pulmonic Valve: The pulmonic valve was not well visualized. Pulmonic valve regurgitation is not visualized. No evidence of pulmonic stenosis. Aorta: The aortic root and ascending aorta are structurally normal, with no evidence of dilitation. Venous: IVC assessment for right atrial pressure unable to be performed due to mechanical ventilation. IAS/Shunts: No atrial level shunt detected by color flow Doppler.  LEFT VENTRICLE PLAX 2D LVIDd:         4.10 cm     Diastology LVIDs:         3.10 cm     LV e'  medial:    5.98 cm/s LV PW:         0.80 cm     LV E/e' medial:  13.7 LV IVS:        0.80 cm     LV e' lateral:   6.85 cm/s LVOT diam:     2.10 cm     LV E/e' lateral: 11.9 LV SV:         51 LV SV Index:   31 LVOT Area:     3.46 cm  LV Volumes (MOD) LV vol d, MOD A2C: 35.8 ml LV vol d, MOD A4C: 70.3 ml LV vol s, MOD A2C: 18.2 ml LV vol s, MOD A4C: 33.0 ml LV SV MOD A2C:     17.6 ml LV SV MOD A4C:     70.3 ml LV SV MOD BP:      27.4 ml RIGHT VENTRICLE            IVC RV Basal diam:  3.20 cm    IVC diam: 1.20 cm RV S prime:     9.68 cm/s TAPSE (M-mode): 1.8 cm LEFT ATRIUM             Index        RIGHT ATRIUM           Index LA diam:        2.90 cm 1.78 cm/m   RA Area:     10.90 cm LA  Vol [facility]):   22.4 ml 13.74 ml/m  RA Volume:   22.40 ml  13.74 ml/m LA Vol (A4C):   34.5 ml 21.16 ml/m LA Biplane Vol: 32.4 ml 19.88 ml/m  AORTIC VALVE LVOT Vmax:   85.70 cm/s LVOT Vmean:  62.900 cm/s LVOT VTI:    0.146 m  AORTA Ao Root diam: 2.70 cm Ao Asc diam:  3.10 cm MITRAL VALVE                TRICUSPID VALVE MV Area (PHT): 2.71 cm     TR Peak grad:   33.2 mmHg MV Area VTI:   1.72 cm     TR Mean grad:   20.0 mmHg MV Peak grad:  7.7 mmHg     TR Vmax:        288.00 cm/s MV Mean grad:  3.0 mmHg     TR Vmean:       214.0 cm/s MV Vmax:       1.39 m/s MV Vmean:      87.4 cm/s    SHUNTS MV Decel Time: 280 msec     Systemic VTI:  0.15 m MR Peak grad: 38.9 mmHg     Systemic Diam: 2.10 cm MR Vmax:      312.00 cm/s MV E velocity: 81.80 cm/s MV A velocity: 110.00 cm/s MV E/A ratio:  0.74 Vishnu Priya Mallipeddi Electronically signed by Winfield Rast Mallipeddi Signature Date/Time: 07/28/2023/2:56:39 PM    Final    DG Abd 1 View  Result Date: 07/28/2023 CLINICAL DATA:  Orogastric tube placement.  Tube was advanced. EXAM: ABDOMEN - 1 VIEW COMPARISON:  Earlier today FINDINGS: Advancement of the enteric tube, the tip and side port are now below the diaphragm in the stomach. Nonobstructed upper abdominal bowel gas pattern. IMPRESSION:  Advancement of the enteric tube with tip and side port now below the diaphragm in the stomach. Electronically Signed   By: Narda Rutherford M.D.   On: 07/28/2023 12:14   DG Abd 1 View  Result Date: 07/28/2023 CLINICAL DATA:  Intubation EXAM: ABDOMEN - 1 VIEW COMPARISON:  Abdominal CT from earlier the same day FINDINGS: Enteric tube with tip at the stomach and side port at the GE junction. No gas dilated bowel. Femoral line on the right with tip near the right iliac and IVC junction. Cholecystectomy clips. IMPRESSION: Enteric tube with tip at the proximal stomach and side port near the GE junction, consider 5 to 10 cm of advancement. Unremarkable right femoral line. Electronically Signed   By: Tiburcio Pea M.D.   On: 07/28/2023 08:04   DG CHEST PORT 1 VIEW  Result Date: 07/28/2023 CLINICAL DATA:  Intubation EXAM: PORTABLE CHEST 1 VIEW COMPARISON:  07/28/2023 FINDINGS: Endotracheal tube with tip just below the clavicular heads. An enteric tube tip reaches the stomach with side port near the GE junction. Right IJ line with tip at the upper cavoatrial junction. Low volume chest with streaky and hazy density at the bases. No edema, effusion, or pneumothorax. Normal heart size and mediastinal contours. IMPRESSION: 1. Unremarkable endotracheal tube positioning. 2. Enteric tube with tip at the stomach and side port near the GE junction, consider few cm of advancement. 3. Atelectatic type density at the bases, mildly progressed. Electronically Signed   By: Tiburcio Pea M.D.   On: 07/28/2023 08:03   CT ABDOMEN PELVIS WO CONTRAST  Result Date: 07/28/2023 CLINICAL DATA:  67 year old female with history of sepsis. EXAM: CT ABDOMEN AND PELVIS WITHOUT CONTRAST  TECHNIQUE: Multidetector CT imaging of the abdomen and pelvis was performed following the standard protocol without IV contrast. RADIATION DOSE REDUCTION: This exam was performed according to the departmental dose-optimization program which includes  automated exposure control, adjustment of the mA and/or kV according to patient size and/or use of iterative reconstruction technique. COMPARISON:  CT of the abdomen and pelvis 02/25/2009. FINDINGS: Lower chest: Atherosclerotic calcifications are noted in the thoracic aorta as well as the left main, left anterior descending, left circumflex and right coronary arteries. Calcifications of the aortic valve. Central venous catheter terminating in the right atrium. Scattered areas of scarring and/or atelectasis in the lung bases bilaterally. Trace bilateral pleural effusions. Hepatobiliary: Liver has a shrunken appearance and nodular contour, indicative of underlying cirrhosis. There is also severe diffuse low attenuation throughout the hepatic parenchyma, indicative of a background of hepatic steatosis. No discrete cystic or solid hepatic lesions are confidently identified on today's noncontrast CT examination. Status post cholecystectomy. Pancreas: No definite pancreatic mass identified on today's noncontrast CT examination. Soft tissue stranding surrounding the pancreas could indicate acute pancreatitis. No well organized peripancreatic fluid collection noted to clearly indicate the presence of a pseudocyst. Spleen: Portable. Adrenals/Urinary Tract: Unenhanced appearance of the kidneys is unremarkable. No hydroureteronephrosis. Bilateral adrenal glands are normal in appearance. Urinary bladder is nearly completely decompressed around an indwelling Foley balloon catheter. Stomach/Bowel: The appearance of the stomach is normal. No pathologic dilatation of small bowel or colon. Numerous colonic diverticuli are noted, without surrounding inflammatory changes to suggest an acute diverticulitis at this time. Normal appendix. Vascular/Lymphatic: Atherosclerotic calcifications are noted in the abdominal aorta and pelvic vasculature. No lymphadenopathy noted in the abdomen or pelvis. Reproductive: Status post hysterectomy.  Right ovary is not confidently identified may be surgically absent or atrophic. Left ovary is unremarkable in appearance. Other: Trace volume of ascites.  No pneumoperitoneum. Musculoskeletal: There are no aggressive appearing lytic or blastic lesions noted in the visualized portions of the skeleton. IMPRESSION: 1. Soft tissue stranding surrounding the pancreas which may suggest acute pancreatitis. Correlation with lipase levels and clinical history is recommended. 2. Severe hepatic steatosis with morphologic changes in the liver indicative of underlying cirrhosis. 3. Trace volume of ascites and trace bilateral pleural effusions. 4. Colonic diverticulosis without evidence of acute diverticulitis at this time. 5. Aortic atherosclerosis, in addition to left main and three-vessel coronary artery disease. Please note that although the presence of coronary artery calcium documents the presence of coronary artery disease, the severity of this disease and any potential stenosis cannot be assessed on this non-gated CT examination. Assessment for potential risk factor modification, dietary therapy or pharmacologic therapy may be warranted, if clinically indicated. 6. Additional incidental findings, as above. Electronically Signed   By: Trudie Reed M.D.   On: 07/28/2023 06:27   DG CHEST PORT 1 VIEW  Result Date: 07/28/2023 CLINICAL DATA:  Central line placement EXAM: PORTABLE CHEST 1 VIEW COMPARISON:  07/27/2023 FINDINGS: Right central venous catheter with tip projecting over the cavoatrial junction. No pneumothorax. Shallow inspiration with linear atelectasis in the lung bases. No pleural effusions. No pneumothorax. Normal heart size. Calcification of the aorta. Thoracolumbar scoliosis. IMPRESSION: Right central venous catheter tip projects over the cavoatrial junction region. Shallow inspiration with linear atelectasis in the lung bases. Electronically Signed   By: Burman Nieves M.D.   On: 07/28/2023 02:51    DG Chest Port 1 View  Result Date: 07/27/2023 CLINICAL DATA:  sick.  Central line placement EXAM: PORTABLE CHEST 1 VIEW  COMPARISON:  None Available. FINDINGS: Right internal jugular central venous catheter tip overlying the right atrium. The heart and mediastinal contours are within normal limits. Atherosclerotic plaque. Bibasilar atelectasis. No focal consolidation. No pulmonary edema. No pleural effusion. No pneumothorax. No acute osseous abnormality.  Old healed left rib fractures. IMPRESSION: Right internal jugular central venous catheter tip overlying the right atrium. Recommend retraction by 2 cm. Electronically Signed   By: Tish Frederickson M.D.   On: 07/27/2023 23:39   CT Cervical Spine Wo Contrast  Result Date: 07/27/2023 CLINICAL DATA:  Fall EXAM: CT CERVICAL SPINE WITHOUT CONTRAST TECHNIQUE: Multidetector CT imaging of the cervical spine was performed without intravenous contrast. Multiplanar CT image reconstructions were also generated. RADIATION DOSE REDUCTION: This exam was performed according to the departmental dose-optimization program which includes automated exposure control, adjustment of the mA and/or kV according to patient size and/or use of iterative reconstruction technique. COMPARISON:  None Available. FINDINGS: Alignment: No traumatic listhesis.  Trace anterolisthesis C4 on C5. Skull base and vertebrae: No acute fracture. No primary bone lesion or focal pathologic process. Soft tissues and spinal canal: No prevertebral fluid or swelling. No visible canal hematoma. Left thyroid nodule measures up to 2.9 cm. Disc levels: Degenerative changes in the cervical spine. No high-grade spinal canal stenosis. Upper chest: No focal pulmonary opacity or pleural effusion. Emphysema. IMPRESSION: 1. No acute cervical spine fracture or traumatic listhesis. 2. Left thyroid nodule measuring up to 2.9 cm. If this has not previously been evaluated, a non-emergent ultrasound of the thyroid is  recommended. (Reference: J Am Coll Radiol. 2015 Feb;12(2): 143-50) 3. Emphysema. Emphysema (ICD10-J43.9). Electronically Signed   By: Wiliam Ke M.D.   On: 07/27/2023 21:58   CT HEAD CODE STROKE WO CONTRAST  Result Date: 07/27/2023 CLINICAL DATA:  Code stroke. Hypoglycemia, neuro deficit, weak for several days EXAM: CT HEAD WITHOUT CONTRAST TECHNIQUE: Contiguous axial images were obtained from the base of the skull through the vertex without intravenous contrast. RADIATION DOSE REDUCTION: This exam was performed according to the departmental dose-optimization program which includes automated exposure control, adjustment of the mA and/or kV according to patient size and/or use of iterative reconstruction technique. COMPARISON:  None Available. FINDINGS: Brain: No evidence of acute infarction, hemorrhage, mass, mass effect, or midline shift. No hydrocephalus or extra-axial collection. Periventricular white matter changes, likely the sequela of chronic small vessel ischemic disease. Vascular: No hyperdense vessel. Skull: Negative for fracture or focal lesion. Sinuses/Orbits: No acute finding. Other: The mastoid air cells are well aerated. ASPECTS [facility] Stroke Program Early CT Score) - Ganglionic level infarction (caudate, lentiform nuclei, internal capsule, insula, M1-M3 cortex): 7 - Supraganglionic infarction (M4-M6 cortex): 3 Total score (0-10 with 10 being normal): 10 IMPRESSION: 1. No acute intracranial process. 2. ASPECTS is 10. Code stroke imaging results were communicated on 07/27/2023 at 9:55 pm to provider Hyacinth Meeker via telephone, who verbally acknowledged these results. Electronically Signed   By: Wiliam Ke M.D.   On: 07/27/2023 21:55   Medications: Infusions:   prismasol BGK 4/2.5 400 mL/hr at 07/29/23 0001   feeding supplement (VITAL 1.5 CAL) 25 mL/hr at 07/29/23 0800   norepinephrine (LEVOPHED) Adult infusion 16 mcg/min (07/29/23 0800)   piperacillin-tazobactam (ZOSYN)  IV 12.5 mL/hr at  07/29/23 0800   potassium chloride 10 mEq (07/29/23 0810)   prismasol BGK 4/2.5 1,000 mL/hr at 07/28/23 2109   sodium bicarbonate 150 mEq in sterile water 1,150 mL infusion 125 mL/hr at 07/29/23 0800   vancomycin Stopped (07/28/23 2321)  vasopressin 0.03 Units/min (07/29/23 0800)    Scheduled Medications:  Chlorhexidine Gluconate Cloth  6 each Topical Daily   Chlorhexidine Gluconate Cloth  6 each Topical Daily   docusate  100 mg Per Tube BID   feeding supplement (PROSource TF20)  60 mL Per Tube BID   folic acid  1 mg Per Tube Daily   hydrocortisone sod succinate (SOLU-CORTEF) inj  100 mg Intravenous Q12H   lidocaine  10 mL Intradermal Once   multivitamin  1 tablet Per Tube QHS   mouth rinse  15 mL Mouth Rinse Q2H   pantoprazole (PROTONIX) IV  40 mg Intravenous Q24H   polyethylene glycol  17 g Per Tube Daily   sodium chloride flush  10-40 mL Intracatheter Q12H   thiamine  100 mg Oral Daily   Or   thiamine  100 mg Intravenous Daily   vancomycin variable dose per unstable renal function (pharmacist dosing)   Does not apply See admin instructions    have reviewed scheduled and prn medications.  Physical Exam: General:sedated on vent Heart:RRR Lungs: CBS bilat Abdomen: slight distention Extremities: pitting edema  Dialysis Access: fem HD cath placed 10/19    07/29/2023,8:36 AM  LOS: 2 days

## 2023-07-29 NOTE — Plan of Care (Signed)

## 2023-07-29 NOTE — Progress Notes (Signed)
NAME:  Carol Rowe, MRN:  [id number], DOB:  Mar 21, 1956, LOS: 2 ADMISSION DATE:  07/27/2023 CHIEF COMPLAINT:  Shock   History of Present Illness:  This is a case of a 67 year old female patient with a past medical history of alcohol use disorder, alcoholic liver cirrhosis, essential hypertension and presumed COPD presenting to [facility] as a transfer from [facility] for shock.  History was mainly taken from the patient at bedside and chart dissection.  She initially presented to [facility] on 10/18 after a family member went over to her house and found her down.  She apparently was walking to the bathroom and fell down and struck her head.  EMS were called by family members and on arrival she was found down hypoglycemic at the 30s.  She was given IV dextrose and transferred to [facility].  Last known normal 6 days ago.  In the ED she was found to be hypotensive, altered with left-sided focal deficits.  Therefore a code stroke was called and a stat CT head was obtained which was nonrevealing.  Neurology was consulted and felt less likely to be stroke as no focal weakness was present on their exam.  Felt this was secondary to metabolic encephalopathy.  Furthermore, labs were initially concerning for leukocytosis with a WBC count of 15.2.  Hyponatremia with a sodium of 122 and severe hypokalemia with a potassium of 2.0.  Also noted to have severe AKI with a creatinine of 6.84 no previous labs on file to determine baseline. Lactic acidosis also noted at 4.3. She was given 3 L normal saline and started on norepinephrine.  Blood culture was drawn and given broad-spectrum antibiotics.  Also central line was placed at [facility].  Chest x-ray with no focal consolidation.  Transferred here for further management.  On encounter. Patient A&O x3 to self time and place. 1L LR given and again noted to be hypoglycemic requiring another D50 push. She was on 12 mcg/min NE. A-line placed. Continue  on broad spec abx pending culture data. Remained Anuric, CT A/P pending.  Bedside echocardiogram with normal EF. RV not enlarge and no pericardial effusion. Repeat labs with normalized lactic acid.    Interval history:   No acute events overnight. Started to wake up and require PRN sedation yesterday, this morning required more.   Objective   Blood pressure 107/62, pulse 88, temperature 99 F (37.2 C), resp. rate (!) 22, height 5\' 3"  (1.6 m), weight 60.3 kg, SpO2 95%. CVP:  [9 mmHg] 9 mmHg  Vent Mode: PRVC FiO2 (%):  [40 %-50 %] 40 % Set Rate:  [22 bmp] 22 bmp Vt Set:  [410 mL] 410 mL PEEP:  [5 cmH20] 5 cmH20 Plateau Pressure:  [16 cmH20-23 cmH20] 16 cmH20 Intake/Output Summary (Last 24 hours) at 07/29/2023 1604 Last data filed at 07/29/2023 1500 Gross per 24 hour  Intake 1934.68 ml  Output 2212 ml  Net -277.32 ml   Filed Weights   07/27/23 2117 07/28/23 0500 07/29/23 0500  Weight: 55.8 kg 60.7 kg 60.3 kg    Examination: General: critically ill appearing woman lying in bed in NAD HENT: /AT, eyes anicteric Lungs: synchronous with vent, mild sputum Cardiovascular: S1S2, RRR Abdomen: soft, distended, hypoactive bowel sounds Extremities: + edema, no cyanosis, femoral HD catheter Derm:  jaundiced Neuro: RASS -4, pupils reactive, + cough reflex. On dilaudid infusion.   7.38/46/86/28 Na+  129 K+ 3.2 Bicarb 23 BUN 17 Cr 3.01 Phos 3.6 BNP 1385 WBC  14.3 H/H 9.3/27.2 Platelets 124  Blood culture: NG x 2 days  CXR personally reviewed> RLL infiltrate, bibasilar effusions silhouetting lateral hemidiaphragms.  Echo: LVEF 50-55%, G1DD. Trivial MR. Mild to moderate TR. Low normal RV function.     Assessment & Plan:  Septic shock, Likely RLL pneumonia. Blood cultures remain negative.  -con't broad spectrum antibiotics- zosyn, vanc -vasopressors PRN to maintain MAP >65; NE requirements slowly improving -follow blood cultures until finalized; collect trach aspirate  culture -con't stress dose steroids  Anemia; stable -transfuse for Hb <7 or hemodynamically significantly bleeding -monitor  BRBpR last night, Hb stable -hold chemical DVT prophylaxis -unless ongoing bleeding hold on GI consult for now.  Does not appear to be brisk bleeding. Can rechallenge with Dvt prophylaxis tomorrow.   Thrombocytopenia- unclear if due to sepsis, antibiotics, less likely cirrhosis since it's acute -monitor, no current indication for transfusion.  AKI, oliguric Hyponatremia hypokalemia AGMA; worsening Hyperphosphatemia -CRRT per nephrology -strict I/O -renally dose meds, avoid nephrotoxic meds  Hypocalcemia -starting Tums  Acute respiratory failure with hypoxia requiring MV -LTVV -VAP prevention protocol -PAD protocol for sedation -daily SAT & SBT as appropriate; weaning FiO2 down today  H/o ETOH abuse Cirrhosis, acutely decompensated. MELD-Na+ 29 Mild coagulopathy Mild hyperammoniemia -vitamins -TF -start lactulose TID  Hypoglycemia, resolved, now hyperglycemic -con't accuchecks, not requiring insulin -goal BG <180  Moderate protein energy malnutrition -TF, vitamins  Son Will Ruehl called again today- no answer, left a message.   Spoke to him yesterday-- she has been requiring 3 types of life support- the vent, pressors, CRRT. He confirmed she would want continued aggressive care.    Best Practice (right click and "Reselect all SmartList Selections" daily)   Diet/type: tubefeeds DVT prophylaxis: SCD GI prophylaxis: PPI Lines: Central line Foley:  Yes, and it is still needed Code Status:  full code     This patient is critically ill with multiple organ system failure which requires frequent high complexity decision making, assessment, support, evaluation, and titration of therapies. This was completed through the application of advanced monitoring technologies and extensive interpretation of multiple databases. During this encounter  critical care time was devoted to patient care services described in this note for 39 minutes.  Steffanie Dunn, DO 07/29/23 4:25 PM [facility]  For contact information, see Amion. If no response to pager, please call PCCM consult pager. After hours, 7PM- 7AM, please call Elink.

## 2023-07-30 DIAGNOSIS — R579 Shock, unspecified: Secondary | ICD-10-CM | POA: Diagnosis not present

## 2023-07-30 LAB — POCT I-STAT 7, (LYTES, BLD GAS, ICA,H+H)
Acid-Base Excess: 1 mmol/L (ref 0.0–2.0)
Acid-base deficit: 1 mmol/L (ref 0.0–2.0)
Bicarbonate: 23.8 mmol/L (ref 20.0–28.0)
Bicarbonate: 25.2 mmol/L (ref 20.0–28.0)
Calcium, Ion: 1.04 mmol/L — ABNORMAL LOW (ref 1.15–1.40)
Calcium, Ion: 1.08 mmol/L — ABNORMAL LOW (ref 1.15–1.40)
HCT: 31 % — ABNORMAL LOW (ref 36.0–46.0)
HCT: 32 % — ABNORMAL LOW (ref 36.0–46.0)
Hemoglobin: 10.5 g/dL — ABNORMAL LOW (ref 12.0–15.0)
Hemoglobin: 10.9 g/dL — ABNORMAL LOW (ref 12.0–15.0)
O2 Saturation: 88 %
O2 Saturation: 98 %
Patient temperature: 38.2
Potassium: 3.8 mmol/L (ref 3.5–5.1)
Potassium: 3.9 mmol/L (ref 3.5–5.1)
Sodium: 135 mmol/L (ref 135–145)
Sodium: 135 mmol/L (ref 135–145)
TCO2: 25 mmol/L (ref 22–32)
TCO2: 26 mmol/L (ref 22–32)
pCO2 arterial: 36.9 mm[Hg] (ref 32–48)
pCO2 arterial: 37.8 mm[Hg] (ref 32–48)
pH, Arterial: 7.416 (ref 7.35–7.45)
pH, Arterial: 7.436 (ref 7.35–7.45)
pO2, Arterial: 53 mm[Hg] — ABNORMAL LOW (ref 83–108)
pO2, Arterial: 100 mm[Hg] (ref 83–108)

## 2023-07-30 LAB — RENAL FUNCTION PANEL
Albumin: 2.1 g/dL — ABNORMAL LOW (ref 3.5–5.0)
Albumin: 2.2 g/dL — ABNORMAL LOW (ref 3.5–5.0)
Anion gap: 12 (ref 5–15)
Anion gap: 11 (ref 5–15)
BUN: 10 mg/dL (ref 8–23)
BUN: 9 mg/dL (ref 8–23)
CO2: 24 mmol/L (ref 22–32)
CO2: 23 mmol/L (ref 22–32)
Calcium: 7.9 mg/dL — ABNORMAL LOW (ref 8.9–10.3)
Calcium: 7.8 mg/dL — ABNORMAL LOW (ref 8.9–10.3)
Chloride: 96 mmol/L — ABNORMAL LOW (ref 98–111)
Chloride: 101 mmol/L (ref 98–111)
Creatinine, Ser: 1.23 mg/dL — ABNORMAL HIGH (ref 0.44–1.00)
Creatinine, Ser: 1.27 mg/dL — ABNORMAL HIGH (ref 0.44–1.00)
GFR, Estimated: 49 mL/min — ABNORMAL LOW (ref >60)
GFR, Estimated: 47 mL/min — ABNORMAL LOW (ref >60)
Glucose, Bld: 187 mg/dL — ABNORMAL HIGH (ref 70–99)
Glucose, Bld: 167 mg/dL — ABNORMAL HIGH (ref 70–99)
Phosphorus: 1.4 mg/dL — ABNORMAL LOW (ref 2.5–4.6)
Phosphorus: 2.8 mg/dL (ref 2.5–4.6)
Potassium: 4 mmol/L (ref 3.5–5.1)
Potassium: 3.9 mmol/L (ref 3.5–5.1)
Sodium: 132 mmol/L — ABNORMAL LOW (ref 135–145)
Sodium: 135 mmol/L (ref 135–145)

## 2023-07-30 LAB — MAGNESIUM: Magnesium: 2.2 mg/dL (ref 1.7–2.4)

## 2023-07-30 LAB — HEMOGLOBIN A1C
Hgb A1c MFr Bld: 5.1 % (ref 4.8–5.6)
Mean Plasma Glucose: 99.67 mg/dL

## 2023-07-30 LAB — CBC
HCT: 28.6 % — ABNORMAL LOW (ref 36.0–46.0)
Hemoglobin: 9.3 g/dL — ABNORMAL LOW (ref 12.0–15.0)
MCH: 33 pg (ref 26.0–34.0)
MCHC: 32.5 g/dL (ref 30.0–36.0)
MCV: 101.4 fL — ABNORMAL HIGH (ref 80.0–100.0)
Platelets: 97 10*3/uL — ABNORMAL LOW (ref 150–400)
RBC: 2.82 MIL/uL — ABNORMAL LOW (ref 3.87–5.11)
RDW: 17.2 % — ABNORMAL HIGH (ref 11.5–15.5)
WBC: 31.8 10*3/uL — ABNORMAL HIGH (ref 4.0–10.5)
nRBC: 0.1 % (ref 0.0–0.2)

## 2023-07-30 LAB — GLUCOSE, CAPILLARY
Glucose-Capillary: 181 mg/dL — ABNORMAL HIGH (ref 70–99)
Glucose-Capillary: 177 mg/dL — ABNORMAL HIGH (ref 70–99)
Glucose-Capillary: 193 mg/dL — ABNORMAL HIGH (ref 70–99)
Glucose-Capillary: 171 mg/dL — ABNORMAL HIGH (ref 70–99)
Glucose-Capillary: 133 mg/dL — ABNORMAL HIGH (ref 70–99)
Glucose-Capillary: 131 mg/dL — ABNORMAL HIGH (ref 70–99)

## 2023-07-30 LAB — TRIGLYCERIDES: Triglycerides: 279 mg/dL — ABNORMAL HIGH (ref <150)

## 2023-07-30 LAB — PHOSPHORUS: Phosphorus: 2 mg/dL — ABNORMAL LOW (ref 2.5–4.6)

## 2023-07-30 MED ORDER — FENTANYL 2500MCG IN NS 250ML (10MCG/ML) PREMIX INFUSION
25.0000 ug/h | INTRAVENOUS | Status: DC
Start: 2023-07-30 — End: 2023-07-31
  Administered 2023-07-30: 25 ug/h via INTRAVENOUS
  Filled 2023-07-30: qty 250

## 2023-07-30 MED ORDER — PREDNISONE 20 MG PO TABS: 20.0000 mg | ORAL_TABLET | Freq: Every day | ORAL | Status: DC

## 2023-07-30 MED ORDER — PREDNISONE 20 MG PO TABS: 30.0000 mg | ORAL_TABLET | Freq: Every day | ORAL | Status: DC

## 2023-07-30 MED ORDER — INSULIN ASPART 100 UNIT/ML IJ SOLN
0.0000 [IU] | INTRAMUSCULAR | Status: DC
  Administered 2023-07-30: 3 [IU] via SUBCUTANEOUS
  Administered 2023-07-30 – 2023-08-06 (×8): 2 [IU] via SUBCUTANEOUS

## 2023-07-30 MED ORDER — HYDROCORTISONE SOD SUC (PF) 100 MG IJ SOLR
50.0000 mg | Freq: Two times a day (BID) | INTRAMUSCULAR | Status: AC
  Administered 2023-07-30 – 2023-08-01 (×5): 50 mg via INTRAVENOUS
  Filled 2023-07-30 (×4): qty 2

## 2023-07-30 MED ORDER — FENTANYL BOLUS VIA INFUSION: 50.0000 ug | INTRAVENOUS | Status: DC | PRN

## 2023-07-30 MED ORDER — SODIUM PHOSPHATES 45 MMOLE/15ML IV SOLN
45.0000 mmol | Freq: Once | INTRAVENOUS | Status: DC
  Filled 2023-07-30: qty 15

## 2023-07-30 MED ORDER — PREDNISONE 20 MG PO TABS
40.0000 mg | ORAL_TABLET | Freq: Every day | ORAL | Status: AC
  Filled 2023-07-30: qty 2

## 2023-07-30 MED ORDER — FENTANYL BOLUS VIA INFUSION: 25.0000 ug | INTRAVENOUS | Status: DC | PRN

## 2023-07-30 MED ORDER — FENTANYL 2500MCG IN NS 250ML (10MCG/ML) PREMIX INFUSION: 0.0000 ug/h | INTRAVENOUS | Status: DC

## 2023-07-30 MED ORDER — FENTANYL CITRATE PF 50 MCG/ML IJ SOSY
25.0000 ug | PREFILLED_SYRINGE | Freq: Once | INTRAMUSCULAR | Status: AC
  Administered 2023-07-30: 25 ug via INTRAVENOUS
  Filled 2023-07-30: qty 1

## 2023-07-30 MED ORDER — PREDNISONE 20 MG PO TABS: 10.0000 mg | ORAL_TABLET | Freq: Every day | ORAL | Status: DC

## 2023-07-30 MED ORDER — SODIUM PHOSPHATES 45 MMOLE/15ML IV SOLN
30.0000 mmol | Freq: Once | INTRAVENOUS | Status: AC
  Administered 2023-07-30: 30 mmol via INTRAVENOUS
  Filled 2023-07-30: qty 10

## 2023-07-30 MED ORDER — PIPERACILLIN-TAZOBACTAM 3.375 G IVPB
3.3750 g | Freq: Four times a day (QID) | INTRAVENOUS | Status: DC
  Administered 2023-07-30 – 2023-08-01 (×7): 3.375 g via INTRAVENOUS
  Filled 2023-07-30 (×9): qty 50

## 2023-07-30 NOTE — TOC Initial Note (Signed)
Transition of Care [facility]) - Initial/Assessment Note    Patient Details  Name: Carol Rowe MRN: [id number] Date of Birth: [birth year]  Transition of Care [facility] CM/SW Contact:    Elliot Cousin, RN Phone Number: [phone] 07/30/2023, 5:41 PM  Clinical Narrative:  CM spoke to pt's son and states pt was independent pta. Son states patient lives alone, Will need PT/OT evaluation when pt is medically ready.                  Expected Discharge Plan: IP Rehab Facility Barriers to Discharge: Continued Medical Work up   Patient Goals and CMS Choice            Expected Discharge Plan and Services   Discharge Planning Services: CM Consult   Living arrangements for the past 2 months: Single Family Home                                      Prior Living Arrangements/Services Living arrangements for the past 2 months: Single Family Home Lives with:: Self Patient language and need for interpreter reviewed:: Yes                 Activities of Daily Living      Permission Sought/Granted Permission sought to share information with : Case Manager, Family Supports Permission granted to share information with : Yes, Verbal Permission Granted  Share Information with NAME: Will Cullinan     Permission granted to share info w Relationship: son  Permission granted to share info w Contact Information: [phone]  Emotional Assessment   Attitude/Demeanor/Rapport: Intubated (Following Commands or Not Following Commands), Other (comment)          Admission diagnosis:  Hypokalemia [E87.6] Anuria [R34] Hyponatremia [E87.1] Shock (HCC) [R57.9] Weakness of left arm [R29.898] Acute encephalopathy [G93.40] Hypothermia, initial encounter [T68.XXXA] Acute renal failure, unspecified acute renal failure type (HCC) [N17.9] Septic shock (HCC) [A41.9, R65.21] Patient Active Problem List   Diagnosis Date Noted   Septic shock (HCC) 07/28/2023   Shock (HCC) 07/27/2023    Special screening for malignant neoplasms, colon 09/29/2019   Family hx of colon cancer 09/29/2019   Leukocytosis 09/03/2017   Thrombocytosis 09/03/2017   Chest tightness or pressure 08/29/2012   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   CONSTIPATION, CHRONIC 01/05/2009   NAUSEA, CHRONIC 01/05/2009   EPIGASTRIC PAIN 01/05/2009   MELANOMA 01/04/2009   ANXIETY 01/04/2009   CIGARETTE SMOKER 01/04/2009   DEPRESSION 01/04/2009   HYPERTENSION 01/04/2009   GERD 01/04/2009   ARTHRITIS 01/04/2009   OSTEOPOROSIS 01/04/2009   DIVERTICULITIS, HX OF 01/04/2009   PCP:  Benita Stabile, MD Pharmacy:   [facility] [place] [place] - [address] [place] [address] Phone: [phone] Fax: [phone]     Social Determinants of Health (SDOH) Social History: SDOH Screenings   Tobacco Use: High Risk (07/27/2023)   SDOH Interventions:     Readmission Risk Interventions     No data to display

## 2023-07-30 NOTE — Progress Notes (Signed)
NAME:  Carol Rowe, MRN:  [id number], DOB:  03-22-1956, LOS: 3 ADMISSION DATE:  07/27/2023 CHIEF COMPLAINT:  Shock   History of Present Illness:  This is a case of a 67 year old female patient with a past medical history of alcohol use disorder, alcoholic liver cirrhosis, essential hypertension and presumed COPD presenting to [facility] as a transfer from [facility] for shock.  History was mainly taken from the patient at bedside and chart dissection.  She initially presented to [facility] on 10/18 after a family member went over to her house and found her down.  She apparently was walking to the bathroom and fell down and struck her head.  EMS were called by family members and on arrival she was found down hypoglycemic at the 30s.  She was given IV dextrose and transferred to [facility].  Last known normal 6 days ago.  In the ED she was found to be hypotensive, altered with left-sided focal deficits.  Therefore a code stroke was called and a stat CT head was obtained which was nonrevealing.  Neurology was consulted and felt less likely to be stroke as no focal weakness was present on their exam.  Felt this was secondary to metabolic encephalopathy.  Furthermore, labs were initially concerning for leukocytosis with a WBC count of 15.2.  Hyponatremia with a sodium of 122 and severe hypokalemia with a potassium of 2.0.  Also noted to have severe AKI with a creatinine of 6.84 no previous labs on file to determine baseline. Lactic acidosis also noted at 4.3. She was given 3 L normal saline and started on norepinephrine.  Blood culture was drawn and given broad-spectrum antibiotics.  Also central line was placed at [facility].  Chest x-ray with no focal consolidation.  Transferred here for further management.  On encounter. Patient A&O x3 to self time and place. 1L LR given and again noted to be hypoglycemic requiring another D50 push. She was on 12 mcg/min NE. A-line placed. Continue  on broad spec abx pending culture data. Remained Anuric, CT A/P pending.  Bedside echocardiogram with normal EF. RV not enlarge and no pericardial effusion. Repeat labs with normalized lactic acid.    Interval history:    Patient remains critically ill, on mechanical support.  Remains on vasopressors and CVVHD.  Objective   Blood pressure 115/75, pulse (!) 101, temperature 98.5 F (36.9 C), temperature source Oral, resp. rate 17, height 5\' 3"  (1.6 m), weight 60.1 kg, SpO2 95%. CVP:  [7 mmHg-8 mmHg] 7 mmHg  Vent Mode: PSV;CPAP FiO2 (%):  [40 %-50 %] 40 % Set Rate:  [22 bmp] 22 bmp Vt Set:  [410 mL] 410 mL PEEP:  [5 cmH20] 5 cmH20 Pressure Support:  [5 cmH20-10 cmH20] 10 cmH20 Plateau Pressure:  [15 cmH20-18 cmH20] 15 cmH20 Intake/Output Summary (Last 24 hours) at 07/30/2023 1038 Last data filed at 07/30/2023 1000 Gross per 24 hour  Intake 2474.74 ml  Output 3593.4 ml  Net -1118.66 ml   Filed Weights   07/28/23 0500 07/29/23 0500 07/30/23 0422  Weight: 60.7 kg 60.3 kg 60.1 kg    Examination: General: Patient remains critically ill intubated on mechanical life support HENT: NCAT, endotracheal tube in place Lungs: Bilateral mechanically ventilated breath sounds Cardiovascular: Regular rate rhythm, S1-S2 Abdomen: Soft, nondistended Extremities: Bilateral trace edema, femoral HD catheter Derm: Skin and eyes slightly jaundiced Neuro: Sedated on mechanical support  Serum creatinine improving  Chest x-ray from yesterday reviewed, right lower lobe infiltrate  Echo: LVEF 50-55%, G1DD. Trivial MR. Mild to moderate TR. Low normal RV function.     Assessment & Plan:   Septic shock, Likely RLL pneumonia. Blood cultures remain negative.  Plan: Continue to monitor white blood cell count, remains elevated likely due to steroids Continue Zosyn, stop vancomycin, MRSA PCR negative.  Anemia; stable H&H stable, no signs of significant bleeding  BRBpR last night, Hb stable -hold  chemical DVT prophylaxis -unless ongoing bleeding hold on GI consult for now.  Does not appear to be brisk bleeding. Can rechallenge with Dvt prophylaxis tomorrow.   Thrombocytopenia- unclear if due to sepsis, antibiotics, less likely cirrhosis since it's acute -monitor, no current indication for transfusion.  AKI, oliguric Hyponatremia hypokalemia AGMA; worsening Hypophosphatemia - repleting  -CRRT per nephrology -strict I/O -renally dose meds, avoid nephrotoxic meds  Hypocalcemia -starting Tums  Acute respiratory failure with hypoxia requiring MV -LTVV -VAP prevention protocol -PAD protocol for sedation -daily SAT & SBT as appropriate; weaning FiO2 down today  H/o ETOH abuse Cirrhosis, acutely decompensated. MELD-Na+ 29 Mild coagulopathy Mild hyperammonemia -multivitamins, folate thiamine   Hypoglycemia, resolved, now hyperglycemic -con't accuchecks, not requiring insulin -goal BG <180  Moderate protein energy malnutrition -TF, vitamins  I attempted to call son at number provided in the chart this morning. There was no answer.    Best Practice (right click and "Reselect all SmartList Selections" daily)   Diet/type: tubefeeds DVT prophylaxis: SCD GI prophylaxis: PPI Lines: Central line Foley:  Yes, and it is still needed Code Status:  full code   This patient is critically ill with multiple organ system failure; which, requires frequent high complexity decision making, assessment, support, evaluation, and titration of therapies. This was completed through the application of advanced monitoring technologies and extensive interpretation of multiple databases. During this encounter critical care time was devoted to patient care services described in this note for 32 minutes.  Josephine Igo, DO [facility] 07/30/2023 10:41 AM

## 2023-07-30 NOTE — Progress Notes (Signed)
Subjective:  CRRT running well-  still pressor req with minimal UOP (Levo 24m Vaso 0.03)   Objective Vital signs in last 24 hours: Vitals:   07/30/23 1045 07/30/23 1100 07/30/23 1121 07/30/23 1124  BP:  115/72    Pulse: (!) 107 (!) 107    Resp: (!) 22 (!) 22    Temp:    98.5 F (36.9 C)  TempSrc:    Oral  SpO2: 95% 95% 91%   Weight:      Height:       Weight change: -0.2 kg  Intake/Output Summary (Last 24 hours) at 07/30/2023 1143 Last data filed at 07/30/2023 1100 Gross per 24 hour  Intake 2495.64 ml  Output 3633.8 ml  Net -1138.16 ml    Assessment/Plan: 67 year old WF with cirrhosis, COPD found down.  Unknown renal function baseline but now oligo/anuric AKI 1.Renal- oligoanuric AKI -  likely due to profound hypotension on an ACE , mild rhabdo with ck of over 500.  Made a little urine, 100 of protein but mostly c/w UTI -  imaging does not indicate hydro.  Due to acidosis needed to support with CRRT -  started on 10/19  Seen on CRRT rt fem temp All 4K baths on Levophed and Vaso CVP 7  Keep even to 50 ml/hr for now   No e/o renal recovery at this time.  2. Hypertension/volume  - appears to have septic shock although no source identified, possibly UTI -  no obvious abnormalities on bedside echo-  pressors/zosyn and vanc per CCM-  CVP only 7 now, tolerating gentle UF of only 50 3. Hypokalemia-  found down, poor nutrition-  all 4  k bath with CRRT 4. Metabolic acidosis-  corrected with CRRT  4. Anemia  - could be due to GIB, CKD-  interestingly went from 8's to 10's without intervention 5. Elytes-  better on CRRT (all 4K baths)      [name]    Labs: Basic Metabolic Panel: Recent Labs  Lab 07/29/23 0340 07/29/23 0859 07/29/23 1511 07/30/23 0301  NA 129*  130* 127* 130* 132*  K 3.2*  3.2* 4.1 4.4 4.0  CL 90*  90*  --  96* 96*  CO2 23  23  --  23 24  GLUCOSE 125*  125*  --  166* 187*  BUN 17  17  --  13 10  CREATININE 3.01*  2.98*  --  2.02* 1.23*   CALCIUM 7.1*  7.1*  --  7.2* 7.9*  PHOS 3.6  --  2.0* 1.4*   Liver Function Tests: Recent Labs  Lab 07/27/23 2201 07/28/23 1508 07/29/23 0340 07/29/23 1511 07/30/23 0301  AST 67*  --   --   --   --   ALT 32  --   --   --   --   ALKPHOS 208*  --   --   --   --   BILITOT 1.0  --   --   --   --   PROT 6.3*  --   --   --   --   ALBUMIN 3.2*   < > 2.2* 2.1* 2.1*   < > = values in this interval not displayed.   No results for input(s): "LIPASE", "AMYLASE" in the last 168 hours. Recent Labs  Lab 07/28/23 1725  AMMONIA 50*   CBC: Recent Labs  Lab 07/27/23 2201 07/28/23 0234 07/28/23 0345 07/28/23 1855 07/29/23 0340 07/29/23 3244 07/30/23 0301  WBC 15.2* 9.2  --  14.7* 14.3*  --  31.8*  NEUTROABS 14.3*  --   --   --   --   --   --   HGB 8.8* 8.6*   < > 9.2* 9.3* 9.5* 9.3*  HCT 27.3* 25.7*   < > 27.5* 27.2* 28.0* 28.6*  MCV 103.0* 97.7  --  96.8 96.5  --  101.4*  PLT 227 194  --  150 124*  --  97*   < > = values in this interval not displayed.   Cardiac Enzymes: Recent Labs  Lab 07/27/23 2201  CKTOTAL 519*   CBG: Recent Labs  Lab 07/29/23 1514 07/29/23 1936 07/29/23 2304 07/30/23 0329 07/30/23 0759  GLUCAP 174* 187* 190* 181* 177*    Iron Studies: No results for input(s): "IRON", "TIBC", "TRANSFERRIN", "FERRITIN" in the last 72 hours. Studies/Results: DG CHEST PORT 1 VIEW  Result Date: 07/29/2023 CLINICAL DATA:  Hypoxia.  Hypoglycemia.  Hypotension.  Fall. EXAM: PORTABLE CHEST 1 VIEW COMPARISON:  07/28/2023 FINDINGS: Endotracheal tube, enteric tube, and right central venous catheter are unchanged in position. Linear atelectasis or consolidation demonstrated in both lung bases, progressing since prior study. Emphysematous changes and scattered fibrosis in the lungs. No pneumothorax. Thoracolumbar scoliosis convex towards the right. IMPRESSION: Appliances remain unchanged in satisfactory position. Infiltration or atelectasis in the lung bases with progression  since prior study. Electronically Signed   By: Burman Nieves M.D.   On: 07/29/2023 00:40   ECHOCARDIOGRAM COMPLETE  Result Date: 07/28/2023    ECHOCARDIOGRAM REPORT   Patient Name:   Carol Rowe Date of Exam: 07/28/2023 Medical Rec #:  [id number]       Height:       63.0 in Accession #:    [id number]      Weight:       133.8 lb Date of Birth:  May 30, 1956        BSA:          1.630 m Patient Age:    67 years        BP:           100/69 mmHg Patient Gender: F               HR:           87 bpm. Exam Location:  Inpatient Procedure: 2D Echo, Cardiac Doppler and Color Doppler Indications:    Shock  History:        Patient has no prior history of Echocardiogram examinations.                 Risk Factors:Hypertension. ETOH, alcoholic cirrhosis.  Sonographer:    Milda Smart Referring Phys: [id number] JEAN-PIERRE ASSAKER  Sonographer Comments: Echo performed with patient supine and on artificial respirator. IMPRESSIONS  1. Left ventricular ejection fraction, by estimation, is 50 to 55%. The left ventricle has low normal function. Left ventricular endocardial border not optimally defined to evaluate regional wall motion. Left ventricular diastolic parameters are consistent with Grade I diastolic dysfunction (impaired relaxation).  2. Right ventricular systolic function is low normal. The right ventricular size is normal.  3. The mitral valve is normal in structure. Trivial mitral valve regurgitation. No evidence of mitral stenosis.  4. The tricuspid valve is abnormal. Tricuspid valve regurgitation is mild to moderate.  5. The aortic valve was not well visualized. Aortic valve regurgitation is not visualized. No aortic stenosis is present. Comparison(s): No prior Echocardiogram. FINDINGS  Left Ventricle:  Left ventricular ejection fraction, by estimation, is 50 to 55%. The left ventricle has low normal function. Left ventricular endocardial border not optimally defined to evaluate regional wall motion. The left  ventricular internal cavity  size was normal in size. There is no left ventricular hypertrophy. Left ventricular diastolic parameters are consistent with Grade I diastolic dysfunction (impaired relaxation). Normal left ventricular filling pressure. Right Ventricle: The right ventricular size is normal. No increase in right ventricular wall thickness. Right ventricular systolic function is low normal. Left Atrium: Left atrial size was normal in size. Right Atrium: Right atrial size was normal in size. Pericardium: There is no evidence of pericardial effusion. Mitral Valve: The mitral valve is normal in structure. Trivial mitral valve regurgitation. No evidence of mitral valve stenosis. MV peak gradient, 7.7 mmHg. The mean mitral valve gradient is 3.0 mmHg. Tricuspid Valve: The tricuspid valve is abnormal. Tricuspid valve regurgitation is mild to moderate. No evidence of tricuspid stenosis. Aortic Valve: The aortic valve was not well visualized. Aortic valve regurgitation is not visualized. No aortic stenosis is present. Pulmonic Valve: The pulmonic valve was not well visualized. Pulmonic valve regurgitation is not visualized. No evidence of pulmonic stenosis. Aorta: The aortic root and ascending aorta are structurally normal, with no evidence of dilitation. Venous: IVC assessment for right atrial pressure unable to be performed due to mechanical ventilation. IAS/Shunts: No atrial level shunt detected by color flow Doppler.  LEFT VENTRICLE PLAX 2D LVIDd:         4.10 cm     Diastology LVIDs:         3.10 cm     LV e' medial:    5.98 cm/s LV PW:         0.80 cm     LV E/e' medial:  13.7 LV IVS:        0.80 cm     LV e' lateral:   6.85 cm/s LVOT diam:     2.10 cm     LV E/e' lateral: 11.9 LV SV:         51 LV SV Index:   31 LVOT Area:     3.46 cm  LV Volumes (MOD) LV vol d, MOD A2C: 35.8 ml LV vol d, MOD A4C: 70.3 ml LV vol s, MOD A2C: 18.2 ml LV vol s, MOD A4C: 33.0 ml LV SV MOD A2C:     17.6 ml LV SV MOD A4C:      70.3 ml LV SV MOD BP:      27.4 ml RIGHT VENTRICLE            IVC RV Basal diam:  3.20 cm    IVC diam: 1.20 cm RV S prime:     9.68 cm/s TAPSE (M-mode): 1.8 cm LEFT ATRIUM             Index        RIGHT ATRIUM           Index LA diam:        2.90 cm 1.78 cm/m   RA Area:     10.90 cm LA Vol (A2C):   22.4 ml 13.74 ml/m  RA Volume:   22.40 ml  13.74 ml/m LA Vol (A4C):   34.5 ml 21.16 ml/m LA Biplane Vol: 32.4 ml 19.88 ml/m  AORTIC VALVE LVOT Vmax:   85.70 cm/s LVOT Vmean:  62.900 cm/s LVOT VTI:    0.146 m  AORTA Ao Root diam: 2.70 cm Ao Asc diam:  3.10 cm MITRAL VALVE                TRICUSPID VALVE MV Area (PHT): 2.71 cm     TR Peak grad:   33.2 mmHg MV Area VTI:   1.72 cm     TR Mean grad:   20.0 mmHg MV Peak grad:  7.7 mmHg     TR Vmax:        288.00 cm/s MV Mean grad:  3.0 mmHg     TR Vmean:       214.0 cm/s MV Vmax:       1.39 m/s MV Vmean:      87.4 cm/s    SHUNTS MV Decel Time: 280 msec     Systemic VTI:  0.15 m MR Peak grad: 38.9 mmHg     Systemic Diam: 2.10 cm MR Vmax:      312.00 cm/s MV E velocity: 81.80 cm/s MV A velocity: 110.00 cm/s MV E/A ratio:  0.74 Vishnu Priya Mallipeddi Electronically signed by Winfield Rast Mallipeddi Signature Date/Time: 07/28/2023/2:56:39 PM    Final    Medications: Infusions:   prismasol BGK 4/2.5 400 mL/hr at 07/30/23 1021    prismasol BGK 4/2.5 400 mL/hr at 07/30/23 1025   feeding supplement (VITAL 1.5 CAL) 45 mL/hr at 07/30/23 1100   norepinephrine (LEVOPHED) Adult infusion 10 mcg/min (07/30/23 1100)   piperacillin-tazobactam     prismasol BGK 4/2.5 1,000 mL/hr at 07/30/23 0750   propofol (DIPRIVAN) infusion 35 mcg/kg/min (07/30/23 1100)   sodium phosphate 30 mmol in dextrose 5 % 250 mL infusion 43 mL/hr at 07/30/23 1100   vancomycin Stopped (07/30/23 0008)   vasopressin 0.03 Units/min (07/30/23 1100)    Scheduled Medications:  calcium carbonate  1 tablet Per Tube BID   Chlorhexidine Gluconate Cloth  6 each Topical Daily   Chlorhexidine Gluconate Cloth   6 each Topical Daily   docusate  100 mg Per Tube BID   feeding supplement (PROSource TF20)  60 mL Per Tube BID   folic acid  1 mg Per Tube Daily   hydrocortisone sod succinate (SOLU-CORTEF) inj  50 mg Intravenous Q12H   Followed by   Melene Muller ON 08/02/2023] predniSONE  40 mg Oral Q breakfast   Followed by   Melene Muller ON 08/03/2023] predniSONE  30 mg Oral Q breakfast   Followed by   Melene Muller ON 08/04/2023] predniSONE  20 mg Oral Q breakfast   Followed by   Melene Muller ON 08/05/2023] predniSONE  10 mg Oral Q breakfast   lactulose  30 g Per Tube TID   lidocaine  10 mL Intradermal Once   multivitamin  1 tablet Per Tube QHS   mouth rinse  15 mL Mouth Rinse Q2H   pantoprazole (PROTONIX) IV  40 mg Intravenous Q24H   polyethylene glycol  17 g Per Tube Daily   sodium chloride flush  10-40 mL Intracatheter Q12H   thiamine  100 mg Oral Daily   Or   thiamine  100 mg Intravenous Daily    have reviewed scheduled and prn medications.  Physical Exam: General:sedated on vent Heart:RRR Lungs: CBS bilat Abdomen: slight distention Extremities: pitting edema  Dialysis Access: fem HD cath placed 10/19    07/30/2023,11:43 AM  LOS: 3 days

## 2023-07-30 NOTE — Plan of Care (Signed)

## 2023-07-31 ENCOUNTER — Inpatient Hospital Stay (HOSPITAL_COMMUNITY): Payer: Medicare HMO

## 2023-07-31 DIAGNOSIS — R579 Shock, unspecified: Secondary | ICD-10-CM | POA: Diagnosis not present

## 2023-07-31 LAB — CBC
HCT: 28.2 % — ABNORMAL LOW (ref 36.0–46.0)
Hemoglobin: 9.1 g/dL — ABNORMAL LOW (ref 12.0–15.0)
MCH: 32.6 pg (ref 26.0–34.0)
MCHC: 32.3 g/dL (ref 30.0–36.0)
MCV: 101.1 fL — ABNORMAL HIGH (ref 80.0–100.0)
Platelets: 65 10*3/uL — ABNORMAL LOW (ref 150–400)
RBC: 2.79 MIL/uL — ABNORMAL LOW (ref 3.87–5.11)
RDW: 18.2 % — ABNORMAL HIGH (ref 11.5–15.5)
WBC: 29.1 10*3/uL — ABNORMAL HIGH (ref 4.0–10.5)
nRBC: 0.1 % (ref 0.0–0.2)

## 2023-07-31 LAB — RENAL FUNCTION PANEL
Albumin: 2 g/dL — ABNORMAL LOW (ref 3.5–5.0)
Albumin: 1.9 g/dL — ABNORMAL LOW (ref 3.5–5.0)
Anion gap: 13 (ref 5–15)
Anion gap: 11 (ref 5–15)
BUN: 11 mg/dL (ref 8–23)
BUN: 17 mg/dL (ref 8–23)
CO2: 23 mmol/L (ref 22–32)
CO2: 26 mmol/L (ref 22–32)
Calcium: 8.1 mg/dL — ABNORMAL LOW (ref 8.9–10.3)
Calcium: 8 mg/dL — ABNORMAL LOW (ref 8.9–10.3)
Chloride: 100 mmol/L (ref 98–111)
Chloride: 100 mmol/L (ref 98–111)
Creatinine, Ser: 1.15 mg/dL — ABNORMAL HIGH (ref 0.44–1.00)
Creatinine, Ser: 1.55 mg/dL — ABNORMAL HIGH (ref 0.44–1.00)
GFR, Estimated: 53 mL/min — ABNORMAL LOW (ref >60)
GFR, Estimated: 37 mL/min — ABNORMAL LOW (ref >60)
Glucose, Bld: 123 mg/dL — ABNORMAL HIGH (ref 70–99)
Glucose, Bld: 83 mg/dL (ref 70–99)
Phosphorus: 2.7 mg/dL (ref 2.5–4.6)
Phosphorus: 1.8 mg/dL — ABNORMAL LOW (ref 2.5–4.6)
Potassium: 4.2 mmol/L (ref 3.5–5.1)
Potassium: 3.8 mmol/L (ref 3.5–5.1)
Sodium: 136 mmol/L (ref 135–145)
Sodium: 137 mmol/L (ref 135–145)

## 2023-07-31 LAB — GLUCOSE, CAPILLARY
Glucose-Capillary: 126 mg/dL — ABNORMAL HIGH (ref 70–99)
Glucose-Capillary: 111 mg/dL — ABNORMAL HIGH (ref 70–99)
Glucose-Capillary: 89 mg/dL (ref 70–99)
Glucose-Capillary: 89 mg/dL (ref 70–99)
Glucose-Capillary: 96 mg/dL (ref 70–99)

## 2023-07-31 LAB — POCT I-STAT 7, (LYTES, BLD GAS, ICA,H+H)
Acid-Base Excess: 5 mmol/L — ABNORMAL HIGH (ref 0.0–2.0)
Bicarbonate: 27.7 mmol/L (ref 20.0–28.0)
Calcium, Ion: 1.08 mmol/L — ABNORMAL LOW (ref 1.15–1.40)
HCT: 26 % — ABNORMAL LOW (ref 36.0–46.0)
Hemoglobin: 8.8 g/dL — ABNORMAL LOW (ref 12.0–15.0)
O2 Saturation: 98 %
Patient temperature: 36.9
Potassium: 4.5 mmol/L (ref 3.5–5.1)
Sodium: 136 mmol/L (ref 135–145)
TCO2: 29 mmol/L (ref 22–32)
pCO2 arterial: 33.6 mm[Hg] (ref 32–48)
pH, Arterial: 7.523 — ABNORMAL HIGH (ref 7.35–7.45)
pO2, Arterial: 88 mm[Hg] (ref 83–108)

## 2023-07-31 LAB — AMMONIA: Ammonia: 24 umol/L (ref 9–35)

## 2023-07-31 LAB — APTT: aPTT: 74 s — ABNORMAL HIGH (ref 24–36)

## 2023-07-31 LAB — CULTURE, RESPIRATORY W GRAM STAIN: Gram Stain: NONE SEEN

## 2023-07-31 LAB — MAGNESIUM: Magnesium: 2.5 mg/dL — ABNORMAL HIGH (ref 1.7–2.4)

## 2023-07-31 MED ORDER — SODIUM CHLORIDE 0.9 % IV SOLN
0.0600 mg/kg/h | INTRAVENOUS | Status: DC
  Administered 2023-07-31: 0.1 mg/kg/h via INTRAVENOUS
  Filled 2023-07-31: qty 250

## 2023-07-31 MED ORDER — ONDANSETRON HCL 4 MG/2ML IJ SOLN
4.0000 mg | Freq: Four times a day (QID) | INTRAMUSCULAR | Status: DC | PRN
  Administered 2023-07-31: 4 mg via INTRAVENOUS
  Filled 2023-07-31 (×2): qty 2

## 2023-07-31 NOTE — Progress Notes (Signed)
Assessment/Plan: 67 year old WF with cirrhosis, COPD found down.  Unknown renal function baseline but now oligo/anuric AKI  1.Renal- oligoanuric AKI -  likely due to profound hypotension on an ACE , mild rhabdo with ck of over 500.  Made a little urine, 100 of protein but mostly c/w UTI -  imaging does not indicate hydro.  Due to acidosis needed to support with CRRT -  started on 10/19. Net neg 1634 mL during hospitalization.  Seen on CRRT rt fem temp All 4K baths on Levophed and Vaso CVP 7  Keep even to 50 ml/hr for now   No e/o renal recovery at this time.  2. Hypertension/volume  - appears to have septic shock although no source identified, possibly UTI -  no obvious abnormalities on bedside echo-  pressors/zosyn and vanc per CCM-  CVP only 7 now, tolerating gentle UF of only 50 3. Hypokalemia-  found down, poor nutrition-  all 4  k bath with CRRT 4. Metabolic acidosis-  corrected with CRRT  4. Anemia  - could be due to GIB, CKD-  interestingly went from 8's to 10's without intervention 5. Elytes-  better on CRRT (all 4K baths)  Subjective:  CRRT running well-  still pressor req with minimal UOP (Levo 54m Vaso 0.03)   Objective Vital signs in last 24 hours: Vitals:   07/31/23 0845 07/31/23 0848 07/31/23 0900 07/31/23 0906  BP: 93/65  102/68   Pulse: (!) 109  (!) 110 (!) 109  Resp: 11  16 (!) 21  Temp: 99 F (37.2 C)  98.2 F (36.8 C) 97.7 F (36.5 C)  TempSrc:      SpO2: 100% 100% 100% 100%  Weight:      Height:       Weight change: -4.7 kg  Intake/Output Summary (Last 24 hours) at 07/31/2023 1038 Last data filed at 07/31/2023 1000 Gross per 24 hour  Intake 1312.03 ml  Output 6067.8 ml  Net -4755.77 ml      Carol Rowe W    Labs: Basic Metabolic Panel: Recent Labs  Lab 07/30/23 0301 07/30/23 1327 07/30/23 1532 07/30/23 1938 07/30/23 1944 07/31/23 0450  NA 132*   < > 135  --  135 136  K 4.0   < > 3.9  --  3.9 4.2  CL 96*  --  101  --   --  100  CO2 24   --  23  --   --  23  GLUCOSE 187*  --  167*  --   --  123*  BUN 10  --  9  --   --  11  CREATININE 1.23*  --  1.27*  --   --  1.15*  CALCIUM 7.9*  --  7.8*  --   --  8.1*  PHOS 1.4*  --  2.8 2.0*  --  2.7   < > = values in this interval not displayed.   Liver Function Tests: Recent Labs  Lab 07/27/23 2201 07/28/23 1508 07/30/23 0301 07/30/23 1532 07/31/23 0450  AST 67*  --   --   --   --   ALT 32  --   --   --   --   ALKPHOS 208*  --   --   --   --   BILITOT 1.0  --   --   --   --   PROT 6.3*  --   --   --   --  ALBUMIN 3.2*   < > 2.1* 2.2* 2.0*   < > = values in this interval not displayed.   No results for input(s): "LIPASE", "AMYLASE" in the last 168 hours. Recent Labs  Lab 07/28/23 1725  AMMONIA 50*   CBC: Recent Labs  Lab 07/27/23 2201 07/28/23 0234 07/28/23 0345 07/28/23 1855 07/29/23 0340 07/29/23 0859 07/30/23 0301 07/30/23 1327 07/30/23 1944 07/31/23 0450  WBC 15.2* 9.2  --  14.7* 14.3*  --  31.8*  --   --  29.1*  NEUTROABS 14.3*  --   --   --   --   --   --   --   --   --   HGB 8.8* 8.6*   < > 9.2* 9.3*   < > 9.3* 10.5* 10.9* 9.1*  HCT 27.3* 25.7*   < > 27.5* 27.2*   < > 28.6* 31.0* 32.0* 28.2*  MCV 103.0* 97.7  --  96.8 96.5  --  101.4*  --   --  101.1*  PLT 227 194  --  150 124*  --  97*  --   --  65*   < > = values in this interval not displayed.   Cardiac Enzymes: Recent Labs  Lab 07/27/23 2201  CKTOTAL 519*   CBG: Recent Labs  Lab 07/30/23 1534 07/30/23 1937 07/30/23 2312 07/31/23 0448 07/31/23 0808  GLUCAP 171* 133* 131* 126* 111*    Iron Studies: No results for input(s): "IRON", "TIBC", "TRANSFERRIN", "FERRITIN" in the last 72 hours. Studies/Results: No results found. Medications: Infusions:   prismasol BGK 4/2.5 400 mL/hr at 07/30/23 2300    prismasol BGK 4/2.5 400 mL/hr at 07/30/23 2300   bivalirudin (ANGIOMAX) 250 mg in sodium chloride 0.9 % 500 mL (0.5 mg/mL) infusion     feeding supplement (VITAL 1.5 CAL) Stopped  (07/30/23 1201)   norepinephrine (LEVOPHED) Adult infusion 2 mcg/min (07/31/23 1000)   piperacillin-tazobactam Stopped (07/31/23 0951)   prismasol BGK 4/2.5 1,000 mL/hr at 07/31/23 0400   propofol (DIPRIVAN) infusion Stopped (07/31/23 0805)   vasopressin Stopped (07/31/23 0831)    Scheduled Medications:  calcium carbonate  1 tablet Per Tube BID   Chlorhexidine Gluconate Cloth  6 each Topical Daily   docusate  100 mg Per Tube BID   feeding supplement (PROSource TF20)  60 mL Per Tube BID   folic acid  1 mg Per Tube Daily   hydrocortisone sod succinate (SOLU-CORTEF) inj  50 mg Intravenous Q12H   Followed by   Melene Muller ON 08/02/2023] predniSONE  40 mg Oral Q breakfast   Followed by   Melene Muller ON 08/03/2023] predniSONE  30 mg Oral Q breakfast   Followed by   Melene Muller ON 08/04/2023] predniSONE  20 mg Oral Q breakfast   Followed by   Melene Muller ON 08/05/2023] predniSONE  10 mg Oral Q breakfast   insulin aspart  0-15 Units Subcutaneous Q4H   lactulose  30 g Per Tube TID   lidocaine  10 mL Intradermal Once   multivitamin  1 tablet Per Tube QHS   pantoprazole (PROTONIX) IV  40 mg Intravenous Q24H   polyethylene glycol  17 g Per Tube Daily   sodium chloride flush  10-40 mL Intracatheter Q12H   thiamine  100 mg Oral Daily   Or   thiamine  100 mg Intravenous Daily    have reviewed scheduled and prn medications.  Physical Exam: General:sedated on vent Heart:RRR Lungs: CBS bilat Abdomen: slight distention Extremities: pitting edema  Dialysis Access:  fem HD cath placed 10/19    07/31/2023,10:38 AM  LOS: 4 days

## 2023-07-31 NOTE — Progress Notes (Signed)
Assessment/Plan: 67 year old WF with cirrhosis, COPD found down.  Unknown renal function baseline but now oligo/anuric AKI 1.Renal- oligoanuric AKI -  likely due to profound hypotension on an ACE , mild rhabdo with ck of over 500.  Made a little urine, 100 of protein but mostly c/Rowe UTI -  imaging does not indicate hydro.  Due to acidosis needed to support with CRRT -  started on 10/19-10/22  Seen on CRRT rt fem temp All 4K baths off pressors now  No e/o renal recovery at this time but would like to stop the CRRT at filter change today and see where she's at. Foley present currently without minimal UOP which is common while on CRRT.  2. Hypertension/volume  - appears to have septic shock although no source identified, possibly UTI -  no obvious abnormalities on bedside echo-  pressors/zosyn and vanc per CCM-  CVP not elevated. Tolerating gentle UF of only 50 3. Hypokalemia-  found down, poor nutrition-  all 4  k bath with CRRT -> will stop  on 10/22. 4. Metabolic acidosis-  corrected   4. Anemia  - could be due to GIB, CKD-  interestingly went from 8's to 10's without intervention    Subjective:  CRRT running well but due for a filter change today; off pressors and extubated.  Objective Vital signs in last 24 hours: Vitals:   07/31/23 0845 07/31/23 0848 07/31/23 0900 07/31/23 0906  BP: 93/65  102/68   Pulse: (!) 109  (!) 110 (!) 109  Resp: 11  16 (!) 21  Temp: 99 F (37.2 C)  98.2 F (36.8 C) 97.7 F (36.5 C)  TempSrc:      SpO2: 100% 100% 100% 100%  Weight:      Height:       Weight change: -4.7 kg  Intake/Output Summary (Last 24 hours) at 07/31/2023 1041 Last data filed at 07/31/2023 1000 Gross per 24 hour  Intake 1312.03 ml  Output 6067.8 ml  Net -4755.77 ml     Carol Rowe    Labs: Basic Metabolic Panel: Recent Labs  Lab 07/30/23 0301 07/30/23 1327 07/30/23 1532 07/30/23 1938 07/30/23 1944 07/31/23 0450  NA 132*   < > 135  --  135 136  K 4.0   < > 3.9  --   3.9 4.2  CL 96*  --  101  --   --  100  CO2 24  --  23  --   --  23  GLUCOSE 187*  --  167*  --   --  123*  BUN 10  --  9  --   --  11  CREATININE 1.23*  --  1.27*  --   --  1.15*  CALCIUM 7.9*  --  7.8*  --   --  8.1*  PHOS 1.4*  --  2.8 2.0*  --  2.7   < > = values in this interval not displayed.   Liver Function Tests: Recent Labs  Lab 07/27/23 2201 07/28/23 1508 07/30/23 0301 07/30/23 1532 07/31/23 0450  AST 67*  --   --   --   --   ALT 32  --   --   --   --   ALKPHOS 208*  --   --   --   --   BILITOT 1.0  --   --   --   --   PROT 6.3*  --   --   --   --  ALBUMIN 3.2*   < > 2.1* 2.2* 2.0*   < > = values in this interval not displayed.   No results for input(s): "LIPASE", "AMYLASE" in the last 168 hours. Recent Labs  Lab 07/28/23 1725  AMMONIA 50*   CBC: Recent Labs  Lab 07/27/23 2201 07/28/23 0234 07/28/23 0345 07/28/23 1855 07/29/23 0340 07/29/23 0859 07/30/23 0301 07/30/23 1327 07/30/23 1944 07/31/23 0450  WBC 15.2* 9.2  --  14.7* 14.3*  --  31.8*  --   --  29.1*  NEUTROABS 14.3*  --   --   --   --   --   --   --   --   --   HGB 8.8* 8.6*   < > 9.2* 9.3*   < > 9.3* 10.5* 10.9* 9.1*  HCT 27.3* 25.7*   < > 27.5* 27.2*   < > 28.6* 31.0* 32.0* 28.2*  MCV 103.0* 97.7  --  96.8 96.5  --  101.4*  --   --  101.1*  PLT 227 194  --  150 124*  --  97*  --   --  65*   < > = values in this interval not displayed.   Cardiac Enzymes: Recent Labs  Lab 07/27/23 2201  CKTOTAL 519*   CBG: Recent Labs  Lab 07/30/23 1534 07/30/23 1937 07/30/23 2312 07/31/23 0448 07/31/23 0808  GLUCAP 171* 133* 131* 126* 111*    Iron Studies: No results for input(s): "IRON", "TIBC", "TRANSFERRIN", "FERRITIN" in the last 72 hours. Studies/Results: No results found. Medications: Infusions:   prismasol BGK 4/2.5 400 mL/hr at 07/30/23 2300    prismasol BGK 4/2.5 400 mL/hr at 07/30/23 2300   bivalirudin (ANGIOMAX) 250 mg in sodium chloride 0.9 % 500 mL (0.5 mg/mL) infusion      feeding supplement (VITAL 1.5 CAL) Stopped (07/30/23 1201)   norepinephrine (LEVOPHED) Adult infusion 2 mcg/min (07/31/23 1000)   piperacillin-tazobactam Stopped (07/31/23 0951)   prismasol BGK 4/2.5 1,000 mL/hr at 07/31/23 0400   propofol (DIPRIVAN) infusion Stopped (07/31/23 0805)   vasopressin Stopped (07/31/23 0831)    Scheduled Medications:  calcium carbonate  1 tablet Per Tube BID   Chlorhexidine Gluconate Cloth  6 each Topical Daily   docusate  100 mg Per Tube BID   feeding supplement (PROSource TF20)  60 mL Per Tube BID   folic acid  1 mg Per Tube Daily   hydrocortisone sod succinate (SOLU-CORTEF) inj  50 mg Intravenous Q12H   Followed by   Melene Muller ON 08/02/2023] predniSONE  40 mg Oral Q breakfast   Followed by   Melene Muller ON 08/03/2023] predniSONE  30 mg Oral Q breakfast   Followed by   Melene Muller ON 08/04/2023] predniSONE  20 mg Oral Q breakfast   Followed by   Melene Muller ON 08/05/2023] predniSONE  10 mg Oral Q breakfast   insulin aspart  0-15 Units Subcutaneous Q4H   lactulose  30 g Per Tube TID   lidocaine  10 mL Intradermal Once   multivitamin  1 tablet Per Tube QHS   pantoprazole (PROTONIX) IV  40 mg Intravenous Q24H   polyethylene glycol  17 g Per Tube Daily   sodium chloride flush  10-40 mL Intracatheter Q12H   thiamine  100 mg Oral Daily   Or   thiamine  100 mg Intravenous Daily    have reviewed scheduled and prn medications.  Physical Exam: General:extubated, confused but pleasant Heart:RRR Lungs: CBS bilat Abdomen: slight distention Extremities: pitting edema  Dialysis  Access: fem HD cath placed 10/19    07/31/2023,10:41 AM  LOS: 4 days

## 2023-07-31 NOTE — Progress Notes (Signed)
NAME:  Carol Rowe, MRN:  [id number], DOB:  1956-05-04, LOS: 4 ADMISSION DATE:  07/27/2023 CHIEF COMPLAINT:  Shock   History of Present Illness:  This is a case of a 67 year old female patient with a past medical history of alcohol use disorder, alcoholic liver cirrhosis, essential hypertension and presumed COPD presenting to [facility] as a transfer from [facility] for shock.  History was mainly taken from the patient at bedside and chart dissection.  She initially presented to [facility] on 10/18 after a family member went over to her house and found her down.  She apparently was walking to the bathroom and fell down and struck her head.  EMS were called by family members and on arrival she was found down hypoglycemic at the 30s.  She was given IV dextrose and transferred to [facility].  Last known normal 6 days ago.  In the ED she was found to be hypotensive, altered with left-sided focal deficits.  Therefore a code stroke was called and a stat CT head was obtained which was nonrevealing.  Neurology was consulted and felt less likely to be stroke as no focal weakness was present on their exam.  Felt this was secondary to metabolic encephalopathy.  Furthermore, labs were initially concerning for leukocytosis with a WBC count of 15.2.  Hyponatremia with a sodium of 122 and severe hypokalemia with a potassium of 2.0.  Also noted to have severe AKI with a creatinine of 6.84 no previous labs on file to determine baseline. Lactic acidosis also noted at 4.3. She was given 3 L normal saline and started on norepinephrine.  Blood culture was drawn and given broad-spectrum antibiotics.  Also central line was placed at [facility].  Chest x-ray with no focal consolidation.  Transferred here for further management.  On encounter. Patient A&O x3 to self time and place. 1L LR given and again noted to be hypoglycemic requiring another D50 push. She was on 12 mcg/min NE. A-line placed. Continue  on broad spec abx pending culture data. Remained Anuric, CT A/P pending.  Bedside echocardiogram with normal EF. RV not enlarge and no pericardial effusion. Repeat labs with normalized lactic acid.    Interval history:   No events, O2 needs down a bit.  Objective   Blood pressure 92/70, pulse (!) 102, temperature 99 F (37.2 C), resp. rate 13, height 5\' 3"  (1.6 m), weight 55.4 kg, SpO2 100%. CVP:  [1 mmHg-12 mmHg] 11 mmHg  Vent Mode: PRVC FiO2 (%):  [40 %-80 %] 40 % Set Rate:  [22 bmp] 22 bmp Vt Set:  [410 mL] 410 mL PEEP:  [5 cmH20-8 cmH20] 5 cmH20 Plateau Pressure:  [14 cmH20-20 cmH20] 20 cmH20 Intake/Output Summary (Last 24 hours) at 07/31/2023 0819 Last data filed at 07/31/2023 0800 Gross per 24 hour  Intake 1766.4 ml  Output 6204.6 ml  Net -4438.2 ml   Filed Weights   07/29/23 0500 07/30/23 0422 07/31/23 0500  Weight: 60.3 kg 60.1 kg 55.4 kg    Examination: On vent Moving ext but not following commands R gaze preference Pupils equal Lungs w/ scattered rhonci  Abd soft Slight jaundice Ongoing CRRT  GTT: prop/fent/levo/vaso WBC up Plts down somewhat correlating w/ heparin administration 3 days ago  Assessment & Plan:   Septic shock, Likely RLL pneumonia. Blood cultures remain negative.  Plan: Continue to monitor white blood cell count, remains elevated likely due to steroids Continue Zosyn  Anemia; stable H&H stable, no signs of  significant bleeding  Thrombocytopenia- intermediate 4T score, will switch to bival, send HIT screen, SRA  AKI, oliguric Hyponatremia hypokalemia AGMA; worsening Hypophosphatemia - -CRRT per nephrology -strict I/O -renally dose meds, avoid nephrotoxic meds  Hypocalcemia -on Tums  Acute respiratory failure with hypoxia requiring MV -LTVV -VAP prevention protocol -Will try to extubate today  H/o ETOH abuse Cirrhosis, acutely decompensated. MELD-Na+ 29 Mild coagulopathy Mild hyperammonemia Encephalopathy- query  sedation R/o stroke- initial head ct neg -multivitamins, folate thiamine  -lactulose -see how does with SAT and extubation, may need MRI  Hypoglycemia, resolved, now hyperglycemic -con't accuchecks, not requiring insulin -goal BG <180  Moderate protein energy malnutrition -TF, vitamins   Best Practice (right click and "Reselect all SmartList Selections" daily)   Diet/type: tubefeeds DVT prophylaxis: argatroban GI prophylaxis: PPI Lines: Central line Foley:  Yes, and it is still needed Code Status:  full code   This patient is critically ill with multiple organ system failure; which, requires frequent high complexity decision making, assessment, support, evaluation, and titration of therapies. This was completed through the application of advanced monitoring technologies and extensive interpretation of multiple databases. During this encounter critical care time was devoted to patient care services described in this note for 31 minutes.  Lorin Glass, MD [facility] 07/31/2023 8:19 AM

## 2023-07-31 NOTE — Progress Notes (Addendum)
PHARMACY - ANTICOAGULATION CONSULT NOTE  Pharmacy Consult for bivalirudin Indication: VTE prophylaxis and concern for HITT  Allergies  Allergen Reactions   Diclofenac Other (See Comments)   Macrobid [Nitrofurantoin Macrocrystal] Hives   Mirtazapine Swelling    Patient Measurements: Height: 5\' 3"  (160 cm) Weight: 55.4 kg (122 lb 2.2 oz) IBW/kg (Calculated) : 52.4 Heparin Dosing Weight: 55.4  Vital Signs: Temp: 99 F (37.2 C) (10/22 0830) Temp Source: Bladder (10/22 0400) BP: 106/69 (10/22 0830) Pulse Rate: 106 (10/22 0830)  Labs: Recent Labs    07/28/23 1930 07/29/23 0340 07/29/23 0859 07/30/23 0301 07/30/23 1327 07/30/23 1532 07/30/23 1944 07/31/23 0450  HGB  --  9.3*   < > 9.3* 10.5*  --  10.9* 9.1*  HCT  --  27.2*   < > 28.6* 31.0*  --  32.0* 28.2*  PLT  --  124*  --  97*  --   --   --  65*  LABPROT 19.4*  --   --   --   --   --   --   --   INR 1.6*  --   --   --   --   --   --   --   CREATININE  --  3.01*  2.98*   < > 1.23*  --  1.27*  --  1.15*   < > = values in this interval not displayed.    Estimated Creatinine Clearance: 40.3 mL/min (A) (by C-G formula based on SCr of 1.15 mg/dL (H)).   Medical History: Past Medical History:  Diagnosis Date   Alcoholic (HCC)    Arthritis    Hypertension    VIN III (vulvar intraepithelial neoplasia III)    Vulvar lesion     Medications:  Medications Prior to Admission  Medication Sig Dispense Refill Last Dose   acetaminophen (TYLENOL) 500 MG tablet Take 1,000 mg by mouth every 6 (six) hours as needed for moderate pain.      albuterol (VENTOLIN HFA) 108 (90 Base) MCG/ACT inhaler Inhale 2 puffs into the lungs 2 (two) times daily as needed for shortness of breath.      allopurinol (ZYLOPRIM) 100 MG tablet Take by mouth.      ALPRAZolam (XANAX) 0.5 MG tablet Take 0.5 mg by mouth 3 (three) times daily as needed for anxiety.       ascorbic acid (VITAMIN C) 500 MG tablet Take 500 mg by mouth every other day.       aspirin EC 325 MG tablet Take 325 mg by mouth daily as needed (headaches).      B Complex Vitamins (B COMPLEX PO) Take 1 Dose by mouth every other day. Liquid b complex      cetirizine (ZYRTEC) 10 MG tablet Take 10 mg by mouth daily as needed for allergies.      Cholecalciferol (DIALYVITE VITAMIN D 5000) 125 MCG (5000 UT) capsule Take 5,000 Units by mouth every other day.      estradiol (VIVELLE-DOT) 0.1 MG/24HR Place 1 patch onto the skin 2 (two) times a week.      fluticasone (FLONASE) 50 MCG/ACT nasal spray Place 2 sprays into both nostrils daily.      folic acid (FOLVITE) 1 MG tablet       furosemide (LASIX) 40 MG tablet Take 1 tablet by mouth daily.      gabapentin (NEURONTIN) 300 MG capsule Take 300 mg by mouth at bedtime.      Glycerin-Polysorbate 80 (REFRESH DRY  EYE THERAPY OP) Place 1 drop into both eyes daily as needed (dry eyes).      levocetirizine (XYZAL) 5 MG tablet Take 5 mg by mouth at bedtime.      Magnesium 400 MG CAPS Take 400 mg by mouth every other day.      Menthol-Methyl Salicylate (SALONPAS PAIN RELIEF PATCH EX) Apply 1-3 patches topically daily as needed (pain).      metoprolol succinate (TOPROL-XL) 25 MG 24 hr tablet Take 1 tablet by mouth daily.      OVER THE COUNTER MEDICATION Take 6 tablets by mouth daily as needed (constipation). Tam herbal laxative      pantoprazole (PROTONIX) 40 MG tablet Take 40 mg by mouth See admin instructions. Take 40 mg daily may take a second 40 mg dose as needed for heartburn      potassium chloride (KLOR-CON) 10 MEQ tablet Take 10 mEq by mouth daily.      Probiotic CAPS Take 1 capsule by mouth every other day.      ramipril (ALTACE) 2.5 MG capsule Take 2.5 mg by mouth daily.      rosuvastatin (CRESTOR) 5 MG tablet Take 5 mg by mouth daily.      torsemide (DEMADEX) 20 MG tablet Take 20-40 mg by mouth See admin instructions. Take 20 or 40 mg once daily in the morning      TRELEGY ELLIPTA 100-62.5-25 MCG/ACT AEPB Inhale 1 puff into the lungs  daily.      trolamine salicylate (ASPERCREME) 10 % cream Apply 1 application topically as needed for muscle pain.      Turmeric Curcumin 500 MG CAPS Take 500 mg by mouth every other day.      valACYclovir (VALTREX) 1000 MG tablet Take 500-1,000 mg by mouth See admin instructions. Take 500 mg daily, may take 1000 mg instead if currently breaking out      vitamin E 180 MG (400 UNITS) capsule Take 400 Units by mouth every other day.      Wheat Dextrin (BENEFIBER DRINK MIX) PACK Take 4 g by mouth at bedtime.      Zinc 50 MG CAPS Take 50 mg by mouth every other day.      Scheduled:   calcium carbonate  1 tablet Per Tube BID   Chlorhexidine Gluconate Cloth  6 each Topical Daily   docusate  100 mg Per Tube BID   feeding supplement (PROSource TF20)  60 mL Per Tube BID   folic acid  1 mg Per Tube Daily   hydrocortisone sod succinate (SOLU-CORTEF) inj  50 mg Intravenous Q12H   Followed by   Melene Muller ON 08/02/2023] predniSONE  40 mg Oral Q breakfast   Followed by   Melene Muller ON 08/03/2023] predniSONE  30 mg Oral Q breakfast   Followed by   Melene Muller ON 08/04/2023] predniSONE  20 mg Oral Q breakfast   Followed by   Melene Muller ON 08/05/2023] predniSONE  10 mg Oral Q breakfast   insulin aspart  0-15 Units Subcutaneous Q4H   lactulose  30 g Per Tube TID   lidocaine  10 mL Intradermal Once   multivitamin  1 tablet Per Tube QHS   mouth rinse  15 mL Mouth Rinse Q2H   pantoprazole (PROTONIX) IV  40 mg Intravenous Q24H   polyethylene glycol  17 g Per Tube Daily   sodium chloride flush  10-40 mL Intracatheter Q12H   thiamine  100 mg Oral Daily   Or   thiamine  100 mg  Intravenous Daily   Infusions:    prismasol BGK 4/2.5 400 mL/hr at 07/30/23 2300    prismasol BGK 4/2.5 400 mL/hr at 07/30/23 2300   bivalirudin (ANGIOMAX) 250 mg in sodium chloride 0.9 % 500 mL (0.5 mg/mL) infusion     feeding supplement (VITAL 1.5 CAL) Stopped (07/30/23 1201)   fentaNYL infusion INTRAVENOUS 25 mcg/hr (07/30/23 1655)    norepinephrine (LEVOPHED) Adult infusion 5 mcg/min (07/31/23 0800)   piperacillin-tazobactam Stopped (07/31/23 0340)   prismasol BGK 4/2.5 1,000 mL/hr at 07/31/23 0400   propofol (DIPRIVAN) infusion 25 mcg/kg/min (07/31/23 0800)   vasopressin 0.01 Units/min (07/31/23 0800)   PRN: docusate sodium, fentaNYL, heparin, HYDROmorphone (DILAUDID) injection, mouth rinse, polyethylene glycol, sodium chloride, sodium chloride flush  Assessment: 67 YOF arrived at [facility] altered and hypotensive and was admitted for treatment of septic shock. Patient was previously receiving SQ heparin for DVT ppx (last dose 10/19 at 1400). Platelets have progressively decreased over the past 5 days (227 >150 >124 >97> 65). With concerns for HITT, will order HIT antibody and SRA. Will also start therapeutic AC with bivalirudin. No baseline aPTT. H/H remains stable. No overt s/sx of bleeding or bruising.  Goal of Therapy:  aPTT 50-85 seconds Monitor platelets by anticoagulation protocol: Yes   Plan:  Start bivalirudin at 0.1 mg/kg/hour Check aPTT in 4 hours Monitor daily aPTT, CBC, and s/sx of bleeding  Wilmer Floor, PharmD PGY2 Cardiology Pharmacy Resident 07/31/2023,8:48 AM  UPDATE 07/31/23 14:03:  aPTT is on the higher end of therapeutic range at 74 seconds on bivalirudin 0.1 mg/kg/hour. No signs/symptoms of bleeding/bruising. Reduce rate to 0.08 mg/kg/hour. Follow-up aPTT with AM labs.

## 2023-07-31 NOTE — Procedures (Signed)
Extubation Procedure Note  Patient Details:   Name: Carol Rowe DOB: Feb 28, 1956 MRN: [id number]   Airway Documentation:    Vent end date: 07/31/23 Vent end time: 0906   Evaluation  O2 sats: stable throughout Complications: No apparent complications Patient did tolerate procedure well. Bilateral Breath Sounds: Diminished, Clear   No  Patient extubated per MD order. Positive cuff leak. No stridor noted. Vitals are stable on 4L [place]. RN at bedside.  Harmon Dun Errika Narvaiz 07/31/2023, 9:07 AM

## 2023-07-31 NOTE — Plan of Care (Signed)

## 2023-08-01 ENCOUNTER — Inpatient Hospital Stay (HOSPITAL_COMMUNITY): Payer: Medicare HMO

## 2023-08-01 DIAGNOSIS — G9340 Encephalopathy, unspecified: Secondary | ICD-10-CM | POA: Diagnosis not present

## 2023-08-01 DIAGNOSIS — R569 Unspecified convulsions: Secondary | ICD-10-CM | POA: Diagnosis not present

## 2023-08-01 DIAGNOSIS — R579 Shock, unspecified: Secondary | ICD-10-CM | POA: Diagnosis not present

## 2023-08-01 LAB — CULTURE, BLOOD (ROUTINE X 2): Special Requests: ADEQUATE

## 2023-08-01 LAB — HEPARIN INDUCED PLATELET AB (HIT ANTIBODY): Heparin Induced Plt Ab: 0.06 {OD_unit} (ref 0.000–0.400)

## 2023-08-01 LAB — RENAL FUNCTION PANEL
Albumin: 2.1 g/dL — ABNORMAL LOW (ref 3.5–5.0)
Albumin: 1.9 g/dL — ABNORMAL LOW (ref 3.5–5.0)
Anion gap: 15 (ref 5–15)
Anion gap: 17 — ABNORMAL HIGH (ref 5–15)
BUN: 28 mg/dL — ABNORMAL HIGH (ref 8–23)
BUN: 41 mg/dL — ABNORMAL HIGH (ref 8–23)
CO2: 24 mmol/L (ref 22–32)
CO2: 23 mmol/L (ref 22–32)
Calcium: 8.4 mg/dL — ABNORMAL LOW (ref 8.9–10.3)
Calcium: 8.1 mg/dL — ABNORMAL LOW (ref 8.9–10.3)
Chloride: 98 mmol/L (ref 98–111)
Chloride: 100 mmol/L (ref 98–111)
Creatinine, Ser: 2.22 mg/dL — ABNORMAL HIGH (ref 0.44–1.00)
Creatinine, Ser: 2.69 mg/dL — ABNORMAL HIGH (ref 0.44–1.00)
GFR, Estimated: 24 mL/min — ABNORMAL LOW (ref >60)
GFR, Estimated: 19 mL/min — ABNORMAL LOW (ref >60)
Glucose, Bld: 112 mg/dL — ABNORMAL HIGH (ref 70–99)
Glucose, Bld: 120 mg/dL — ABNORMAL HIGH (ref 70–99)
Phosphorus: 3.4 mg/dL (ref 2.5–4.6)
Phosphorus: 2.8 mg/dL (ref 2.5–4.6)
Potassium: 3.8 mmol/L (ref 3.5–5.1)
Potassium: 3.4 mmol/L — ABNORMAL LOW (ref 3.5–5.1)
Sodium: 137 mmol/L (ref 135–145)
Sodium: 140 mmol/L (ref 135–145)

## 2023-08-01 LAB — GLUCOSE, CAPILLARY
Glucose-Capillary: 106 mg/dL — ABNORMAL HIGH (ref 70–99)
Glucose-Capillary: 118 mg/dL — ABNORMAL HIGH (ref 70–99)
Glucose-Capillary: 131 mg/dL — ABNORMAL HIGH (ref 70–99)
Glucose-Capillary: 147 mg/dL — ABNORMAL HIGH (ref 70–99)
Glucose-Capillary: 53 mg/dL — ABNORMAL LOW (ref 70–99)
Glucose-Capillary: 93 mg/dL (ref 70–99)
Glucose-Capillary: 97 mg/dL (ref 70–99)

## 2023-08-01 LAB — CBC
HCT: 26.5 % — ABNORMAL LOW (ref 36.0–46.0)
Hemoglobin: 8.4 g/dL — ABNORMAL LOW (ref 12.0–15.0)
MCH: 32.2 pg (ref 26.0–34.0)
MCHC: 31.7 g/dL (ref 30.0–36.0)
MCV: 101.5 fL — ABNORMAL HIGH (ref 80.0–100.0)
Platelets: 52 10*3/uL — ABNORMAL LOW (ref 150–400)
RBC: 2.61 MIL/uL — ABNORMAL LOW (ref 3.87–5.11)
RDW: 18.7 % — ABNORMAL HIGH (ref 11.5–15.5)
WBC: 15.2 10*3/uL — ABNORMAL HIGH (ref 4.0–10.5)
nRBC: 0.5 % — ABNORMAL HIGH (ref 0.0–0.2)

## 2023-08-01 LAB — APTT
aPTT: 70 s — ABNORMAL HIGH (ref 24–36)
aPTT: 67 s — ABNORMAL HIGH (ref 24–36)

## 2023-08-01 LAB — MAGNESIUM: Magnesium: 2.6 mg/dL — ABNORMAL HIGH (ref 1.7–2.4)

## 2023-08-01 MED ORDER — FREE WATER
100.0000 mL | Freq: Four times a day (QID) | Status: DC
  Administered 2023-08-01 – 2023-08-05 (×14): 100 mL

## 2023-08-01 MED ORDER — BISACODYL 10 MG RE SUPP
10.0000 mg | Freq: Every day | RECTAL | Status: DC
  Administered 2023-08-01 – 2023-08-03 (×3): 10 mg via RECTAL
  Filled 2023-08-01 (×3): qty 1

## 2023-08-01 MED ORDER — DEXTROSE 50 % IV SOLN: 25.0000 g | Freq: Once | INTRAVENOUS | Status: AC

## 2023-08-01 MED ORDER — SENNA 8.6 MG PO TABS: 1.0000 | ORAL_TABLET | Freq: Every day | ORAL | Status: DC

## 2023-08-01 MED ORDER — POLYETHYLENE GLYCOL 3350 17 G PO PACK: 17.0000 g | PACK | Freq: Every day | ORAL | Status: DC | PRN

## 2023-08-01 MED ORDER — POTASSIUM CHLORIDE 10 MEQ/50ML IV SOLN
10.0000 meq | INTRAVENOUS | Status: AC
Start: 2023-08-01 — End: 2023-08-01
  Administered 2023-08-01 (×3): 10 meq via INTRAVENOUS
  Filled 2023-08-01 (×3): qty 50

## 2023-08-01 MED ORDER — PIPERACILLIN-TAZOBACTAM 3.375 G IVPB
3.3750 g | Freq: Three times a day (TID) | INTRAVENOUS | Status: DC
  Administered 2023-08-01: 3.375 g via INTRAVENOUS
  Filled 2023-08-01 (×2): qty 50

## 2023-08-01 MED ORDER — PROCHLORPERAZINE EDISYLATE 10 MG/2ML IJ SOLN
5.0000 mg | Freq: Once | INTRAMUSCULAR | Status: AC | PRN
  Administered 2023-08-01: 5 mg via INTRAVENOUS
  Filled 2023-08-01: qty 1

## 2023-08-01 MED ORDER — POLYETHYLENE GLYCOL 3350 17 G PO PACK: 17.0000 g | PACK | Freq: Every day | ORAL | Status: DC

## 2023-08-01 MED ORDER — CHEWING GUM (ORBIT) SUGAR FREE
1.0000 | CHEWING_GUM | Freq: Three times a day (TID) | ORAL | Status: DC
  Administered 2023-08-01: 1 via ORAL
  Filled 2023-08-01: qty 1

## 2023-08-01 MED ORDER — METOCLOPRAMIDE HCL 5 MG/ML IJ SOLN
5.0000 mg | Freq: Three times a day (TID) | INTRAMUSCULAR | Status: DC
  Administered 2023-08-01 – 2023-08-06 (×15): 5 mg via INTRAVENOUS
  Filled 2023-08-01 (×15): qty 2

## 2023-08-01 MED ORDER — DEXTROSE 50 % IV SOLN
INTRAVENOUS | Status: AC
  Administered 2023-08-01: 25 g via INTRAVENOUS
  Filled 2023-08-01: qty 50

## 2023-08-01 MED ORDER — PIPERACILLIN-TAZOBACTAM IN DEX 2-0.25 GM/50ML IV SOLN
2.2500 g | Freq: Three times a day (TID) | INTRAVENOUS | Status: DC
  Administered 2023-08-01 – 2023-08-02 (×2): 2.25 g via INTRAVENOUS
  Filled 2023-08-01 (×3): qty 50

## 2023-08-01 MED ORDER — POTASSIUM CHLORIDE CRYS ER 20 MEQ PO TBCR: 20.0000 meq | EXTENDED_RELEASE_TABLET | Freq: Once | ORAL | Status: DC

## 2023-08-01 MED ORDER — SENNA 8.6 MG PO TABS: 1.0000 | ORAL_TABLET | Freq: Every day | ORAL | Status: DC | PRN

## 2023-08-01 NOTE — Progress Notes (Signed)
Brief Nutrition Follow-up  Pt extubated on 10/22. Noted significant vomiting overnight, NG replaced with 1.5 L out thus far today, dark appearing output  No BM since 10/19; abdomen distended and firm, noted pt also with hx of cirrhosis with ascites. Fluid-filled, mildly distended loops of small bowel throughoutthe central abdomen, measuring up to 4.2 cm in the central abdomen. No clear transition point and there is a significant volume of gas and fluid present in the proximal colon. Findings are most consistent with ileus, partial or developing distal small bowel obstruction not excluded.  Recommend considering enema and/or suppository given no BM in 4 days  Abd xray with NG in stomach, distention of SB loops  CT head with no acute abnormality.   Noted episode of hypoglycemia in last 24 hours.  If unable to advance diet and/or initiate EN via NG, recommend considering TPN as pt is severely malnourished  NFPE performed today and indicates severe malnutrition.  Flowsheet Row Most Recent Value  Orbital Region Severe depletion  Thoracic and Lumbar Region Unable to assess  Buccal Region Severe depletion  Temple Region Severe depletion  Clavicle Bone Region Severe depletion  Clavicle and Acromion Bone Region Severe depletion  Scapular Bone Region Severe depletion  Dorsal Hand Unable to assess  Patellar Region Severe depletion  Anterior Thigh Region Severe depletion  Posterior Calf Region Severe depletion  Edema (RD Assessment) --  [ascites]  Eyes Unable to assess  Mouth Unable to assess  Skin Reviewed  [ecchymosis, bruising, skin tears]  Nails Unable to assess       Romelle Starcher MS, RDN, LDN, CNSC Registered Dietitian 3 Clinical Nutrition RD Pager and On-Call Pager Number Located in [place]

## 2023-08-01 NOTE — Progress Notes (Signed)
PHARMACY NOTE:  ANTIMICROBIAL RENAL DOSAGE ADJUSTMENT  Current antimicrobial regimen includes a mismatch between antimicrobial dosage and estimated renal function.  As per policy approved by the Pharmacy & Therapeutics and Medical Executive Committees, the antimicrobial dosage will be adjusted accordingly.  Current antimicrobial dosage:  Zosyn EID 3.375gm IV Q8H  Indication: sepsis/PNA  Renal Function:  Estimated Creatinine Clearance: 17.2 mL/min (A) (by C-G formula based on SCr of 2.69 mg/dL (H)). []      On intermittent HD, scheduled: []      On CRRT - f/u to see if will restart    Antimicrobial dosage has been changed to:  Zosyn 2.25gm IV Q8H  Esteen Delpriore D. Laney Potash, PharmD, BCPS, BCCCP 08/01/2023, 7:53 PM

## 2023-08-01 NOTE — Progress Notes (Addendum)
PHARMACY - ANTICOAGULATION CONSULT NOTE  Pharmacy Consult for bivalirudin Indication: VTE prophylaxis and concern for HITT  Allergies  Allergen Reactions   Diclofenac Other (See Comments)   Heparin Other (See Comments)    HIT antibody/SRA pending   Macrobid [Nitrofurantoin Macrocrystal] Hives   Mirtazapine Swelling    Patient Measurements: Height: 5\' 3"  (160 cm) Weight: 56.4 kg (124 lb 5.4 oz) IBW/kg (Calculated) : 52.4 Heparin Dosing Weight: 55.4  Vital Signs: Temp: 100 F (37.8 C) (10/23 1200) Temp Source: Bladder (10/23 1200) BP: 107/61 (10/23 1200) Pulse Rate: 111 (10/23 1200)  Labs: Recent Labs    07/30/23 0301 07/30/23 1327 07/31/23 0450 07/31/23 1203 07/31/23 1403 07/31/23 1600 08/01/23 0336  HGB 9.3*   < > 9.1* 8.8*  --   --  8.4*  HCT 28.6*   < > 28.2* 26.0*  --   --  26.5*  PLT 97*  --  65*  --   --   --  52*  APTT  --   --   --   --  74*  --  70*  CREATININE 1.23*   < > 1.15*  --   --  1.55* 2.22*   < > = values in this interval not displayed.    Estimated Creatinine Clearance: 20.9 mL/min (A) (by C-G formula based on SCr of 2.22 mg/dL (H)).   Medical History: Past Medical History:  Diagnosis Date   Alcoholic (HCC)    Arthritis    Hypertension    VIN III (vulvar intraepithelial neoplasia III)    Vulvar lesion     Medications:  Medications Prior to Admission  Medication Sig Dispense Refill Last Dose   acetaminophen (TYLENOL) 500 MG tablet Take 1,000 mg by mouth every 6 (six) hours as needed for moderate pain.      albuterol (VENTOLIN HFA) 108 (90 Base) MCG/ACT inhaler Inhale 2 puffs into the lungs 2 (two) times daily as needed for shortness of breath.      allopurinol (ZYLOPRIM) 100 MG tablet Take by mouth.      ALPRAZolam (XANAX) 0.5 MG tablet Take 0.5 mg by mouth 3 (three) times daily as needed for anxiety.       ascorbic acid (VITAMIN C) 500 MG tablet Take 500 mg by mouth every other day.      aspirin EC 325 MG tablet Take 325 mg by mouth  daily as needed (headaches).      B Complex Vitamins (B COMPLEX PO) Take 1 Dose by mouth every other day. Liquid b complex      cetirizine (ZYRTEC) 10 MG tablet Take 10 mg by mouth daily as needed for allergies.      Cholecalciferol (DIALYVITE VITAMIN D 5000) 125 MCG (5000 UT) capsule Take 5,000 Units by mouth every other day.      estradiol (VIVELLE-DOT) 0.1 MG/24HR Place 1 patch onto the skin 2 (two) times a week.      fluticasone (FLONASE) 50 MCG/ACT nasal spray Place 2 sprays into both nostrils daily.      folic acid (FOLVITE) 1 MG tablet       furosemide (LASIX) 40 MG tablet Take 1 tablet by mouth daily.      gabapentin (NEURONTIN) 300 MG capsule Take 300 mg by mouth at bedtime.      Glycerin-Polysorbate 80 (REFRESH DRY EYE THERAPY OP) Place 1 drop into both eyes daily as needed (dry eyes).      levocetirizine (XYZAL) 5 MG tablet Take 5 mg by mouth  at bedtime.      Magnesium 400 MG CAPS Take 400 mg by mouth every other day.      Menthol-Methyl Salicylate (SALONPAS PAIN RELIEF PATCH EX) Apply 1-3 patches topically daily as needed (pain).      metoprolol succinate (TOPROL-XL) 25 MG 24 hr tablet Take 1 tablet by mouth daily.      OVER THE COUNTER MEDICATION Take 6 tablets by mouth daily as needed (constipation). Tam herbal laxative      pantoprazole (PROTONIX) 40 MG tablet Take 40 mg by mouth See admin instructions. Take 40 mg daily may take a second 40 mg dose as needed for heartburn      potassium chloride (KLOR-CON) 10 MEQ tablet Take 10 mEq by mouth daily.      Probiotic CAPS Take 1 capsule by mouth every other day.      ramipril (ALTACE) 2.5 MG capsule Take 2.5 mg by mouth daily.      rosuvastatin (CRESTOR) 5 MG tablet Take 5 mg by mouth daily.      torsemide (DEMADEX) 20 MG tablet Take 20-40 mg by mouth See admin instructions. Take 20 or 40 mg once daily in the morning      TRELEGY ELLIPTA 100-62.5-25 MCG/ACT AEPB Inhale 1 puff into the lungs daily.      trolamine salicylate  (ASPERCREME) 10 % cream Apply 1 application topically as needed for muscle pain.      Turmeric Curcumin 500 MG CAPS Take 500 mg by mouth every other day.      valACYclovir (VALTREX) 1000 MG tablet Take 500-1,000 mg by mouth See admin instructions. Take 500 mg daily, may take 1000 mg instead if currently breaking out      vitamin E 180 MG (400 UNITS) capsule Take 400 Units by mouth every other day.      Wheat Dextrin (BENEFIBER DRINK MIX) PACK Take 4 g by mouth at bedtime.      Zinc 50 MG CAPS Take 50 mg by mouth every other day.      Scheduled:   calcium carbonate  1 tablet Per Tube BID   Chlorhexidine Gluconate Cloth  6 each Topical Daily   docusate  100 mg Per Tube BID   feeding supplement (PROSource TF20)  60 mL Per Tube BID   folic acid  1 mg Per Tube Daily   insulin aspart  0-15 Units Subcutaneous Q4H   lactulose  30 g Per Tube TID   lidocaine  10 mL Intradermal Once   multivitamin  1 tablet Per Tube QHS   pantoprazole (PROTONIX) IV  40 mg Intravenous Q24H   polyethylene glycol  17 g Per Tube Daily   potassium chloride  20 mEq Oral Once   [START ON 08/02/2023] predniSONE  40 mg Oral Q breakfast   Followed by   Melene Muller ON 08/03/2023] predniSONE  30 mg Oral Q breakfast   Followed by   Melene Muller ON 08/04/2023] predniSONE  20 mg Oral Q breakfast   Followed by   Melene Muller ON 08/05/2023] predniSONE  10 mg Oral Q breakfast   sodium chloride flush  10-40 mL Intracatheter Q12H   thiamine  100 mg Oral Daily   Or   thiamine  100 mg Intravenous Daily   Infusions:    prismasol BGK 4/2.5 400 mL/hr at 07/30/23 2300    prismasol BGK 4/2.5 400 mL/hr at 07/30/23 2300   bivalirudin (ANGIOMAX) 250 mg in sodium chloride 0.9 % 500 mL (0.5 mg/mL) infusion 0.06 mg/kg/hr (08/01/23  1000)   piperacillin-tazobactam (ZOSYN)  IV 3.375 g (08/01/23 1214)   prismasol BGK 4/2.5 1,000 mL/hr at 07/31/23 0400   PRN: docusate sodium, ondansetron (ZOFRAN) IV, mouth rinse, polyethylene glycol, sodium chloride, sodium  chloride flush  Assessment: 67 YOF arrived at [facility] ED altered and hypotensive and was admitted for treatment of septic shock. Patient was previously receiving SQ heparin for DVT ppx (last dose 10/19 at 1400). Platelets have progressively decreased over the past 5 days (227 >150 >124 >97> 65). With concerns for HITT, we ordered HIT antibody and SRA and started therapeutic AC with bivalirudin.   aPTT is on the higher end of therapeutic at 70 on bivalirudin 0.08 mg/kg/our. CBC remains stable with no overt s/sx of bleeding or bruising.  Goal of Therapy:  aPTT 50-85 seconds Monitor platelets by anticoagulation protocol: Yes   Plan:  Reduce bivalirudin to 0.06 mg/kg/hour Check aPTT later today at 1400 Monitor daily aPTT, CBC, and s/sx of bleeding  Wilmer Floor, PharmD PGY2 Cardiology Pharmacy Resident 08/01/2023,12:48 PM  UPDATE 15:48 08/01/23: aPTT at 13:49 is therapeutic at 67 on 0.06 mg/kg/hour. No changes in signs/symptoms of bleeding/bruising. Continue bivalirudin at 0.06 mg/kg/hour.

## 2023-08-01 NOTE — Progress Notes (Signed)
CSW received consult from RN. Patients sister request to speak with CSW. CSW spoke with patients sister wanda. All questions answered. CSW informed patients sister  TOC awaiting PT/OT recommendations. Will follow up in regards to dc planning needs closer to patient being medically ready. No further questions reported at this time.

## 2023-08-01 NOTE — Plan of Care (Signed)

## 2023-08-01 NOTE — Progress Notes (Signed)
NAME:  Carol Rowe, MRN:  [id number], DOB:  [birth year], LOS: 5 ADMISSION DATE:  07/27/2023 CHIEF COMPLAINT:  Shock   History of Present Illness:  This is a case of a 67 year old female patient with a past medical history of alcohol use disorder, alcoholic liver cirrhosis, essential hypertension and presumed COPD presenting to [facility] as a transfer from [facility] for shock.  History was mainly taken from the patient at bedside and chart dissection.  She initially presented to [facility] on 10/18 after a family member went over to her house and found her down.  She apparently was walking to the bathroom and fell down and struck her head.  EMS were called by family members and on arrival she was found down hypoglycemic at the 30s.  She was given IV dextrose and transferred to [facility].  Last known normal 6 days ago.  In the ED she was found to be hypotensive, altered with left-sided focal deficits.  Therefore a code stroke was called and a stat CT head was obtained which was nonrevealing.  Neurology was consulted and felt less likely to be stroke as no focal weakness was present on their exam.  Felt this was secondary to metabolic encephalopathy.  Furthermore, labs were initially concerning for leukocytosis with a WBC count of 15.2.  Hyponatremia with a sodium of 122 and severe hypokalemia with a potassium of 2.0.  Also noted to have severe AKI with a creatinine of 6.84 no previous labs on file to determine baseline. Lactic acidosis also noted at 4.3. She was given 3 L normal saline and started on norepinephrine.  Blood culture was drawn and given broad-spectrum antibiotics.  Also central line was placed at [facility].  Chest x-ray with no focal consolidation.  Transferred here for further management.  On encounter. Patient A&O x3 to self time and place. 1L LR given and again noted to be hypoglycemic requiring another D50 push. She was on 12 mcg/min NE. A-line placed. Continue  on broad spec abx pending culture data. Remained Anuric, CT A/P pending.  Bedside echocardiogram with normal EF. RV not enlarge and no pericardial effusion. Repeat labs with normalized lactic acid.   Interval history:    Extubated yesterday.  Still confused occasionally vomiting.  Has received as needed Zofran.  Objective   Blood pressure 104/63, pulse (!) 105, temperature 99.7 F (37.6 C), resp. rate 14, height 5\' 3"  (1.6 m), weight 56.4 kg, SpO2 92%. CVP:  [5 mmHg-15 mmHg] 5 mmHg  Vent Mode: PSV;CPAP Set Rate:  [40 bmp] 40 bmp PEEP:  [5 cmH20] 5 cmH20 Pressure Support:  [10 cmH20] 10 cmH20 Intake/Output Summary (Last 24 hours) at 08/01/2023 0820 Last data filed at 08/01/2023 0301 Gross per 24 hour  Intake 367.83 ml  Output 181 ml  Net 186.83 ml   Filed Weights   07/30/23 0422 07/31/23 0500 08/01/23 0400  Weight: 60.1 kg 55.4 kg 56.4 kg    Examination: General: Chronically ill-appearing, responsive to name Neuro: Right arm is weaker than the left arm, she will move her left but not her right HEENT: Dry mouth, right gaze preference has resolved, pupils are equal Lungs: Diminished in the bases no wheeze Abdomen: Soft nontender nondistended Heart: Regular rhythm S1-S2 Skin: Scattered petechia   Assessment & Plan:   Septic shock, Likely RLL pneumonia. Blood cultures remain negative.  Plan: Continue to follow white blood cell count, on steroids, taper these off Complete course of Zosyn, today is day  8 we will stop Zosyn  Anemia; stable H&H stable, continue to follow  Thrombocytopenia- intermediate 4T score, will switch to bival, HIT panel and SRA pending Plan: Remains on bivalirudin  AKI, oliguric Hyponatremia hypokalemia AGMA; worsening Hypophosphatemia - P: Renal replacement per nephrology, follow I's and O's  Hypocalcemia -on Tums  Acute respiratory failure with hypoxia requiring MV H/o ETOH abuse Cirrhosis, acutely decompensated. MELD-Na+ 29 Mild  coagulopathy Mild hyperammonemia Encephalopathy- query sedation R/o stroke- initial head ct neg Plan: Will repeat head CT If needed will plan for MRI with ongoing encephalopathy and right-sided weakness. May need to consider EEG as well.  As to not miss nonconvulsive status.  Hypoglycemia, resolved, now hyperglycemic -con't accuchecks, not requiring insulin -goal BG <180  Moderate protein energy malnutrition -TF, vitamins   Best Practice (right click and "Reselect all SmartList Selections" daily)   Diet/type: tubefeeds DVT prophylaxis: argatroban GI prophylaxis: PPI Lines: Central line Foley:  Yes, and it is still needed Code Status:  full code   This patient is critically ill with multiple organ system failure; which, requires frequent high complexity decision making, assessment, support, evaluation, and titration of therapies. This was completed through the application of advanced monitoring technologies and extensive interpretation of multiple databases. During this encounter critical care time was devoted to patient care services described in this note for 32 minutes.  Josephine Igo, DO [facility] 08/01/2023 8:20 AM

## 2023-08-01 NOTE — Progress Notes (Signed)
EEG complete - results pending 

## 2023-08-01 NOTE — Progress Notes (Signed)
Assessment/Plan: 67 year old WF with cirrhosis, COPD found down.  Unknown renal function baseline but now oligo/anuric AKI  1.Renal- oligoanuric AKI -  likely due to profound hypotension on an ACE , mild rhabdo with ck of over 500.  Made a little urine, 100 of protein but mostly c/Rowe UTI -  imaging does not indicate hydro.  Due to acidosis needed to support with CRRT -  started on 10/19-10/22, rt fem temp  No e/o renal recovery at this time; would like to continue holding renal replacement but will need to restart in the next 48-72 hrs if UOP doesn't pick up. No absolute indications to restart for today.  Urine from foley a little more clear  but volume not increasing much.  2. Hypertension/volume  - appears to have septic shock although no source identified, possibly UTI -  no obvious abnormalities on bedside echo-  pressors/zosyn and vanc per CCM. Pressors off now.  3. Hypokalemia-  found down, poor nutrition  4. Metabolic acidosis-  corrected   4. Anemia  - could be due to GIB, CKD-  interestingly went from 8's to 10's without intervention -> 8.4.     Subjective:  CRRT off on 10/22.  off pressors and extubated.  Objective Vital signs in last 24 hours: Vitals:   08/01/23 0600 08/01/23 0700 08/01/23 0800 08/01/23 0900  BP: 104/63 106/72 107/73 118/74  Pulse: (!) 105 (!) 108 (!) 116 (!) 113  Resp: 14 17 17 17   Temp: 99.7 F (37.6 C) 100 F (37.8 C) 100.2 F (37.9 C) 100 F (37.8 C)  TempSrc:   Bladder   SpO2: 92% 93% 93% 93%  Weight:      Height:       Weight change: 1 kg  Intake/Output Summary (Last 24 hours) at 08/01/2023 1120 Last data filed at 08/01/2023 1000 Gross per 24 hour  Intake 361.76 ml  Output 1360 ml  Net -998.24 ml     Carol Rowe    Labs: Basic Metabolic Panel: Recent Labs  Lab 07/31/23 0450 07/31/23 1203 07/31/23 1600 08/01/23 0336  NA 136 136 137 137  K 4.2 4.5 3.8 3.8  CL 100  --  100 98  CO2 23  --  26 24  GLUCOSE 123*  --  83 112*   BUN 11  --  17 28*  CREATININE 1.15*  --  1.55* 2.22*  CALCIUM 8.1*  --  8.0* 8.4*  PHOS 2.7  --  1.8* 3.4   Liver Function Tests: Recent Labs  Lab 07/27/23 2201 07/28/23 1508 07/31/23 0450 07/31/23 1600 08/01/23 0336  AST 67*  --   --   --   --   ALT 32  --   --   --   --   ALKPHOS 208*  --   --   --   --   BILITOT 1.0  --   --   --   --   PROT 6.3*  --   --   --   --   ALBUMIN 3.2*   < > 2.0* 1.9* 2.1*   < > = values in this interval not displayed.   No results for input(s): "LIPASE", "AMYLASE" in the last 168 hours. Recent Labs  Lab 07/28/23 1725 07/31/23 0840  AMMONIA 50* 24   CBC: Recent Labs  Lab 07/27/23 2201 07/28/23 0234 07/28/23 1855 07/29/23 0340 07/29/23 0859 07/30/23 0301 07/30/23 1327 07/31/23 0450 07/31/23 1203 08/01/23 0336  WBC 15.2*   < >  14.7* 14.3*  --  31.8*  --  29.1*  --  15.2*  NEUTROABS 14.3*  --   --   --   --   --   --   --   --   --   HGB 8.8*   < > 9.2* 9.3*   < > 9.3*   < > 9.1* 8.8* 8.4*  HCT 27.3*   < > 27.5* 27.2*   < > 28.6*   < > 28.2* 26.0* 26.5*  MCV 103.0*   < > 96.8 96.5  --  101.4*  --  101.1*  --  101.5*  PLT 227   < > 150 124*  --  97*  --  65*  --  52*   < > = values in this interval not displayed.   Cardiac Enzymes: Recent Labs  Lab 07/27/23 2201  CKTOTAL 519*   CBG: Recent Labs  Lab 07/31/23 1635 07/31/23 2007 07/31/23 2348 08/01/23 0336 08/01/23 0831  GLUCAP 89 96 97 118* 93    Iron Studies: No results for input(s): "IRON", "TIBC", "TRANSFERRIN", "FERRITIN" in the last 72 hours. Studies/Results: DG CHEST PORT 1 VIEW  Result Date: 07/31/2023 CLINICAL DATA:  Hypoxia EXAM: PORTABLE CHEST 1 VIEW COMPARISON:  07/28/2023 FINDINGS: No significant change in AP portable chest radiograph. Support apparatus including endotracheal tube, esophagogastric tube, and right neck vascular catheter unchanged. Heart size normal. Unchanged layering pleural effusions and associated atelectasis or consolidation. No new  airspace opacity. No acute osseous findings. IMPRESSION: 1. No significant change in AP portable chest radiograph. 2. Unchanged layering pleural effusions and associated atelectasis or consolidation. No new airspace opacity. 3. Support apparatus unchanged. Electronically Signed   By: Jearld Lesch M.D.   On: 07/31/2023 11:42   Medications: Infusions:   prismasol BGK 4/2.5 400 mL/hr at 07/30/23 2300    prismasol BGK 4/2.5 400 mL/hr at 07/30/23 2300   bivalirudin (ANGIOMAX) 250 mg in sodium chloride 0.9 % 500 mL (0.5 mg/mL) infusion 0.06 mg/kg/hr (08/01/23 1000)   piperacillin-tazobactam (ZOSYN)  IV     prismasol BGK 4/2.5 1,000 mL/hr at 07/31/23 0400    Scheduled Medications:  calcium carbonate  1 tablet Per Tube BID   Chlorhexidine Gluconate Cloth  6 each Topical Daily   docusate  100 mg Per Tube BID   feeding supplement (PROSource TF20)  60 mL Per Tube BID   folic acid  1 mg Per Tube Daily   hydrocortisone sod succinate (SOLU-CORTEF) inj  50 mg Intravenous Q12H   Followed by   Melene Muller ON 08/02/2023] predniSONE  40 mg Oral Q breakfast   Followed by   Melene Muller ON 08/03/2023] predniSONE  30 mg Oral Q breakfast   Followed by   Melene Muller ON 08/04/2023] predniSONE  20 mg Oral Q breakfast   Followed by   Melene Muller ON 08/05/2023] predniSONE  10 mg Oral Q breakfast   insulin aspart  0-15 Units Subcutaneous Q4H   lactulose  30 g Per Tube TID   lidocaine  10 mL Intradermal Once   multivitamin  1 tablet Per Tube QHS   pantoprazole (PROTONIX) IV  40 mg Intravenous Q24H   polyethylene glycol  17 g Per Tube Daily   potassium chloride  20 mEq Oral Once   sodium chloride flush  10-40 mL Intracatheter Q12H   thiamine  100 mg Oral Daily   Or   thiamine  100 mg Intravenous Daily    have reviewed scheduled and prn medications.  Physical Exam:  General:extubated, confused but pleasant, able to state name but little else Heart:RRR Lungs: CBS bilat Abdomen: slight distention Extremities: pitting edema   Dialysis Access: fem HD cath placed 10/19    08/01/2023,11:20 AM  LOS: 5 days

## 2023-08-01 NOTE — Procedures (Signed)
Patient Name: Carol Rowe  MRN: [id number]  Epilepsy Attending: Charlsie Quest  Referring Physician/Provider: Josephine Igo, DO  Date: 07/30/2023 Duration: 22.45 mins  Patient history: 67yo F with ams getting eeg to evaluate for seizure  Level of alertness: Awake  AEDs during EEG study: None  Technical aspects: This EEG study was done with scalp electrodes positioned according to the 10-20 International system of electrode placement. Electrical activity was reviewed with band pass filter of 1-70Hz , sensitivity of 7 uV/mm, display speed of 65mm/sec with a 60Hz  notched filter applied as appropriate. EEG data were recorded continuously and digitally stored.  Video monitoring was available and reviewed as appropriate.  Description: EEG showed continuous generalized 3 to 6 Hz theta-delta slowing, at times with triphasic morphology. Hyperventilation and photic stimulation were not performed.     ABNORMALITY - Continuous slow, generalized  IMPRESSION: This study is suggestive of moderate diffuse encephalopathy. No seizures or epileptiform discharges were seen throughout the recording.  Syrianna Schillaci Annabelle Harman

## 2023-08-02 DIAGNOSIS — E43 Unspecified severe protein-calorie malnutrition: Secondary | ICD-10-CM | POA: Insufficient documentation

## 2023-08-02 DIAGNOSIS — R579 Shock, unspecified: Secondary | ICD-10-CM | POA: Diagnosis not present

## 2023-08-02 LAB — CBC
HCT: 24.9 % — ABNORMAL LOW (ref 36.0–46.0)
HCT: 23.5 % — ABNORMAL LOW (ref 36.0–46.0)
Hemoglobin: 7.9 g/dL — ABNORMAL LOW (ref 12.0–15.0)
Hemoglobin: 7.4 g/dL — ABNORMAL LOW (ref 12.0–15.0)
MCH: 32.1 pg (ref 26.0–34.0)
MCH: 31.8 pg (ref 26.0–34.0)
MCHC: 31.7 g/dL (ref 30.0–36.0)
MCHC: 31.5 g/dL (ref 30.0–36.0)
MCV: 101.2 fL — ABNORMAL HIGH (ref 80.0–100.0)
MCV: 100.9 fL — ABNORMAL HIGH (ref 80.0–100.0)
Platelets: 70 10*3/uL — ABNORMAL LOW (ref 150–400)
Platelets: 84 10*3/uL — ABNORMAL LOW (ref 150–400)
RBC: 2.46 MIL/uL — ABNORMAL LOW (ref 3.87–5.11)
RBC: 2.33 MIL/uL — ABNORMAL LOW (ref 3.87–5.11)
RDW: 19.1 % — ABNORMAL HIGH (ref 11.5–15.5)
RDW: 19.1 % — ABNORMAL HIGH (ref 11.5–15.5)
WBC: 7 10*3/uL (ref 4.0–10.5)
WBC: 5.7 10*3/uL (ref 4.0–10.5)
nRBC: 1 % — ABNORMAL HIGH (ref 0.0–0.2)
nRBC: 3.5 % — ABNORMAL HIGH (ref 0.0–0.2)

## 2023-08-02 LAB — HEPATIC FUNCTION PANEL
ALT: 36 U/L (ref 0–44)
AST: 37 U/L (ref 15–41)
Albumin: 2.1 g/dL — ABNORMAL LOW (ref 3.5–5.0)
Alkaline Phosphatase: 143 U/L — ABNORMAL HIGH (ref 38–126)
Bilirubin, Direct: 0.7 mg/dL — ABNORMAL HIGH (ref 0.0–0.2)
Indirect Bilirubin: 1 mg/dL — ABNORMAL HIGH (ref 0.3–0.9)
Total Bilirubin: 1.7 mg/dL — ABNORMAL HIGH (ref 0.3–1.2)
Total Protein: 6 g/dL — ABNORMAL LOW (ref 6.5–8.1)

## 2023-08-02 LAB — GLUCOSE, CAPILLARY
Glucose-Capillary: 110 mg/dL — ABNORMAL HIGH (ref 70–99)
Glucose-Capillary: 114 mg/dL — ABNORMAL HIGH (ref 70–99)
Glucose-Capillary: 128 mg/dL — ABNORMAL HIGH (ref 70–99)
Glucose-Capillary: 105 mg/dL — ABNORMAL HIGH (ref 70–99)
Glucose-Capillary: 115 mg/dL — ABNORMAL HIGH (ref 70–99)
Glucose-Capillary: 85 mg/dL (ref 70–99)
Glucose-Capillary: 117 mg/dL — ABNORMAL HIGH (ref 70–99)

## 2023-08-02 LAB — SEROTONIN RELEASE ASSAY (SRA)
SRA .2 IU/mL UFH Ser-aCnc: <1 % (ref 0–20)
SRA 100IU/mL UFH Ser-aCnc: <1 % (ref 0–20)

## 2023-08-02 LAB — RENAL FUNCTION PANEL
Albumin: 2.1 g/dL — ABNORMAL LOW (ref 3.5–5.0)
Albumin: 2 g/dL — ABNORMAL LOW (ref 3.5–5.0)
Anion gap: 17 — ABNORMAL HIGH (ref 5–15)
Anion gap: 20 — ABNORMAL HIGH (ref 5–15)
BUN: 50 mg/dL — ABNORMAL HIGH (ref 8–23)
BUN: 54 mg/dL — ABNORMAL HIGH (ref 8–23)
CO2: 25 mmol/L (ref 22–32)
CO2: 24 mmol/L (ref 22–32)
Calcium: 8.9 mg/dL (ref 8.9–10.3)
Calcium: 8.8 mg/dL — ABNORMAL LOW (ref 8.9–10.3)
Chloride: 98 mmol/L (ref 98–111)
Chloride: 97 mmol/L — ABNORMAL LOW (ref 98–111)
Creatinine, Ser: 3.22 mg/dL — ABNORMAL HIGH (ref 0.44–1.00)
Creatinine, Ser: 3.39 mg/dL — ABNORMAL HIGH (ref 0.44–1.00)
GFR, Estimated: 15 mL/min — ABNORMAL LOW (ref >60)
GFR, Estimated: 14 mL/min — ABNORMAL LOW (ref >60)
Glucose, Bld: 120 mg/dL — ABNORMAL HIGH (ref 70–99)
Glucose, Bld: 115 mg/dL — ABNORMAL HIGH (ref 70–99)
Phosphorus: 3.5 mg/dL (ref 2.5–4.6)
Phosphorus: 5 mg/dL — ABNORMAL HIGH (ref 2.5–4.6)
Potassium: 3.8 mmol/L (ref 3.5–5.1)
Potassium: 3.6 mmol/L (ref 3.5–5.1)
Sodium: 140 mmol/L (ref 135–145)
Sodium: 141 mmol/L (ref 135–145)

## 2023-08-02 LAB — BASIC METABOLIC PANEL
Anion gap: 17 — ABNORMAL HIGH (ref 5–15)
BUN: 49 mg/dL — ABNORMAL HIGH (ref 8–23)
CO2: 24 mmol/L (ref 22–32)
Calcium: 8.9 mg/dL (ref 8.9–10.3)
Chloride: 98 mmol/L (ref 98–111)
Creatinine, Ser: 3.29 mg/dL — ABNORMAL HIGH (ref 0.44–1.00)
GFR, Estimated: 15 mL/min — ABNORMAL LOW (ref >60)
Glucose, Bld: 123 mg/dL — ABNORMAL HIGH (ref 70–99)
Potassium: 3.7 mmol/L (ref 3.5–5.1)
Sodium: 139 mmol/L (ref 135–145)

## 2023-08-02 LAB — HEMOGLOBIN AND HEMATOCRIT, BLOOD
HCT: 29.5 % — ABNORMAL LOW (ref 36.0–46.0)
Hemoglobin: 9.9 g/dL — ABNORMAL LOW (ref 12.0–15.0)

## 2023-08-02 LAB — ABO/RH: ABO/RH(D): O POS

## 2023-08-02 LAB — PREPARE RBC (CROSSMATCH)

## 2023-08-02 LAB — MAGNESIUM: Magnesium: 2.6 mg/dL — ABNORMAL HIGH (ref 1.7–2.4)

## 2023-08-02 LAB — LIPASE, BLOOD: Lipase: 120 U/L — ABNORMAL HIGH (ref 11–51)

## 2023-08-02 LAB — APTT: aPTT: 62 s — ABNORMAL HIGH (ref 24–36)

## 2023-08-02 MED ORDER — SPIRONOLACTONE 12.5 MG HALF TABLET
12.5000 mg | ORAL_TABLET | Freq: Every day | ORAL | Status: DC
  Administered 2023-08-02 – 2023-08-06 (×5): 12.5 mg via ORAL
  Filled 2023-08-02 (×5): qty 1

## 2023-08-02 MED ORDER — SENNA 8.6 MG PO TABS
1.0000 | ORAL_TABLET | Freq: Every day | ORAL | Status: DC | PRN
  Filled 2023-08-02: qty 1

## 2023-08-02 MED ORDER — PREDNISONE 20 MG PO TABS
20.0000 mg | ORAL_TABLET | Freq: Every day | ORAL | Status: AC
  Administered 2023-08-04: 20 mg
  Filled 2023-08-02: qty 1

## 2023-08-02 MED ORDER — PREDNISONE 20 MG PO TABS
10.0000 mg | ORAL_TABLET | Freq: Every day | ORAL | Status: AC
  Administered 2023-08-05: 10 mg
  Filled 2023-08-02: qty 1

## 2023-08-02 MED ORDER — PREDNISONE 20 MG PO TABS
40.0000 mg | ORAL_TABLET | Freq: Once | ORAL | Status: AC
  Administered 2023-08-02: 40 mg

## 2023-08-02 MED ORDER — PREDNISONE 20 MG PO TABS
30.0000 mg | ORAL_TABLET | Freq: Every day | ORAL | Status: AC
  Administered 2023-08-03: 30 mg
  Filled 2023-08-02: qty 2

## 2023-08-02 MED ORDER — ENOXAPARIN SODIUM 30 MG/0.3ML IJ SOSY
30.0000 mg | PREFILLED_SYRINGE | INTRAMUSCULAR | Status: DC
  Administered 2023-08-02: 30 mg via SUBCUTANEOUS
  Filled 2023-08-02: qty 0.3

## 2023-08-02 MED ORDER — ZINC SULFATE 220 (50 ZN) MG PO CAPS
220.0000 mg | ORAL_CAPSULE | Freq: Every day | ORAL | Status: DC
  Administered 2023-08-02 – 2023-08-05 (×4): 220 mg
  Filled 2023-08-02 (×4): qty 1

## 2023-08-02 MED ORDER — LACTULOSE 10 GM/15ML PO SOLN
20.0000 g | Freq: Two times a day (BID) | ORAL | Status: DC
  Administered 2023-08-02 – 2023-08-05 (×7): 20 g
  Filled 2023-08-02 (×8): qty 30

## 2023-08-02 MED ORDER — HYDROCORTISONE SOD SUC (PF) 100 MG IJ SOLR
INTRAMUSCULAR | Status: AC
  Administered 2023-08-02: 50 mg via INTRAVENOUS
  Filled 2023-08-02: qty 2

## 2023-08-02 MED ORDER — PANTOPRAZOLE SODIUM 40 MG IV SOLR
40.0000 mg | Freq: Two times a day (BID) | INTRAVENOUS | Status: DC
  Administered 2023-08-02 – 2023-08-05 (×7): 40 mg via INTRAVENOUS
  Filled 2023-08-02 (×7): qty 10

## 2023-08-02 MED ORDER — SODIUM CHLORIDE 0.9% IV SOLUTION: Freq: Once | INTRAVENOUS | Status: DC

## 2023-08-02 MED ORDER — ZINC SULFATE 220 (50 ZN) MG PO CAPS: 220.0000 mg | ORAL_CAPSULE | Freq: Every day | ORAL | Status: DC

## 2023-08-02 NOTE — Plan of Care (Signed)

## 2023-08-02 NOTE — Progress Notes (Signed)
NAME:  Carol Rowe, MRN:  [id number], DOB:  09/06/1956, LOS: 6 ADMISSION DATE:  07/27/2023 CHIEF COMPLAINT:  Shock   History of Present Illness:  This is a case of a 67 year old female patient with a past medical history of alcohol use disorder, alcoholic liver cirrhosis, essential hypertension and presumed COPD presenting to [facility] as a transfer from [facility] for shock.  History was mainly taken from the patient at bedside and chart dissection.  She initially presented to [facility] on 10/18 after a family member went over to her house and found her down.  She apparently was walking to the bathroom and fell down and struck her head.  EMS were called by family members and on arrival she was found down hypoglycemic at the 30s.  She was given IV dextrose and transferred to [facility].  Last known normal 6 days ago.  In the ED she was found to be hypotensive, altered with left-sided focal deficits.  Therefore a code stroke was called and a stat CT head was obtained which was nonrevealing.  Neurology was consulted and felt less likely to be stroke as no focal weakness was present on their exam.  Felt this was secondary to metabolic encephalopathy.  Furthermore, labs were initially concerning for leukocytosis with a WBC count of 15.2.  Hyponatremia with a sodium of 122 and severe hypokalemia with a potassium of 2.0.  Also noted to have severe AKI with a creatinine of 6.84 no previous labs on file to determine baseline. Lactic acidosis also noted at 4.3. She was given 3 L normal saline and started on norepinephrine.  Blood culture was drawn and given broad-spectrum antibiotics.  Also central line was placed at [facility].  Chest x-ray with no focal consolidation.  Transferred here for further management.  On encounter. Patient A&O x3 to self time and place. 1L LR given and again noted to be hypoglycemic requiring another D50 push. She was on 12 mcg/min NE. A-line placed. Continue  on broad spec abx pending culture data. Remained Anuric, CT A/P pending.  Bedside echocardiogram with normal EF. RV not enlarge and no pericardial effusion. Repeat labs with normalized lactic acid.   Interval history:    NGT in place. Confusion is better.  Objective   Blood pressure 101/66, pulse 96, temperature 99.7 F (37.6 C), resp. rate 14, height 5\' 3"  (1.6 m), weight 53.3 kg, SpO2 96%. CVP:  [2 mmHg-64 mmHg] 64 mmHg    Intake/Output Summary (Last 24 hours) at 08/02/2023 0826 Last data filed at 08/02/2023 0400 Gross per 24 hour  Intake 116.58 ml  Output 1655 ml  Net -1538.42 ml   Filed Weights   07/31/23 0500 08/01/23 0400 08/02/23 0500  Weight: 55.4 kg 56.4 kg 53.3 kg    Examination: General: Chronically ill-appearing lying in bed still confused Neuro: right arm weak, in comparison  HEENT: Dry mouth NG tube in place Lungs: Diminished in the bilateral bases no crackles no wheeze Abdomen: Soft nontender nondistended Heart: Regular rate rhythm S1-S2 Skin: Scattered petechia   Assessment & Plan:   Septic shock, Likely RLL pneumonia. Blood cultures remain negative.  Plan: Taper off steroids Follow for any other signs of infection Cultures negative completed 8-day course of Zosyn  Anemia; stable H&H stable, continue to follow  Thrombocytopenia Plan: HIT antibody negative Stop bivalirudin Start low molecular weight heparin DVT prophylaxis  AKI, oliguric Hyponatremia hypokalemia AGMA; worsening Hypophosphatemia - P: Off CVVHD at this time Follow urine  output and I's and O's  Hypocalcemia -on Tums  Acute respiratory failure with hypoxia requiring MV H/o ETOH abuse Cirrhosis, acutely decompensated. MELD-Na+ 29 Mild coagulopathy Mild hyperammonemia Encephalopathy- query sedation R/o stroke- initial head ct neg Plan: Discussed with neurology EEG reviewed, no seizure Stroke is old Start lactulose Start low-dose spironolactone  Hypoglycemia,  resolved, now hyperglycemic -con't accuchecks, not requiring insulin -goal BG <180  Moderate protein energy malnutrition -TF, vitamins   Best Practice (right click and "Reselect all SmartList Selections" daily)   Diet/type: tubefeeds DVT prophylaxis: argatroban GI prophylaxis: PPI Lines: Central line Foley:  Yes, and it is still needed Code Status:  full code    Josephine Igo, DO [facility] 08/02/2023 8:26 AM

## 2023-08-02 NOTE — Progress Notes (Signed)
CSW received consult for possible SNF placement at time of discharge. Due to patients current orientation CSW Lvm for patients son Carol Rowe. CSW awaiting call back to discuss PT recommendations/patients dc plan. CSW Carol Rowe continue to follow and assist with patients dc planning needs.

## 2023-08-02 NOTE — Progress Notes (Signed)
Nutrition Follow-up  DOCUMENTATION CODES:   Severe malnutrition in context of chronic illness  INTERVENTION:   Recommend initiation of TPN given inability to utilize GI tract and severe malnutrition. Inadequate nutrition x 6 days  Recommend enema and/or suppository given continued constipation  NUTRITION DIAGNOSIS:   Severe Malnutrition related to chronic illness as evidenced by severe fat depletion, severe muscle depletion.  Continues  GOAL:   Patient will meet greater than or equal to 90% of their needs  Not Met  MONITOR:   Vent status, Labs, Weight trends, I & O's, TF tolerance  REASON FOR ASSESSMENT:   Consult, Ventilator Enteral/tube feeding initiation and management (CRRT protocol)  ASSESSMENT:   Pt transferred from [facility] with diagnosis of shock. PMH significant for alcohol use disorder, alcoholic liver cirrhosis, essential HTN, presumed COPD.  10/18: admitted w/hypoglycemia, metabolic encephalopathy, severe AKI 10/19: intubated; non-TDC placed for CRRT initiation, TF started at 15 ml/hr 10/22 CRRT discontinued, Extubated 10/23: significant emesis, NG for decompression  NPO. Noted TF started at 15 ml/hjr on 10/19 and slowly titrated to goal with pt only receiving several hours of TF at goal prior to extubation NG continues with dark drainage, 1.2 L thus far today per RN, noted 1.5 L drainage yesterday after large volume emesis (total volume of emesis not measured) No BM since 10/19.Noted Reglan started yesterday, lactulose per tube. Un sure how much lactulose pt is absorbing due to significant NG output  Labs: reviewed Meds: ss novolog, lactulose, reglan, thiamine, folic acid, zinc sulfate, rena vite, prenisone  NUTRITION - FOCUSED PHYSICAL EXAM:  Flowsheet Row Most Recent Value  Orbital Region Severe depletion  Thoracic and Lumbar Region Unable to assess  Buccal Region Severe depletion  Temple Region Severe depletion  Clavicle Bone Region Severe depletion   Clavicle and Acromion Bone Region Severe depletion  Scapular Bone Region Severe depletion  Dorsal Hand Unable to assess  Patellar Region Severe depletion  Anterior Thigh Region Severe depletion  Posterior Calf Region Severe depletion  Edema (RD Assessment) --  [ascites]  Eyes Unable to assess  Mouth Unable to assess  Skin Reviewed  [ecchymosis, bruising, skin tears]  Nails Unable to assess       Diet Order:   Diet Order             Diet NPO time specified  Diet effective now                   EDUCATION NEEDS:   No education needs have been identified at this time  Skin:  Skin Assessment: Reviewed RN Assessment  Last BM:  unknown/PTA; distended/taut per RN  Height:   Ht Readings from Last 1 Encounters:  07/27/23 5\' 3"  (1.6 m)    Weight:   Wt Readings from Last 1 Encounters:  08/02/23 53.3 kg      BMI:  Body mass index is 20.82 kg/m.  Estimated Nutritional Needs:   Kcal:  1600-1800  Protein:  105-115g  Fluid:  1L + UOP  Romelle Starcher MS, RDN, LDN, CNSC Registered Dietitian 3 Clinical Nutrition RD Pager and On-Call Pager Number Located in [place]

## 2023-08-02 NOTE — Evaluation (Addendum)
Occupational Therapy Evaluation Patient Details Name: Carol Rowe MRN: [id number] DOB: Jul 09, 1956 Today's Date: 08/02/2023   History of Present Illness 67 yo female presents to ED on 10/18 found down s/p fall at home with hypoglycemia and hypotension. + head trauma but CTH negative for acute findings. Workup for shock. Pt admitted to ICU 10/19 for pressor support, intubation, ETT 10/19-10/22, CRRT 10/19-10/22. EEG 10/23 suggestive of moderate diffuse encephalopathy. Pt also with mild rhabdo. PMH includes ETOH abuse, cirrhosis, HTN, COPD, smoker, gout.   Clinical Impression   Upon eval, pt with AMS requiring yes/no questions and max difficulty with open-ended questions; questionable historian. Pt with poor sitting balance, debility, poor strength, activity tolerance and general malaise. Upon EOB, PT noticing bloody sheets with large blood clot; RN entering, present for clean up and aware.  Requiring max-total A for all ADL at this time secondary to mental status. Patient will benefit from continued inpatient follow up therapy, <3 hours/day       If plan is discharge home, recommend the following: Two people to help with walking and/or transfers;Two people to help with bathing/dressing/bathroom;Assistance with cooking/housework;Assist for transportation;Help with stairs or ramp for entrance;Direct supervision/assist for financial management;Direct supervision/assist for medications management;Assistance with feeding (max-total A)    Functional Status Assessment  Patient has had a recent decline in their functional status and demonstrates the ability to make significant improvements in function in a reasonable and predictable amount of time.  Equipment Recommendations  Other (comment) (defer)    Recommendations for Other Services       Precautions / Restrictions Precautions Precautions: Fall Precaution Comments: NGT, a-line LUE, CVP jugular Restrictions Weight Bearing Restrictions: No       Mobility Bed Mobility Overal bed mobility: Needs Assistance Bed Mobility: Supine to Sit, Sit to Supine, Rolling Rolling: Max assist   Supine to sit: Total assist, +2 for physical assistance Sit to supine: Total assist, +2 for physical assistance   General bed mobility comments: max-total assist +2 for all aspects, pt with difficulty with initiation and participation. Rolling bilat for pericare, pt's bed pads and sheets noted to be bloody with large blood clot, RN witnessed and aware    Transfers                   General transfer comment: unable to attempt      Balance Overall balance assessment: Needs assistance Sitting-balance support: Feet supported, Bilateral upper extremity supported Sitting balance-Leahy Scale: Poor Sitting balance - Comments: requiring posterior support                                   ADL either performed or assessed with clinical judgement   ADL Overall ADL's : Needs assistance/impaired     Grooming: Maximal assistance;Bed level   Upper Body Bathing: Maximal assistance;Bed level   Lower Body Bathing: Total assistance;Bed level   Upper Body Dressing : Maximal assistance;Bed level   Lower Body Dressing: Total assistance;Bed level   Toilet Transfer: Total assistance Toilet Transfer Details (indicate cue type and reason): bed level; significant bloody clotting                 Vision Patient Visual Report: Other (comment) (pt unable to report) Additional Comments: Pt unable to report or participate in formal visual testing.     Perception         Praxis  Pertinent Vitals/Pain Pain Assessment Pain Assessment: Faces Faces Pain Scale: Hurts even more Pain Location: "all over", especially with LE handling Pain Descriptors / Indicators: Discomfort, Guarding, Grimacing Pain Intervention(s): Limited activity within patient's tolerance, Monitored during session     Extremity/Trunk Assessment  Upper Extremity Assessment Upper Extremity Assessment: Generalized weakness   Lower Extremity Assessment Lower Extremity Assessment: Defer to PT evaluation   Cervical / Trunk Assessment Cervical / Trunk Assessment: Normal   Communication Communication Communication: No apparent difficulties Cueing Techniques: Verbal cues;Gestural cues;Tactile cues   Cognition Arousal: Lethargic Behavior During Therapy: Flat affect Overall Cognitive Status: No family/caregiver present to determine baseline cognitive functioning Area of Impairment: Orientation, Attention, Memory, Following commands, Safety/judgement, Problem solving, Awareness                 Orientation Level: Disoriented to, Place, Situation Current Attention Level: Focused Memory: Decreased short-term memory Following Commands: Follows one step commands with increased time, Follows one step commands inconsistently Safety/Judgement: Decreased awareness of safety, Decreased awareness of deficits Awareness: Intellectual Problem Solving: Slow processing, Decreased initiation, Requires tactile cues, Difficulty sequencing, Requires verbal cues General Comments: pt oriented to self and year, but states "yes" 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 Pt vocalizing in pain with a majority of mobility, unsure if true pain vs difficulty understanding cues and pt guarding as a result. Pt answers yes/no questions best given attentional deficits and slowed processing.     General Comments  VSS    Exercises     Shoulder Instructions      Home Living Family/patient expects to be discharged to:: Private residence Living Arrangements: Children (son?)   Type of Home: House Home Access: Stairs to enter Secretary/administrator of Steps: "few"   Home Layout: One level     Bathroom Shower/Tub: Producer, television/film/video: Standard     Home Equipment: Agricultural consultant (2 wheels);Shower seat   Additional Comments: unsure of reliability  of pt report      Prior Functioning/Environment Prior Level of Function : Patient poor historian/Family not available             Mobility Comments: unsure of pt baseline ADLs Comments: unsure of pt baseline        OT Problem List: Decreased strength;Decreased activity tolerance;Impaired balance (sitting and/or standing);Decreased safety awareness;Decreased cognition      OT Treatment/Interventions: Self-care/ADL training;Therapeutic exercise;DME and/or AE instruction;Balance training;Therapeutic activities;Cognitive remediation/compensation;Patient/family education    OT Goals(Current goals can be found in the care plan section) Acute Rehab OT Goals Patient Stated Goal: unable OT Goal Formulation: With patient Time For Goal Achievement: 08/16/23 Potential to Achieve Goals: Good  OT Frequency: Min 1X/week    Co-evaluation PT/OT/SLP Co-Evaluation/Treatment: Yes Reason for Co-Treatment: For patient/therapist safety;To address functional/ADL transfers;Complexity of the patient's impairments (multi-system involvement) PT goals addressed during session: Mobility/safety with mobility OT goals addressed during session: ADL's and self-care      AM-PAC OT "6 Clicks" Daily Activity     Outcome Measure Help from another person eating meals?: Total Help from another person taking care of personal grooming?: A Lot Help from another person toileting, which includes using toliet, bedpan, or urinal?: A Lot Help from another person bathing (including washing, rinsing, drying)?: A Lot Help from another person to put on and taking off regular upper body clothing?: A Lot Help from another person to put on and taking off regular lower body clothing?: Total 6 Click Score: 10   End of Session Nurse Communication:  Mobility status;Other (comment) (RN in room as pt with significant clotting blood coming from rectum)  Activity Tolerance: Patient limited by fatigue Patient left: in bed;with call  bell/phone within reach;with bed alarm set  OT Visit Diagnosis: Unsteadiness on feet (R26.81);Muscle weakness (generalized) (M62.81);Other symptoms and signs involving cognitive function                Time: 1050-1115 OT Time Calculation (min): 25 min Charges:  OT General Charges $OT Visit: 1 Visit OT Evaluation $OT Eval Moderate Complexity: 1 Mod  Tyler Deis, OTR/L [facility] Acute Rehabilitation Office: [phone]   Myrla Halsted 08/02/2023, 2:50 PM

## 2023-08-02 NOTE — Progress Notes (Signed)
Assessment/Plan: 67 year old WF with cirrhosis, COPD found down.  Unknown renal function baseline but now oligo/anuric AKI  1.Renal- oligoanuric AKI -  likely due to profound hypotension on an ACE , mild rhabdo with ck of over 500.  100 of protein but mostly c/Rowe UTI -  imaging does not indicate hydro.  Due to acidosis needed to support with CRRT -  started on 10/19-10/22, rt fem temp  No e/o renal recovery at this time; would like to continue holding renal replacement but may  need to restart in the next 48-72 hrs. UOP actually starting to pick up but will need to trend (still oliguric range). No absolute indications to restart for today and will try to hold as long as we can.  2. Hypertension/volume  - had septic shock although no source identified, possibly UTI -  no obvious abnormalities on bedside echo-  pressors/zosyn and vanc per CCM. Pressors off now.  3. Hypokalemia-  found down, poor nutrition  4. Metabolic acidosis-  corrected   4. Anemia  - could be due to GIB, CKD-  interestingly went from 8's to 10's without intervention -> 8.4.     Subjective:  CRRT off on 10/22.  off pressors and extubated. PLeasant and answering questions appropriately  Objective Vital signs in last 24 hours: Vitals:   08/02/23 0329 08/02/23 0330 08/02/23 0400 08/02/23 0500  BP:      Pulse: 87 91 91 96  Resp: 15 13 14 14   Temp: 99.7 F (37.6 C) 99.7 F (37.6 C)  99.7 F (37.6 C)  TempSrc:      SpO2: (!) 86% 95% 97% 96%  Weight:    53.3 kg  Height:       Weight change: -3.1 kg  Intake/Output Summary (Last 24 hours) at 08/02/2023 1001 Last data filed at 08/02/2023 0400 Gross per 24 hour  Intake 103.3 ml  Output 320 ml  Net -216.7 ml     Carol Rowe    Labs: Basic Metabolic Panel: Recent Labs  Lab 08/01/23 0336 08/01/23 1538 08/02/23 0500  NA 137 140 139  140  K 3.8 3.4* 3.7  3.8  CL 98 100 98  98  CO2 24 23 24  25   GLUCOSE 112* 120* 123*  120*  BUN 28* 41* 49*  50*   CREATININE 2.22* 2.69* 3.29*  3.22*  CALCIUM 8.4* 8.1* 8.9  8.9  PHOS 3.4 2.8 3.5   Liver Function Tests: Recent Labs  Lab 07/27/23 2201 07/28/23 1508 08/01/23 0336 08/01/23 1538 08/02/23 0500  AST 67*  --   --   --  37  ALT 32  --   --   --  36  ALKPHOS 208*  --   --   --  143*  BILITOT 1.0  --   --   --  1.7*  PROT 6.3*  --   --   --  6.0*  ALBUMIN 3.2*   < > 2.1* 1.9* 2.1*  2.1*   < > = values in this interval not displayed.   Recent Labs  Lab 08/02/23 0500  LIPASE 120*   Recent Labs  Lab 07/28/23 1725 07/31/23 0840  AMMONIA 50* 24   CBC: Recent Labs  Lab 07/27/23 2201 07/28/23 0234 07/29/23 0340 07/29/23 0859 07/30/23 0301 07/30/23 1327 07/31/23 0450 07/31/23 1203 08/01/23 0336 08/02/23 0500  WBC 15.2*   < > 14.3*  --  31.8*  --  29.1*  --  15.2* 7.0  NEUTROABS 14.3*  --   --   --   --   --   --   --   --   --   HGB 8.8*   < > 9.3*   < > 9.3*   < > 9.1* 8.8* 8.4* 7.9*  HCT 27.3*   < > 27.2*   < > 28.6*   < > 28.2* 26.0* 26.5* 24.9*  MCV 103.0*   < > 96.5  --  101.4*  --  101.1*  --  101.5* 101.2*  PLT 227   < > 124*  --  97*  --  65*  --  52* 70*   < > = values in this interval not displayed.   Cardiac Enzymes: Recent Labs  Lab 07/27/23 2201  CKTOTAL 519*   CBG: Recent Labs  Lab 08/01/23 1538 08/01/23 2021 08/02/23 0016 08/02/23 0258 08/02/23 0810  GLUCAP 106* 131* 114* 110* 128*    Iron Studies: No results for input(s): "IRON", "TIBC", "TRANSFERRIN", "FERRITIN" in the last 72 hours. Studies/Results: EEG adult  Result Date: 08/01/2023 Charlsie Quest, MD     08/01/2023  5:36 PM Patient Name: Carol Rowe MRN: [id number] Epilepsy Attending: Charlsie Quest Referring Physician/Provider: Josephine Igo, DO Date: 07/30/2023 Duration: 22.45 mins Patient history: 67yo F with ams getting eeg to evaluate for seizure Level of alertness: Awake AEDs during EEG study: None Technical aspects: This EEG study was done with scalp electrodes  positioned according to the 10-20 International system of electrode placement. Electrical activity was reviewed with band pass filter of 1-70Hz , sensitivity of 7 uV/mm, display speed of 75mm/sec with a 60Hz  notched filter applied as appropriate. EEG data were recorded continuously and digitally stored.  Video monitoring was available and reviewed as appropriate. Description: EEG showed continuous generalized 3 to 6 Hz theta-delta slowing, at times with triphasic morphology. Hyperventilation and photic stimulation were not performed.   ABNORMALITY - Continuous slow, generalized IMPRESSION: This study is suggestive of moderate diffuse encephalopathy. No seizures or epileptiform discharges were seen throughout the recording. Charlsie Quest   DG Abd 1 View  Result Date: 08/01/2023 CLINICAL DATA:  Nasogastric tube placement. EXAM: ABDOMEN - 1 VIEW COMPARISON:  CT earlier today FINDINGS: Tip and side port of the enteric tube below the diaphragm in the stomach. Dilated loop of small bowel in the left abdomen as seen on CT. IMPRESSION: Tip and side port of the enteric tube below the diaphragm in the stomach. Electronically Signed   By: Narda Rutherford M.D.   On: 08/01/2023 14:48   CT ABDOMEN PELVIS WO CONTRAST  Result Date: 08/01/2023 CLINICAL DATA:  Bowel obstruction suspected EXAM: CT ABDOMEN AND PELVIS WITHOUT CONTRAST TECHNIQUE: Multidetector CT imaging of the abdomen and pelvis was performed following the standard protocol without IV contrast. RADIATION DOSE REDUCTION: This exam was performed according to the departmental dose-optimization program which includes automated exposure control, adjustment of the mA and/or kV according to patient size and/or use of iterative reconstruction technique. COMPARISON:  07/28/2023 FINDINGS: Lower chest: Small bilateral pleural effusions and associated atelectasis or consolidation, increased compared to prior examination. Three-vessel coronary artery calcifications.  Hepatobiliary: No focal liver abnormality is seen. Hepatic steatosis. Status post cholecystectomy. No biliary dilatation. Pancreas: Unremarkable. No pancreatic ductal dilatation or surrounding inflammatory changes. Spleen: Normal in size without significant abnormality. Adrenals/Urinary Tract: Adrenal glands are unremarkable. Kidneys are normal, without renal calculi, solid lesion, or hydronephrosis. Foley catheter in the urinary bladder. Stomach/Bowel: Esophagogastric tube with  tip and side port below the diaphragm, tip in the vicinity of the pylorus. Fluid-filled, mildly distended loops of small bowel throughout the central abdomen, measuring up to 4.2 cm in the central abdomen (series 3, image 61). There is some mildly tethered appearing and narrowed loops in the right hemiabdomen (series 3, image 67), however there is no clear transition point, and there is a significant volume of gas and fluid present in the proximal colon, the distal colon remaining fairly decompressed. Stomach is otherwise within normal limits. Appendix appears normal. Sigmoid diverticulosis. Vascular/Lymphatic: Severe aortic atherosclerosis. No enlarged abdominal or pelvic lymph nodes. Reproductive: Status post hysterectomy. Other: No abdominal wall hernia or abnormality. No ascites. Musculoskeletal: No acute or significant osseous findings. IMPRESSION: 1. Fluid-filled, mildly distended loops of small bowel throughout the central abdomen, measuring up to 4.2 cm in the central abdomen. There are some mildly tethered appearing and narrowed loops in the right hemiabdomen, however there is no clear transition point, and there is a significant volume of gas and fluid present in the proximal colon. Findings are most consistent with ileus, partial or developing distal small bowel obstruction not excluded. 2. Small bilateral pleural effusions and associated atelectasis or consolidation, increased compared to prior examination. 3. Hepatic steatosis.  4. Coronary artery disease. Aortic Atherosclerosis (ICD10-I70.0). Electronically Signed   By: Jearld Lesch M.D.   On: 08/01/2023 13:18   CT HEAD WO CONTRAST ( )  Result Date: 08/01/2023 CLINICAL DATA:  Mental status change, persistent or worsening. EXAM: CT HEAD WITHOUT CONTRAST TECHNIQUE: Contiguous axial images were obtained from the base of the skull through the vertex without intravenous contrast. RADIATION DOSE REDUCTION: This exam was performed according to the departmental dose-optimization program which includes automated exposure control, adjustment of the mA and/or kV according to patient size and/or use of iterative reconstruction technique. COMPARISON:  None Available. FINDINGS: Brain: No evidence of acute infarction, hemorrhage, hydrocephalus, extra-axial collection or mass lesion/mass effect. Remote right cerebellar infarct. Confluent hypodensity of the periventricular white matter, nonspecific, most likely related to chronic microangiopathy. Mild parenchymal volume loss. Vascular: Calcified plaques in the bilateral carotid siphons. Skull: Normal. Negative for fracture or focal lesion. Sinuses/Orbits: No acute finding. Other: None. IMPRESSION: 1. No acute intracranial abnormality. 2. Remote right cerebellar infarct. 3. Chronic microangiopathy. Electronically Signed   By: Baldemar Lenis M.D.   On: 08/01/2023 12:11   Medications: Infusions:   prismasol BGK 4/2.5 400 mL/hr at 07/30/23 2300    prismasol BGK 4/2.5 400 mL/hr at 07/30/23 2300   prismasol BGK 4/2.5 1,000 mL/hr at 07/31/23 0400    Scheduled Medications:  bisacodyl  10 mg Rectal Daily   calcium carbonate  1 tablet Per Tube BID   chewing gum (ORBIT) sugar free  1 Stick Oral TID   Chlorhexidine Gluconate Cloth  6 each Topical Daily   enoxaparin (LOVENOX) injection  30 mg Subcutaneous Q24H   folic acid  1 mg Per Tube Daily   free water  100 mL Per Tube Q6H   insulin aspart  0-15 Units Subcutaneous Q4H    lactulose  20 g Per Tube BID   lidocaine  10 mL Intradermal Once   metoCLOPramide (REGLAN) injection  5 mg Intravenous Q8H   multivitamin  1 tablet Per Tube QHS   pantoprazole (PROTONIX) IV  40 mg Intravenous Q24H   polyethylene glycol  17 g Per Tube Daily   predniSONE  40 mg Oral Q breakfast   Followed by   Melene Muller ON 08/03/2023]  predniSONE  30 mg Oral Q breakfast   Followed by   Melene Muller ON 08/04/2023] predniSONE  20 mg Oral Q breakfast   Followed by   Melene Muller ON 08/05/2023] predniSONE  10 mg Oral Q breakfast   sodium chloride flush  10-40 mL Intracatheter Q12H   spironolactone  12.5 mg Oral Daily   thiamine  100 mg Oral Daily   Or   thiamine  100 mg Intravenous Daily   zinc sulfate  220 mg Per Tube Daily    have reviewed scheduled and prn medications.  Physical Exam: General:extubated, pleasant Heart:RRR Lungs: CBS bilat Abdomen: slight distention Extremities: pitting edema  Dialysis Access: Rt fem HD cath placed 10/19    08/02/2023,10:01 AM  LOS: 6 days

## 2023-08-02 NOTE — Evaluation (Signed)
Physical Therapy Evaluation Patient Details Name: Carol Rowe MRN: [id number] DOB: [birth year] Today's Date: 08/02/2023  History of Present Illness  67 yo female presents to ED on 10/18 found down s/p fall at home with hypoglycemia and hypotension. + head trauma but CTH negative for acute findings. Workup for shock. Pt admitted to ICU 10/19 for pressor support, intubation, ETT 10/19-10/22, CRRT 10/19-10/22. EEG 10/23 suggestive of moderate diffuse encephalopathy. Pt also with mild rhabdo. PMH includes ETOH abuse, cirrhosis, HTN, COPD, smoker, gout.  Clinical Impression   Pt presents with debility, poor to zero sitting balance, max difficulty performing bed mobility tasks, body-wide pain particularly lower legs bilat, AMS,and decreased activity tolerance. Pt to benefit from acute PT to address deficits. Pt requiring max-total +2 assist for moving to/from EOB, pt's bed pads and sheets noted to be bloody with large blood clot, RN witnessed and aware and rest of evaluation terminated at that time. PT to progress mobility as tolerated, and will continue to follow acutely.      vss    If plan is discharge home, recommend the following: Two people to help with walking and/or transfers;Two people to help with bathing/dressing/bathroom   Can travel by private vehicle        Equipment Recommendations None recommended by PT  Recommendations for Other Services       Functional Status Assessment Patient has had a recent decline in their functional status and demonstrates the ability to make significant improvements in function in a reasonable and predictable amount of time.     Precautions / Restrictions Precautions Precautions: Fall Precaution Comments: NGT, a-line LUE, CVP jugular Restrictions Weight Bearing Restrictions: No      Mobility  Bed Mobility Overal bed mobility: Needs Assistance Bed Mobility: Supine to Sit, Sit to Supine, Rolling Rolling: Max assist   Supine to sit: Total  assist, +2 for physical assistance Sit to supine: Total assist, +2 for physical assistance   General bed mobility comments: max-total assist +2 for all aspects, pt with difficulty with initiation and participation. Rolling bilat for pericare, pt's bed pads and sheets noted to be bloody with large blood clot, RN witnessed and aware    Transfers                   General transfer comment: unable to attempt    Ambulation/Gait                  Stairs            Wheelchair Mobility     Tilt Bed    Modified Rankin (Stroke Patients Only)       Balance Overall balance assessment: Needs assistance Sitting-balance support: Feet supported, Bilateral upper extremity supported Sitting balance-Leahy Scale: Poor Sitting balance - Comments: requiring posterior support                                     Pertinent Vitals/Pain Pain Assessment Pain Assessment: Faces Faces Pain Scale: Hurts even more Pain Location: "all over", especially with LE handling Pain Descriptors / Indicators: Discomfort, Guarding, Grimacing Pain Intervention(s): Limited activity within patient's tolerance, Monitored during session, Repositioned    Home Living Family/patient expects to be discharged to:: Private residence Living Arrangements: Alone   Type of Home: House Home Access: Stairs to enter   [employer] of Steps: "few"   Home Layout: One level Home Equipment: Agricultural consultant (2  wheels);Shower seat      Prior Function Prior Level of Function : Patient poor historian/Family not available             Mobility Comments: unsure of pt baseline ADLs Comments: unsure of pt baseline     Extremity/Trunk Assessment   Upper Extremity Assessment Upper Extremity Assessment: Defer to OT evaluation    Lower Extremity Assessment Lower Extremity Assessment: Generalized weakness    Cervical / Trunk Assessment Cervical / Trunk Assessment: Normal   Communication   Communication Communication: No apparent difficulties Cueing Techniques: Verbal cues;Gestural cues  Cognition Arousal: Lethargic Behavior During Therapy: Flat affect Overall Cognitive Status: No family/caregiver present to determine baseline cognitive functioning Area of Impairment: Orientation, Attention, Memory, Following commands, Safety/judgement, Problem solving                 Orientation Level: Disoriented to, Place, Situation Current Attention Level: Focused Memory: Decreased short-term memory Following Commands: Follows one step commands with increased time, Follows one step commands inconsistently Safety/Judgement: Decreased awareness of safety, Decreased awareness of deficits   Problem Solving: Slow processing, Decreased initiation, Requires tactile cues, Difficulty sequencing, Requires verbal cues General Comments: pt oriented to self and year, but states "yes" 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 Pt vocalizing in pain with a majority of mobility, unsure if true pain vs difficulty understanding cues and pt guarding as a result. Pt answers yes/no questions best given attentional deficits and slowed processing.        General Comments General comments (skin integrity, edema, etc.): vss    Exercises     Assessment/Plan    PT Assessment Patient needs continued PT services  PT Problem List Decreased strength;Decreased mobility;Decreased activity tolerance;Decreased balance;Decreased knowledge of use of DME;Pain;Cardiopulmonary status limiting activity;Decreased safety awareness;Decreased cognition;Decreased skin integrity       PT Treatment Interventions DME instruction;Gait training;Therapeutic exercise;Patient/family education;Therapeutic activities;Balance training;Stair training;Functional mobility training;Neuromuscular re-education    PT Goals (Current goals can be found in the Care Plan section)  Acute Rehab PT Goals PT Goal Formulation:  Patient unable to participate in goal setting Time For Goal Achievement: 08/16/23 Potential to Achieve Goals: Good    Frequency Min 1X/week     Co-evaluation PT/OT/SLP Co-Evaluation/Treatment: Yes Reason for Co-Treatment: For patient/therapist safety;To address functional/ADL transfers;Complexity of the patient's impairments (multi-system involvement)           AM-PAC PT "6 Clicks" Mobility  Outcome Measure Help needed turning from your back to your side while in a flat bed without using bedrails?: A Lot Help needed moving from lying on your back to sitting on the side of a flat bed without using bedrails?: Total Help needed moving to and from a bed to a chair (including a wheelchair)?: Total Help needed standing up from a chair using your arms (e.g., wheelchair or bedside chair)?: Total Help needed to walk in hospital room?: Total Help needed climbing 3-5 steps with a railing? : Total 6 Click Score: 7    End of Session   Activity Tolerance: Patient limited by fatigue;Treatment limited secondary to medical complications (Comment) (blood on bed pads, fatigue) Patient left: in bed;with call bell/phone within reach;with bed alarm set;with nursing/sitter in room;Other (comment) (mitts donned) Nurse Communication: Mobility status PT Visit Diagnosis: Other abnormalities of gait and mobility (R26.89);Muscle weakness (generalized) (M62.81)    Time: 1610-9604 PT Time Calculation (min) (ACUTE ONLY): 25 min   Charges:   PT Evaluation $PT Eval Low Complexity: 1 Low   PT General  Charges $$ ACUTE PT VISIT: 1 Visit         Marye Round, PT DPT Acute Rehabilitation Services Secure Chat Preferred  Office [phone]   Truddie Coco 08/02/2023, 2:14 PM

## 2023-08-03 ENCOUNTER — Inpatient Hospital Stay (HOSPITAL_COMMUNITY): Payer: Medicare HMO

## 2023-08-03 DIAGNOSIS — K922 Gastrointestinal hemorrhage, unspecified: Secondary | ICD-10-CM | POA: Diagnosis not present

## 2023-08-03 DIAGNOSIS — J9601 Acute respiratory failure with hypoxia: Secondary | ICD-10-CM | POA: Diagnosis not present

## 2023-08-03 DIAGNOSIS — D696 Thrombocytopenia, unspecified: Secondary | ICD-10-CM

## 2023-08-03 DIAGNOSIS — E43 Unspecified severe protein-calorie malnutrition: Secondary | ICD-10-CM

## 2023-08-03 DIAGNOSIS — A419 Sepsis, unspecified organism: Secondary | ICD-10-CM | POA: Diagnosis not present

## 2023-08-03 DIAGNOSIS — E440 Moderate protein-calorie malnutrition: Secondary | ICD-10-CM

## 2023-08-03 DIAGNOSIS — R579 Shock, unspecified: Secondary | ICD-10-CM | POA: Diagnosis not present

## 2023-08-03 LAB — CBC
HCT: 30.9 % — ABNORMAL LOW (ref 36.0–46.0)
Hemoglobin: 10.3 g/dL — ABNORMAL LOW (ref 12.0–15.0)
MCH: 32.4 pg (ref 26.0–34.0)
MCHC: 33.3 g/dL (ref 30.0–36.0)
MCV: 97.2 fL (ref 80.0–100.0)
Platelets: 130 10*3/uL — ABNORMAL LOW (ref 150–400)
RBC: 3.18 MIL/uL — ABNORMAL LOW (ref 3.87–5.11)
RDW: 19.9 % — ABNORMAL HIGH (ref 11.5–15.5)
WBC: 9.3 10*3/uL (ref 4.0–10.5)
nRBC: 0.6 % — ABNORMAL HIGH (ref 0.0–0.2)

## 2023-08-03 LAB — C DIFFICILE (CDIFF) QUICK SCRN (NO PCR REFLEX)
C Diff antigen: NEGATIVE
C Diff interpretation: NOT DETECTED
C Diff toxin: NEGATIVE

## 2023-08-03 LAB — RENAL FUNCTION PANEL
Albumin: 2.1 g/dL — ABNORMAL LOW (ref 3.5–5.0)
Albumin: 1.9 g/dL — ABNORMAL LOW (ref 3.5–5.0)
Anion gap: 20 — ABNORMAL HIGH (ref 5–15)
Anion gap: 17 — ABNORMAL HIGH (ref 5–15)
BUN: 58 mg/dL — ABNORMAL HIGH (ref 8–23)
BUN: 59 mg/dL — ABNORMAL HIGH (ref 8–23)
CO2: 25 mmol/L (ref 22–32)
CO2: 23 mmol/L (ref 22–32)
Calcium: 8.8 mg/dL — ABNORMAL LOW (ref 8.9–10.3)
Calcium: 8.4 mg/dL — ABNORMAL LOW (ref 8.9–10.3)
Chloride: 99 mmol/L (ref 98–111)
Chloride: 105 mmol/L (ref 98–111)
Creatinine, Ser: 3.41 mg/dL — ABNORMAL HIGH (ref 0.44–1.00)
Creatinine, Ser: 3.14 mg/dL — ABNORMAL HIGH (ref 0.44–1.00)
GFR, Estimated: 14 mL/min — ABNORMAL LOW (ref >60)
GFR, Estimated: 16 mL/min — ABNORMAL LOW (ref >60)
Glucose, Bld: 100 mg/dL — ABNORMAL HIGH (ref 70–99)
Glucose, Bld: 151 mg/dL — ABNORMAL HIGH (ref 70–99)
Phosphorus: 5.1 mg/dL — ABNORMAL HIGH (ref 2.5–4.6)
Phosphorus: 5.8 mg/dL — ABNORMAL HIGH (ref 2.5–4.6)
Potassium: 3.2 mmol/L — ABNORMAL LOW (ref 3.5–5.1)
Potassium: 3.3 mmol/L — ABNORMAL LOW (ref 3.5–5.1)
Sodium: 144 mmol/L (ref 135–145)
Sodium: 145 mmol/L (ref 135–145)

## 2023-08-03 LAB — TYPE AND SCREEN
ABO/RH(D): O POS
Antibody Screen: NEGATIVE
Unit division: 0

## 2023-08-03 LAB — BPAM RBC
Blood Product Expiration Date: 202411202359
ISSUE DATE / TIME: 202410241945
Unit Type and Rh: 5100

## 2023-08-03 LAB — HEMOGLOBIN AND HEMATOCRIT, BLOOD
HCT: 28.6 % — ABNORMAL LOW (ref 36.0–46.0)
HCT: 29.5 % — ABNORMAL LOW (ref 36.0–46.0)
HCT: 30.3 % — ABNORMAL LOW (ref 36.0–46.0)
Hemoglobin: 10.1 g/dL — ABNORMAL LOW (ref 12.0–15.0)
Hemoglobin: 9.5 g/dL — ABNORMAL LOW (ref 12.0–15.0)
Hemoglobin: 9.8 g/dL — ABNORMAL LOW (ref 12.0–15.0)

## 2023-08-03 LAB — GLUCOSE, CAPILLARY
Glucose-Capillary: 82 mg/dL (ref 70–99)
Glucose-Capillary: 87 mg/dL (ref 70–99)
Glucose-Capillary: 98 mg/dL (ref 70–99)
Glucose-Capillary: 141 mg/dL — ABNORMAL HIGH (ref 70–99)
Glucose-Capillary: 137 mg/dL — ABNORMAL HIGH (ref 70–99)

## 2023-08-03 LAB — PROCALCITONIN: Procalcitonin: 6.41 ng/mL

## 2023-08-03 LAB — MAGNESIUM: Magnesium: 2.4 mg/dL (ref 1.7–2.4)

## 2023-08-03 MED ORDER — DEXTROSE-SODIUM CHLORIDE 5-0.45 % IV SOLN: INTRAVENOUS | Status: DC

## 2023-08-03 MED ORDER — CARMEX CLASSIC LIP BALM EX OINT: TOPICAL_OINTMENT | CUTANEOUS | Status: DC | PRN

## 2023-08-03 MED ORDER — POTASSIUM CHLORIDE 10 MEQ/100ML IV SOLN
10.0000 meq | INTRAVENOUS | Status: AC
Start: 2023-08-03 — End: 2023-08-03
  Administered 2023-08-03 (×3): 10 meq via INTRAVENOUS
  Filled 2023-08-03: qty 100

## 2023-08-03 MED ORDER — DEXTROSE-SODIUM CHLORIDE 5-0.45 % IV SOLN: INTRAVENOUS | Status: AC

## 2023-08-03 MED ORDER — POTASSIUM CHLORIDE 10 MEQ/50ML IV SOLN
10.0000 meq | INTRAVENOUS | Status: DC
  Administered 2023-08-03: 10 meq via INTRAVENOUS
  Filled 2023-08-03: qty 50

## 2023-08-03 MED ORDER — POTASSIUM CHLORIDE 20 MEQ PO PACK
20.0000 meq | PACK | Freq: Once | ORAL | Status: AC
  Administered 2023-08-03: 20 meq
  Filled 2023-08-03: qty 1

## 2023-08-03 NOTE — Progress Notes (Signed)
PROGRESS NOTE    Shelbi Juszczak Goren  GEX:[id number] DOB: September 19, 1956 DOA: 07/27/2023 PCP: Benita Stabile, MD   Brief Narrative: Carol Rowe is a 67 y.o. female with a history of primary hypertension, COPD, alcohol use disorder, hyperlipidemia.  Patient presented secondary to septic shock from [facility].  Patient admitted to ICU for vasopressor support in addition to need for intubation and mechanical ventilation.  Patient treated with antibiotics for pneumonia.  Hospitalization was complicated by development of AKI requiring initiation of CRRT while in ICU.  Hospitalization further complicated by encephalopathy and ileus.  Patient with NG tube.   Assessment and Plan:  Septic shock Present on admission. Presumed secondary to right lower lobe pneumonia. Patient admitted to the ICU for vasopressor support and stress dose steroids. Blood cultures obtained and empiric antibiotics started. Vasopressors weaned off on 10/22.  Right lower lobe pneumonia Patient completed treatment with Zosyn.  Acute respiratory failure with hypoxia Patient required intubation and mechanical ventilation from 10/19 until 10/22. Patient now on supplemental oxygen via nasal canula. -Wean to room air as able  Fever Tmax of 101.7 F. WBC is still. Patient has a recent pneumonia which was treated. Patient with bloody stools which could indicate GI infectious source. Procalcitonin obtained and is elevated at 6.41 -GI panel -Blood cultures  Thrombocytopenia Likely reactive and secondary to sepsis. Platelets are recovering.  GI bleeding New diagnosis. Patient has required blood transfusion. Red/maroon blood with clots noted. GI consulted with concern for possible infectious vs ischemic colitis. Low suspicion for upper GI bleeding. C. Difficile obtained and is negative. -Follow-up GI pathogen panel  Chronic anemia Acute blood loss anemia Likely secondary to GI bleeding. Patient has required a total of 1 unit of  PRBC via transfusion.  AKI Patient was oliguric this admission. Likely related to ATN from profound hypotension from septic shock while on an ACEi. Nephrology consulted and initiated CRRT while patient was in the ICU on 10/19, which was discontinued on 10/22 via right femoral temporary HD catheter. -Nephrology recommendations  Hyponatremia Severe. Resolved with IV fluids and management through CRRT  Hypokalemia -Supplementation  High anion gap metabolic acidosis In relation to AKI. Improved with improvement of AKI.  Hypophosphatemia Resolved.  Hypocalcemia Resolved.  Possible cirrhosis Diagnosis does not appear to be accurate. No cirrhosis on CT imaging.  Coagulopathy In setting of acute illness. Question of underlying liver disease.  Hyperammonemia In setting of question of underlying liver disease. Ammonia level within normal limits.  Possible toxic encephalopathy Per chart review, concern encephalopathy was secondary to sedation. CT head negative for acute process. EEG obtained and was negative for evidence of seizure. Encephalopathy improving.  Moderate protein calorie malnutrition NG tube placed in ICU and patient started on tube feeds which were discontinued on 10/21 secondary to development of ileus.   DVT prophylaxis: SCDs Code Status:   Code Status: Full Code Family Communication: None at bedside. Called son and sister with no response Disposition Plan: Transfer to progressive floor   Consultants:  P[facility]M Nephrology [facility]  Procedures:    Antimicrobials: Vancomycin Zosyn    Subjective: Patient reports no concerns this morning. No nausea. No abdominal pain. Multiple bowel movements with brown/red stools noted from overnight.  Blood clots noted as well.  Objective: BP 112/69   Pulse (!) 110   Temp (!) 101.3 F (38.5 C)   Resp 19   Ht 5\' 3"  (1.6 m)   Wt 49.4 kg   SpO2 95%   BMI 19.29  kg/m   Examination:  General exam:  Appears calm and comfortable Respiratory system: Clear to auscultation. Respiratory effort normal. Cardiovascular system: S1 & S2 heard, RRR. Gastrointestinal system: Abdomen is distended, soft and mildly tender in periumbilical and right quadrants. Normal bowel sounds heard. Central nervous system: Alert and oriented to person and time. Musculoskeletal: No edema. No calf tenderness   Data Reviewed: I have personally reviewed following labs and imaging studies  CBC Lab Results  Component Value Date   WBC 9.3 08/03/2023   RBC 3.18 (L) 08/03/2023   HGB 10.3 (L) 08/03/2023   HCT 30.9 (L) 08/03/2023   MCV 97.2 08/03/2023   MCH 32.4 08/03/2023   PLT 130 (L) 08/03/2023   MCHC 33.3 08/03/2023   RDW 19.9 (H) 08/03/2023   LYMPHSABS 0.5 (L) 07/27/2023   MONOABS 0.3 07/27/2023   EOSABS 0.0 07/27/2023   BASOSABS 0.0 07/27/2023     Last metabolic panel Lab Results  Component Value Date   NA 144 08/03/2023   K 3.2 (L) 08/03/2023   CL 99 08/03/2023   CO2 25 08/03/2023   BUN 58 (H) 08/03/2023   CREATININE 3.41 (H) 08/03/2023   GLUCOSE 100 (H) 08/03/2023   GFRNONAA 14 (L) 08/03/2023   GFRAA >60 09/03/2017   CALCIUM 8.8 (L) 08/03/2023   PHOS 5.1 (H) 08/03/2023   PROT 6.0 (L) 08/02/2023   ALBUMIN 2.1 (L) 08/03/2023   BILITOT 1.7 (H) 08/02/2023   ALKPHOS 143 (H) 08/02/2023   AST 37 08/02/2023   ALT 36 08/02/2023   ANIONGAP 20 (H) 08/03/2023    GFR: Estimated Creatinine Clearance: 12.8 mL/min (A) (by C-G formula based on SCr of 3.41 mg/dL (H)).  Recent Results (from the past 240 hour(s))  Blood culture (routine x 2)     Status: None   Collection Time: 07/27/23 10:38 PM   Specimen: BLOOD RIGHT HAND  Result Value Ref Range Status   Specimen Description BLOOD RIGHT HAND BLOOD  Final   Special Requests   Final    BOTTLES DRAWN AEROBIC AND ANAEROBIC Blood Culture adequate volume   Culture   Final    NO GROWTH 5 DAYS Performed at [facility], [address] [place], [place]  [address]    Report Status 08/01/2023 FINAL  Final  Blood culture (routine x 2)     Status: None   Collection Time: 07/27/23 10:49 PM   Specimen: Left Antecubital; Blood  Result Value Ref Range Status   Specimen Description LEFT ANTECUBITAL BLOOD  Final   Special Requests   Final    BOTTLES DRAWN AEROBIC AND ANAEROBIC Blood Culture results may not be optimal due to an excessive volume of blood received in culture bottles   Culture   Final    NO GROWTH 5 DAYS Performed at [facility], [address], [place], [place] [address]    Report Status 08/01/2023 FINAL  Final  MRSA Next Gen by PCR, Nasal     Status: None   Collection Time: 07/28/23  2:16 AM   Specimen: Nasal Mucosa; Nasal Swab  Result Value Ref Range Status   MRSA by PCR Next Gen NOT DETECTED NOT DETECTED Final    Comment: (NOTE) The GeneXpert MRSA Assay (FDA approved for NASAL specimens only), is one component of a comprehensive MRSA colonization surveillance program. It is not intended to diagnose MRSA infection nor to guide or monitor treatment for MRSA infections. Test performance is not FDA approved in patients less than 33 years old. Performed at [facility]  [facility], [address], [place], [place] [address]   Culture, Respiratory w Gram Stain     Status: None   Collection Time: 07/29/23  4:13 PM   Specimen: Tracheal Aspirate; Respiratory  Result Value Ref Range Status   Specimen Description TRACHEAL ASPIRATE  Final   Special Requests NONE  Final   Gram Stain   Final    NO WBC SEEN RARE BUDDING YEAST SEEN Performed at [facility], [address], [place], [place] [address]    Culture FEW CANDIDA ALBICANS  Final   Report Status 07/31/2023 FINAL  Final      Radiology Studies: EEG adult  Result Date: 08-20-23 Charlsie Quest, MD     2023-08-20  5:36 PM Patient Name: Carol Rowe MRN: [id number] Epilepsy Attending: Charlsie Quest Referring Physician/Provider: Josephine Igo, DO Date: 07/30/2023  Duration: 22.45 mins Patient history: 67yo F with ams getting eeg to evaluate for seizure Level of alertness: Awake AEDs during EEG study: None Technical aspects: This EEG study was done with scalp electrodes positioned according to the 10-20 International system of electrode placement. Electrical activity was reviewed with band pass filter of 1-70Hz , sensitivity of 7 uV/mm, display speed of 89mm/sec with a 60Hz  notched filter applied as appropriate. EEG data were recorded continuously and digitally stored.  Video monitoring was available and reviewed as appropriate. Description: EEG showed continuous generalized 3 to 6 Hz theta-delta slowing, at times with triphasic morphology. Hyperventilation and photic stimulation were not performed.   ABNORMALITY - Continuous slow, generalized IMPRESSION: This study is suggestive of moderate diffuse encephalopathy. No seizures or epileptiform discharges were seen throughout the recording. Charlsie Quest   DG Abd 1 View  Result Date: August 20, 2023 CLINICAL DATA:  Nasogastric tube placement. EXAM: ABDOMEN - 1 VIEW COMPARISON:  CT earlier today FINDINGS: Tip and side port of the enteric tube below the diaphragm in the stomach. Dilated loop of small bowel in the left abdomen as seen on CT. IMPRESSION: Tip and side port of the enteric tube below the diaphragm in the stomach. Electronically Signed   By: Narda Rutherford M.D.   On: 08-20-2023 14:48   CT ABDOMEN PELVIS WO CONTRAST  Result Date: August 20, 2023 CLINICAL DATA:  Bowel obstruction suspected EXAM: CT ABDOMEN AND PELVIS WITHOUT CONTRAST TECHNIQUE: Multidetector CT imaging of the abdomen and pelvis was performed following the standard protocol without IV contrast. RADIATION DOSE REDUCTION: This exam was performed according to the departmental dose-optimization program which includes automated exposure control, adjustment of the mA and/or kV according to patient size and/or use of iterative reconstruction technique.  COMPARISON:  07/28/2023 FINDINGS: Lower chest: Small bilateral pleural effusions and associated atelectasis or consolidation, increased compared to prior examination. Three-vessel coronary artery calcifications. Hepatobiliary: No focal liver abnormality is seen. Hepatic steatosis. Status post cholecystectomy. No biliary dilatation. Pancreas: Unremarkable. No pancreatic ductal dilatation or surrounding inflammatory changes. Spleen: Normal in size without significant abnormality. Adrenals/Urinary Tract: Adrenal glands are unremarkable. Kidneys are normal, without renal calculi, solid lesion, or hydronephrosis. Foley catheter in the urinary bladder. Stomach/Bowel: Esophagogastric tube with tip and side port below the diaphragm, tip in the vicinity of the pylorus. Fluid-filled, mildly distended loops of small bowel throughout the central abdomen, measuring up to 4.2 cm in the central abdomen (series 3, image 61). There is some mildly tethered appearing and narrowed loops in the right hemiabdomen (series 3, image 67), however there is no clear transition point, and there is a significant volume  of gas and fluid present in the proximal colon, the distal colon remaining fairly decompressed. Stomach is otherwise within normal limits. Appendix appears normal. Sigmoid diverticulosis. Vascular/Lymphatic: Severe aortic atherosclerosis. No enlarged abdominal or pelvic lymph nodes. Reproductive: Status post hysterectomy. Other: No abdominal wall hernia or abnormality. No ascites. Musculoskeletal: No acute or significant osseous findings. IMPRESSION: 1. Fluid-filled, mildly distended loops of small bowel throughout the central abdomen, measuring up to 4.2 cm in the central abdomen. There are some mildly tethered appearing and narrowed loops in the right hemiabdomen, however there is no clear transition point, and there is a significant volume of gas and fluid present in the proximal colon. Findings are most consistent with ileus,  partial or developing distal small bowel obstruction not excluded. 2. Small bilateral pleural effusions and associated atelectasis or consolidation, increased compared to prior examination. 3. Hepatic steatosis. 4. Coronary artery disease. Aortic Atherosclerosis (ICD10-I70.0). Electronically Signed   By: Jearld Lesch M.D.   On: 08/01/2023 13:18   CT HEAD WO CONTRAST ( )  Result Date: 08/01/2023 CLINICAL DATA:  Mental status change, persistent or worsening. EXAM: CT HEAD WITHOUT CONTRAST TECHNIQUE: Contiguous axial images were obtained from the base of the skull through the vertex without intravenous contrast. RADIATION DOSE REDUCTION: This exam was performed according to the departmental dose-optimization program which includes automated exposure control, adjustment of the mA and/or kV according to patient size and/or use of iterative reconstruction technique. COMPARISON:  None Available. FINDINGS: Brain: No evidence of acute infarction, hemorrhage, hydrocephalus, extra-axial collection or mass lesion/mass effect. Remote right cerebellar infarct. Confluent hypodensity of the periventricular white matter, nonspecific, most likely related to chronic microangiopathy. Mild parenchymal volume loss. Vascular: Calcified plaques in the bilateral carotid siphons. Skull: Normal. Negative for fracture or focal lesion. Sinuses/Orbits: No acute finding. Other: None. IMPRESSION: 1. No acute intracranial abnormality. 2. Remote right cerebellar infarct. 3. Chronic microangiopathy. Electronically Signed   By: Baldemar Lenis M.D.   On: 08/01/2023 12:11      LOS: 7 days    Jacquelin Hawking, MD [facility] 08/03/2023, 7:41 AM   If 7PM-7AM, please contact night-coverage [email or web address]

## 2023-08-03 NOTE — TOC Initial Note (Signed)
Transition of Care [facility]) - Initial/Assessment Note    Patient Details  Name: Carol Rowe MRN: [id number] Date of Birth: 1956-07-30  Transition of Care [facility]) CM/SW Contact:    Delilah Shan, LCSWA Phone Number: 08/03/2023, 2:41 PM  Clinical Narrative:                  CSW received consult for possible SNF placement at time of discharge. Due to patients current orientation CSW spoke with patients sister Burna Mortimer regarding PT recommendation of SNF placement at time of discharge. Patients sister expressed understanding of PT recommendation and is agreeable to SNF placement at time of discharge for patient. Patients sister gave Permission for CSW to fax out referral for SNF. CSW informed patients sister that CSW will follow to fax out for SNF closer to patient being medically ready for dc. CSW discussed insurance authorization process.  No further questions reported at this time. CSW to continue to follow and assist with discharge planning needs.    Expected Discharge Plan: Skilled Nursing Facility Barriers to Discharge: Continued Medical Work up   Patient Goals and CMS Choice     Choice offered to / list presented to : Sibling (Patients sister Burna Mortimer)      Expected Discharge Plan and Services In-house Referral: Clinical Social Work Discharge Planning Services: CM Consult   Living arrangements for the past 2 months: Single Family Home                                      Prior Living Arrangements/Services Living arrangements for the past 2 months: Single Family Home Lives with:: Self Patient language and need for interpreter reviewed:: Yes Do you feel safe going back to the place where you live?: No   SNF  Need for Family Participation in Patient Care: Yes (Comment) Care giver support system in place?: Yes (comment)   Criminal Activity/Legal Involvement Pertinent to Current Situation/Hospitalization: No - Comment as needed  Activities of Daily Living       Permission Sought/Granted Permission sought to share information with : Case Manager, Magazine features editor, Family Supports Permission granted to share information with : Yes, Verbal Permission Granted  Share Information with NAME: Will Riess  Permission granted to share info w AGENCY: SNF  Permission granted to share info w Relationship: son  Permission granted to share info w Contact Information: [phone]  Emotional Assessment   Attitude/Demeanor/Rapport: Intubated (Following Commands or Not Following Commands), Other (comment)   Orientation: : Oriented to Self, Oriented to  Time Alcohol / Substance Use: Not Applicable Psych Involvement: No (comment)  Admission diagnosis:  Hypokalemia [E87.6] Anuria [R34] Hyponatremia [E87.1] Shock (HCC) [R57.9] Weakness of left arm [R29.898] Acute encephalopathy [G93.40] Hypothermia, initial encounter [T68.XXXA] Acute renal failure, unspecified acute renal failure type (HCC) [N17.9] Septic shock (HCC) [A41.9, R65.21] Patient Active Problem List   Diagnosis Date Noted   Lower GI bleed 08/03/2023   Protein-calorie malnutrition, severe 08/02/2023   Septic shock (HCC) 07/28/2023   Shock (HCC) 07/27/2023   Special screening for malignant neoplasms, colon 09/29/2019   Family hx of colon cancer 09/29/2019   Leukocytosis 09/03/2017   Thrombocytosis 09/03/2017   Chest tightness or pressure 08/29/2012   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 [place] [address] Phone: [phone] Fax:  [phone]     Social Determinants of Health (SDOH) Social History: SDOH Screenings   Tobacco Use: High Risk (07/27/2023)   SDOH Interventions:     Readmission Risk Interventions     No data to display

## 2023-08-03 NOTE — Hospital Course (Addendum)
Carol Rowe is a 67 y.o. female with a history of primary hypertension, COPD, alcohol use disorder, hyperlipidemia.  Patient presented secondary to septic shock from [facility].  Patient admitted to ICU for vasopressor support in addition to need for intubation and mechanical ventilation.  Patient treated with antibiotics for pneumonia.  Hospitalization was complicated by development of AKI requiring initiation of CRRT while in ICU.  Hospitalization further complicated by encephalopathy and ileus.  Patient with NG tube.

## 2023-08-03 NOTE — Progress Notes (Signed)
eLink Physician-Brief Progress Note Patient Name: Carol Rowe DOB: 1956-06-25 MRN: [id number]   Date of Service  08/03/2023  HPI/Events of Note  Patient has had a number of bloody stools, BP is unchanged, patient alert and oriented, she has been placed of bid Protonix and serial H & H, GI was consulted and indicated they will see patient in the morning, NG tube so far with bilious, non-bloody aspirate per bedside RN.  eICU Interventions  Will check a stat H & H to assess bleeding .        Thomasene Lot Carol Rowe 08/03/2023, 12:49 AM

## 2023-08-03 NOTE — Consult Note (Signed)
Consultation  Referring Provider: Dr. Caleb Popp     Primary Care Physician:  Benita Stabile, MD Primary Gastroenterologist: Dr. Karilyn Cota   -unassigned here     Reason for Consultation: Rectal bleeding and anemia              HPI:   Carol Rowe is a 67 y.o. female with a past medical history as listed below including alcohol use disorder, cirrhosis and presumed COPD who presented to the hospital as a transfer from [facility] for shock on 07/27/2023, we are called now in regards to rectal bleeding and anemia.    At time of presentation patient initially seen and pin after family member went over to her house and found her down, apparently fell and struck her head.  She was found to be hypoglycemic in the 30s.  In the ED found to be hypotensive and altered with left-sided focal deficits, code stroke called and stat CT head was nonrevealing.  This is all felt secondary to metabolic encephalopathy.  Labs concerning for leukocytosis with a WBC count of 15.2, hyponatremia with a sodium of 122 and severe hypokalemia with potassium of 2.  Severe AKI with a creatinine of 6.84.  Lactic acidosis noted at 4.3.  Started on bot spectrum antibiotics.  Transferred to [facility].    10/23 CT of the abdomen pelvis without contrast showed fluid-filled, mildly distended loops of small bowel throughout the central abdomen measuring up to 4.2 cm in the central abdomen with mildly tethered appearing and narrowed loops in the right hemiabdomen, no clear transition point with significant volume of gas and fluid present in the proximal colon, most consistent with ileus/partial or developing distal small bowel obstruction.  Small bilateral pleural effusions and associated atelectasis or consolidation.  Hepatic steatosis and CAD.    10/24 notes per critical care.  At that time plan to taper off steroids in the setting of septic shock and likely right lower lobe pneumonia.  Stable anemia.  Thrombocytopenia.  AKI, hyponatremia,  hypokalemia.  Patient remains his encephalopathy, started on Lactulose and low-dose Spironolactone on 10/24.    10/25 at 12:49 AM patient noted to have a number of bloody stools, alert and oriented.  She was placed on twice daily Protonix with serial H&H.  Noted that time NG tube was bilious nonbloody aspirate.    Labs this morning with a potassium of 3.2, creatinine 3.41, hemoglobin 10.3 (9.9 on 10/24 after 2 units of PRBCs on 10/24)    Patient seen today with nurse by bedside.  Apparently she had a small bowel movement with bright red blood on the evening of 08/01/2023 which was her first bowel movement for a while per her report.  Then yesterday had at least 3 bloody bowel movements of bright red blood with clots and then overnight just clots about 8-9 times.  This morning she has been cleaned twice with some darker looking stool.  Patient is unreliable with her history and denies any abdominal pain but is guarding on exam for the nursing staff.  She is also disoriented.  Currently with NG tube in place.  Nursing staff describes a total of 1850 cc out yesterday and 700 overnight.    Denies fever or chills.  Patient describes a colonoscopy 10 years ago, though her history is unreliable.  Past Medical History:  Diagnosis Date   Alcoholic (HCC)    Arthritis    Hypertension    VIN III (vulvar intraepithelial neoplasia III)  Vulvar lesion     Past Surgical History:  Procedure Laterality Date   ABDOMINAL HYSTERECTOMY  1989   CESAREAN SECTION     CHOLECYSTECTOMY     COLONOSCOPY N/A 12/10/2019   Procedure: COLONOSCOPY;  Surgeon: [name], MD;  Location: [facility];  Service: Endoscopy;  Laterality: N/A;  730   POLYPECTOMY  12/10/2019   Procedure: POLYPECTOMY;  Surgeon: [name], MD;  Location: [facility];  Service: Endoscopy;;   skin cancer removal      Family History  Problem Relation Age of Onset   Colon cancer Father    Heart disease Father    Breast cancer Mother     Heart disease Maternal Grandmother    Diabetes Paternal Grandmother     Social History   Tobacco Use   Smoking status: Some Days    Current packs/day: 0.50    Types: Cigarettes   Smokeless tobacco: Never  Vaping Use   Vaping status: Never Used  Substance Use Topics   Alcohol use: Yes    Comment: daily- hx of ETOH abuse - family reports to EMS   Drug use: No    Prior to Admission medications   Medication Sig Start Date End Date Taking? Authorizing Provider  acetaminophen (TYLENOL) 500 MG tablet Take 1,000 mg by mouth every 6 (six) hours as needed for moderate pain.    [provider]  albuterol (VENTOLIN HFA) 108 (90 Base) MCG/ACT inhaler Inhale 2 puffs into the lungs 2 (two) times daily as needed for shortness of breath. 11/11/19   [provider]  allopurinol (ZYLOPRIM) 100 MG tablet Take by mouth.    [provider]  ALPRAZolam Prudy Feeler) 0.5 MG tablet Take 0.5 mg by mouth 3 (three) times daily as needed for anxiety.     [provider]  ascorbic acid (VITAMIN C) 500 MG tablet Take 500 mg by mouth every other day.    [provider]  aspirin EC 325 MG tablet Take 325 mg by mouth daily as needed (headaches).    [provider]  B Complex Vitamins (B COMPLEX PO) Take 1 Dose by mouth every other day. Liquid b complex    [provider]  cetirizine (ZYRTEC) 10 MG tablet Take 10 mg by mouth daily as needed for allergies.    [provider]  Cholecalciferol (DIALYVITE VITAMIN D 5000) 125 MCG (5000 UT) capsule Take 5,000 Units by mouth every other day.    [provider]  estradiol (VIVELLE-DOT) 0.1 MG/24HR Place 1 patch onto the skin 2 (two) times a week.    [provider]  fluticasone (FLONASE) 50 MCG/ACT nasal spray Place 2 sprays into both nostrils daily.    [provider]  folic acid (FOLVITE) 1 MG tablet     [provider]  furosemide (LASIX) 40 MG tablet Take 1 tablet by  mouth daily. 06/18/23   [provider]  gabapentin (NEURONTIN) 300 MG capsule Take 300 mg by mouth at bedtime.    [provider]  Glycerin-Polysorbate 80 (REFRESH DRY EYE THERAPY OP) Place 1 drop into both eyes daily as needed (dry eyes).    [provider]  levocetirizine (XYZAL) 5 MG tablet Take 5 mg by mouth at bedtime.    [provider]  Magnesium 400 MG CAPS Take 400 mg by mouth every other day.    [provider]  Menthol-Methyl Salicylate (SALONPAS PAIN RELIEF PATCH EX) Apply 1-3 patches topically daily as  needed (pain).    [provider]  metoprolol succinate (TOPROL-XL) 25 MG 24 hr tablet Take 1 tablet by mouth daily. 06/18/23   [provider]  OVER THE COUNTER MEDICATION Take 6 tablets by mouth daily as needed (constipation). Tam herbal laxative    [provider]  pantoprazole (PROTONIX) 40 MG tablet Take 40 mg by mouth See admin instructions. Take 40 mg daily may take a second 40 mg dose as needed for heartburn    [provider]  potassium chloride (KLOR-CON) 10 MEQ tablet Take 10 mEq by mouth daily.    [provider]  Probiotic CAPS Take 1 capsule by mouth every other day.    [provider]  ramipril (ALTACE) 2.5 MG capsule Take 2.5 mg by mouth daily.    [provider]  rosuvastatin (CRESTOR) 5 MG tablet Take 5 mg by mouth daily.    [provider]  torsemide (DEMADEX) 20 MG tablet Take 20-40 mg by mouth See admin instructions. Take 20 or 40 mg once daily in the morning    [provider]  TRELEGY ELLIPTA 100-62.5-25 MCG/ACT AEPB Inhale 1 puff into the lungs daily. 07/11/23   [provider]  trolamine salicylate (ASPERCREME) 10 % cream Apply 1 application topically as needed for muscle pain.    [provider]  Turmeric Curcumin 500 MG CAPS Take 500 mg by mouth every other day.    [provider]  valACYclovir (VALTREX) 1000 MG  tablet Take 500-1,000 mg by mouth See admin instructions. Take 500 mg daily, may take 1000 mg instead if currently breaking out    [provider]  vitamin E 180 MG (400 UNITS) capsule Take 400 Units by mouth every other day.    [provider]  Wheat Dextrin (BENEFIBER DRINK MIX) PACK Take 4 g by mouth at bedtime. 12/10/19   [name], MD  Zinc 50 MG CAPS Take 50 mg by mouth every other day.    [provider]    Current Facility-Administered Medications  Medication Dose Route Frequency Provider Last Rate Last Admin    prismasol BGK 4/2.5 infusion   CRRT Continuous Annie Sable, MD 400 mL/hr at 07/30/23 2300 New Bag at 07/30/23 2300    prismasol BGK 4/2.5 infusion   CRRT Continuous Annie Sable, MD 400 mL/hr at 07/30/23 2300 New Bag at 07/30/23 2300   0.9 %  sodium chloride infusion (Manually program via Guardrails IV Fluids)   Intravenous Once Lanier Clam, NP       bisacodyl (DULCOLAX) suppository 10 mg  10 mg Rectal Daily Icard, Bradley L, DO   10 mg at 08/02/23 1050   calcium carbonate (TUMS - dosed in mg elemental calcium) chewable tablet 200 mg of elemental calcium  1 tablet Per Tube BID Karie Fetch P, DO   200 mg of elemental calcium at 08/02/23 2153   Chlorhexidine Gluconate Cloth 2 % PADS 6 each  6 each Topical Daily Assaker, [place]ali, MD   6 each at 08/02/23 1047   docusate sodium (COLACE) capsule 100 mg  100 mg Oral BID PRN Assaker, [place], MD       folic acid (FOLVITE) tablet 1 mg  1 mg Per Tube Daily Karie Fetch P, DO   1 mg at 08/02/23 1034   free water 100 mL  100 mL Per Tube Q6H Icard, Bradley L, DO   100 mL at 08/02/23 1857   insulin aspart (novoLOG) injection 0-15 Units  0-15 Units Subcutaneous Q4H Icard, Bradley L, DO   2 Units at 08/02/23 0841   lactulose (CHRONULAC) 10 GM/15ML solution 20 g  20 g Per Tube BID Icard, Bradley L, DO   20 g at 08/02/23 2153   lidocaine (XYLOCAINE) 1 % (with pres) injection 10 mL  10 mL  Intradermal Once Assaker, [place], MD       metoCLOPramide (REGLAN) injection 5 mg  5 mg Intravenous Q8H Icard, Bradley L, DO   5 mg at 08/03/23 2841   multivitamin (RENA-VIT) tablet 1 tablet  1 tablet Per Tube QHS Steffanie Dunn, DO   1 tablet at 08/02/23 2154   ondansetron (ZOFRAN) injection 4 mg  4 mg Intravenous Q6H PRN Icard, Bradley L, DO   4 mg at 07/31/23 1841   Oral care mouth rinse  15 mL Mouth Rinse PRN Assaker, [place], MD       pantoprazole (PROTONIX) injection 40 mg  40 mg Intravenous Q12H Migdalia Dk, MD   40 mg at 08/02/23 2238   polyethylene glycol (MIRALAX / GLYCOLAX) packet 17 g  17 g Per Tube Daily Karie Fetch P, DO   17 g at 08/02/23 1101   predniSONE (DELTASONE) tablet 30 mg  30 mg Per Tube Q breakfast Icard, Bradley L, DO       Followed by   Melene Muller ON 08/04/2023] predniSONE (DELTASONE) tablet 20 mg  20 mg Per Tube Q breakfast Icard, Bradley L, DO       Followed by   Melene Muller ON 08/05/2023] predniSONE (DELTASONE) tablet 10 mg  10 mg Per Tube Q breakfast Icard, Bradley L, DO       prismasol BGK 4/2.5 infusion   CRRT Continuous Annie Sable, MD 1,000 mL/hr at 07/31/23 0400 New Bag at 07/31/23 0400   senna (SENOKOT) tablet 8.6 mg  1 tablet Oral Daily PRN Wilmer Floor, RPH       sodium chloride 0.9 % primer fluid for CRRT   CRRT PRN Annie Sable, MD       sodium chloride flush (NS) 0.9 % injection 10-40 mL  10-40 mL Intracatheter Q12H Assaker, [place], MD   10 mL at 08/02/23 2313   sodium chloride flush (NS) 0.9 % injection 10-40 mL  10-40 mL Intracatheter PRN Assaker, [place], MD       spironolactone (ALDACTONE) tablet 12.5 mg  12.5 mg Oral Daily Icard, Bradley L, DO   12.5 mg at 08/02/23 1056   thiamine (VITAMIN B1) tablet 100 mg  100 mg Oral Daily Assaker, [place], MD   100 mg at 07/29/23 1028   Or   thiamine (VITAMIN B1) injection 100 mg  100 mg Intravenous Daily Assaker, [place] B[place], MD   100 mg at 08/02/23 1046   zinc sulfate  capsule 220 mg  220 mg Per Tube Daily Icard, Bradley L, DO   220 mg at 08/02/23 1053    Allergies as of 07/27/2023 - Review Complete 07/27/2023  Allergen Reaction Noted   Macrobid [nitrofurantoin macrocrystal] Hives 07/11/2012   Mirtazapine Swelling      Review of Systems:    Unable to complete due to encephalopathy   Physical Exam:  Vital signs in last 24 hours: Temp:  [97.5 F (36.4 C)-101.7 F (38.7 C)] 97.5 F (36.4 C) (10/25 0823) Pulse Rate:  [93-115] 110 (10/25 0700) Resp:  [12-19] 19 (10/25 0700) BP: (92-136)/(61-83) 112/69 (10/25 0700) SpO2:  [89 %-100 %] 95 % (10/25 0700) Arterial Line BP: (121-142)/(53-59) 126/58 (10/24 1700) Weight:  [  49.4 kg] 49.4 kg (10/25 0500) Last BM Date : 08/02/23 General: Critically ill-appearing Caucasian female appears to be in NAD, Well developed, Well nourished, alert  Head:  Normocephalic and atraumatic. Eyes:   PEERL, EOMI. No icterus. Conjunctiva pink. Ears:  Normal auditory acuity. Neck:  Supple Throat: Oral cavity and pharynx without inflammation, swelling or lesion. Teeth in good condition.+ NG tube in place Lungs: Respirations even and unlabored. Lungs clear to auscultation bilaterally.   No wheezes, crackles, or rhonchi.  Heart: Normal S1, S2. No MRG. Regular rate and rhythm. No peripheral edema, cyanosis or pallor.  Abdomen: Tense, mild to moderate distention and normal bowel sounds, no appreciable masses or hepatomegaly. Rectal:  Not performed.  Msk:  Symmetrical without gross deformities. Peripheral pulses intact.  Extremities:  Without edema, no deformity or joint abnormality. Normal ROM, normal sensation. Neurologic:  Alert and  oriented x2;  grossly normal neurologically.  Skin:   Dry and intact without significant lesions or rashes. Psychiatric: Disoriented, oriented to person and place only   LAB RESULTS: Recent Labs    08/02/23 0500 08/02/23 1513 08/02/23 2233 08/03/23 0041 08/03/23 0402  WBC 7.0 5.7  --   --   9.3  HGB 7.9* 7.4* 9.9* 10.1* 10.3*  HCT 24.9* 23.5* 29.5* 30.3* 30.9*  PLT 70* 84*  --   --  130*   BMET Recent Labs    08/02/23 0500 08/02/23 1512 08/03/23 0402  NA 139  140 141 144  K 3.7  3.8 3.6 3.2*  CL 98  98 97* 99  CO2 24  25 24 25   GLUCOSE 123*  120* 115* 100*  BUN 49*  50* 54* 58*  CREATININE 3.29*  3.22* 3.39* 3.41*  CALCIUM 8.9  8.9 8.8* 8.8*   LFT Recent Labs    08/02/23 0500 08/02/23 1512 08/03/23 0402  PROT 6.0*  --   --   ALBUMIN 2.1*  2.1*   < > 2.1*  AST 37  --   --   ALT 36  --   --   ALKPHOS 143*  --   --   BILITOT 1.7*  --   --   BILIDIR 0.7*  --   --   IBILI 1.0*  --   --    < > = values in this interval not displayed.   STUDIES: EEG adult  Result Date: August 02, 2023 Charlsie Quest, MD     Aug 02, 2023  5:36 PM Patient Name: Carol Rowe MRN: [id number] Epilepsy Attending: Charlsie Quest Referring Physician/Provider: Josephine Igo, DO Date: 07/30/2023 Duration: 22.45 mins Patient history: 67yo F with ams getting eeg to evaluate for seizure Level of alertness: Awake AEDs during EEG study: None Technical aspects: This EEG study was done with scalp electrodes positioned according to the 10-20 International system of electrode placement. Electrical activity was reviewed with band pass filter of 1-70Hz , sensitivity of 7 uV/mm, display speed of 46mm/sec with a 60Hz  notched filter applied as appropriate. EEG data were recorded continuously and digitally stored.  Video monitoring was available and reviewed as appropriate. Description: EEG showed continuous generalized 3 to 6 Hz theta-delta slowing, at times with triphasic morphology. Hyperventilation and photic stimulation were not performed.   ABNORMALITY - Continuous slow, generalized IMPRESSION: This study is suggestive of moderate diffuse encephalopathy. No seizures or epileptiform discharges were seen throughout the recording. Charlsie Quest   DG Abd 1 View  Result Date:  August 02, 2023 CLINICAL DATA:  Nasogastric tube placement. EXAM: ABDOMEN -  1 VIEW COMPARISON:  CT earlier today FINDINGS: Tip and side port of the enteric tube below the diaphragm in the stomach. Dilated loop of small bowel in the left abdomen as seen on CT. IMPRESSION: Tip and side port of the enteric tube below the diaphragm in the stomach. Electronically Signed   By: Narda Rutherford M.D.   On: 08/01/2023 14:48   CT ABDOMEN PELVIS WO CONTRAST  Result Date: 08/01/2023 CLINICAL DATA:  Bowel obstruction suspected EXAM: CT ABDOMEN AND PELVIS WITHOUT CONTRAST TECHNIQUE: Multidetector CT imaging of the abdomen and pelvis was performed following the standard protocol without IV contrast. RADIATION DOSE REDUCTION: This exam was performed according to the departmental dose-optimization program which includes automated exposure control, adjustment of the mA and/or kV according to patient size and/or use of iterative reconstruction technique. COMPARISON:  07/28/2023 FINDINGS: Lower chest: Small bilateral pleural effusions and associated atelectasis or consolidation, increased compared to prior examination. Three-vessel coronary artery calcifications. Hepatobiliary: No focal liver abnormality is seen. Hepatic steatosis. Status post cholecystectomy. No biliary dilatation. Pancreas: Unremarkable. No pancreatic ductal dilatation or surrounding inflammatory changes. Spleen: Normal in size without significant abnormality. Adrenals/Urinary Tract: Adrenal glands are unremarkable. Kidneys are normal, without renal calculi, solid lesion, or hydronephrosis. Foley catheter in the urinary bladder. Stomach/Bowel: Esophagogastric tube with tip and side port below the diaphragm, tip in the vicinity of the pylorus. Fluid-filled, mildly distended loops of small bowel throughout the central abdomen, measuring up to 4.2 cm in the central abdomen (series 3, image 61). There is some mildly tethered appearing and narrowed loops in the right  hemiabdomen (series 3, image 67), however there is no clear transition point, and there is a significant volume of gas and fluid present in the proximal colon, the distal colon remaining fairly decompressed. Stomach is otherwise within normal limits. Appendix appears normal. Sigmoid diverticulosis. Vascular/Lymphatic: Severe aortic atherosclerosis. No enlarged abdominal or pelvic lymph nodes. Reproductive: Status post hysterectomy. Other: No abdominal wall hernia or abnormality. No ascites. Musculoskeletal: No acute or significant osseous findings. IMPRESSION: 1. Fluid-filled, mildly distended loops of small bowel throughout the central abdomen, measuring up to 4.2 cm in the central abdomen. There are some mildly tethered appearing and narrowed loops in the right hemiabdomen, however there is no clear transition point, and there is a significant volume of gas and fluid present in the proximal colon. Findings are most consistent with ileus, partial or developing distal small bowel obstruction not excluded. 2. Small bilateral pleural effusions and associated atelectasis or consolidation, increased compared to prior examination. 3. Hepatic steatosis. 4. Coronary artery disease. Aortic Atherosclerosis (ICD10-I70.0). Electronically Signed   By: Jearld Lesch M.D.   On: 08/01/2023 13:18   CT HEAD WO CONTRAST ( )  Result Date: 08/01/2023 CLINICAL DATA:  Mental status change, persistent or worsening. EXAM: CT HEAD WITHOUT CONTRAST TECHNIQUE: Contiguous axial images were obtained from the base of the skull through the vertex without intravenous contrast. RADIATION DOSE REDUCTION: This exam was performed according to the departmental dose-optimization program which includes automated exposure control, adjustment of the mA and/or kV according to patient size and/or use of iterative reconstruction technique. COMPARISON:  None Available. FINDINGS: Brain: No evidence of acute infarction, hemorrhage, hydrocephalus,  extra-axial collection or mass lesion/mass effect. Remote right cerebellar infarct. Confluent hypodensity of the periventricular white matter, nonspecific, most likely related to chronic microangiopathy. Mild parenchymal volume loss. Vascular: Calcified plaques in the bilateral carotid siphons. Skull: Normal. Negative for fracture or focal lesion. Sinuses/Orbits: No acute finding. Other: None.  IMPRESSION: 1. No acute intracranial abnormality. 2. Remote right cerebellar infarct. 3. Chronic microangiopathy. Electronically Signed   By: Baldemar Lenis M.D.   On: 08/01/2023 12:11      Impression / Plan:   Impression: 1.  Hematochezia: Started with a small bright red blood bowel movement on the evening of 10/23, then increased over the past 24 hours with multiple blood clots overnight and now 2 darker looking stool, CT on 10/23 with question of partial small bowel obstruction, no prior colonoscopy to review, does remain tender on exam; consider ischemia most likely 2.  Septic shock likely from right lower lobe pneumonia 3.  Thrombocytopenia: On CT they called fatty liver, does have a history of alcohol abuse; liver disease could be contributing 4.  Anemia 5.  AKI/hyponatremia/hypokalemia 6.  Acute respiratory failure with hypoxia 7.  ?Cirrhosis: MELD-NA of 29, Some signs of possible encephalopathy which is thought from sedation +/- question of hepatic encephalopathy, started on lactulose and Spironolactone yesterday  Plan: 1.  Could consider nuclear med bleeding scan if has increase in bleeding (CTA unable to be completed due to kidney function).  Otherwise with acute illness she would be high risk for any procedures.  For now would recommend observation and transfusion as needed with a hemoglobin less than 7. 2.  Continue with NG tube decompression 3.  Agree with n.p.o. for now 4.  Agree with Pantoprazole 40 mg IV twice daily  Thank you for your kind consultation, we will continue to  follow.  Violet Baldy [facility]  08/03/2023, 8:56 AM

## 2023-08-03 NOTE — Progress Notes (Signed)
Assessment/Plan: 67 year old WF with cirrhosis, COPD found down.  Unknown renal function baseline but now oligo/anuric AKI  1.Renal- oligoanuric AKI -  likely due to profound hypotension on an ACE , mild rhabdo with ck of over 500.  100 of protein but mostly c/Rowe UTI -  imaging does not indicate hydro.  Due to acidosis needed to support with CRRT -  started on 10/19-10/22, rt fem temp  Will continue holding renal replacement but may  need to restart in the next 48-72 hrs. UOP starting to pick up and trending (no longer in the oliguric range). No absolute indications to restart for today and will try to hold as long as we can.  Large NGT output -> will support with D5 1/2NS as she's not getting any other fluids. May actually need more than that.   2. Hypertension/volume  - had septic shock although no source identified, possibly UTI -  no obvious abnormalities on bedside echo-  pressors/zosyn and vanc per CCM. Pressors off now.  3. Hypokalemia-  found down, poor nutrition  4. Metabolic acidosis-  corrected   4. Anemia  - could be due to GIB, CKD-  interestingly went from 8's to 10's without intervention -> 8.4.     Subjective:  CRRT off on 10/22.  off pressors and extubated. Pleasant and answering questions appropriately. Knew the year.  Objective Vital signs in last 24 hours: Vitals:   08/03/23 0800 08/03/23 0823 08/03/23 0900 08/03/23 1135  BP: 125/75  113/78   Pulse: (!) 116  (!) 110   Resp: 17  15   Temp:  (!) 97.5 F (36.4 C)  98.7 F (37.1 C)  TempSrc:  Axillary  Axillary  SpO2: 96%  97%   Weight:      Height:       Weight change: -3.9 kg  Intake/Output Summary (Last 24 hours) at 08/03/2023 1155 Last data filed at 08/03/2023 0612 Gross per 24 hour  Intake 335 ml  Output 1915 ml  Net -1580 ml     Carol Rowe    Labs: Basic Metabolic Panel: Recent Labs  Lab 08/02/23 0500 08/02/23 1512 08/03/23 0402  NA 139  140 141 144  K 3.7  3.8 3.6 3.2*  CL 98  98 97*  99  CO2 24  25 24 25   GLUCOSE 123*  120* 115* 100*  BUN 49*  50* 54* 58*  CREATININE 3.29*  3.22* 3.39* 3.41*  CALCIUM 8.9  8.9 8.8* 8.8*  PHOS 3.5 5.0* 5.1*   Liver Function Tests: Recent Labs  Lab 07/27/23 2201 07/28/23 1508 08/02/23 0500 08/02/23 1512 08/03/23 0402  AST 67*  --  37  --   --   ALT 32  --  36  --   --   ALKPHOS 208*  --  143*  --   --   BILITOT 1.0  --  1.7*  --   --   PROT 6.3*  --  6.0*  --   --   ALBUMIN 3.2*   < > 2.1*  2.1* 2.0* 2.1*   < > = values in this interval not displayed.   Recent Labs  Lab 08/02/23 0500  LIPASE 120*   Recent Labs  Lab 07/28/23 1725 07/31/23 0840  AMMONIA 50* 24   CBC: Recent Labs  Lab 07/27/23 2201 07/28/23 0234 07/31/23 0450 07/31/23 1203 08/01/23 0336 08/02/23 0500 08/02/23 1513 08/02/23 2233 08/03/23 0041 08/03/23 0402 08/03/23 1047  WBC 15.2*   < >  29.1*  --  15.2* 7.0 5.7  --   --  9.3  --   NEUTROABS 14.3*  --   --   --   --   --   --   --   --   --   --   HGB 8.8*   < > 9.1*   < > 8.4* 7.9* 7.4*   < > 10.1* 10.3* 9.8*  HCT 27.3*   < > 28.2*   < > 26.5* 24.9* 23.5*   < > 30.3* 30.9* 29.5*  MCV 103.0*   < > 101.1*  --  101.5* 101.2* 100.9*  --   --  97.2  --   PLT 227   < > 65*  --  52* 70* 84*  --   --  130*  --    < > = values in this interval not displayed.   Cardiac Enzymes: Recent Labs  Lab 07/27/23 2201  CKTOTAL 519*   CBG: Recent Labs  Lab 08/02/23 1545 08/02/23 2022 08/02/23 2319 08/03/23 0506 08/03/23 0820  GLUCAP 115* 85 117* 82 87    Iron Studies: No results for input(s): "IRON", "TIBC", "TRANSFERRIN", "FERRITIN" in the last 72 hours. Studies/Results: EEG adult  Result Date: 08/01/2023 Charlsie Quest, MD     08/01/2023  5:36 PM Patient Name: Carol Rowe MRN: [id number] Epilepsy Attending: Charlsie Quest Referring Physician/Provider: Josephine Igo, DO Date: 07/30/2023 Duration: 22.45 mins Patient history: 67yo F with ams getting eeg to evaluate for seizure  Level of alertness: Awake AEDs during EEG study: None Technical aspects: This EEG study was done with scalp electrodes positioned according to the 10-20 International system of electrode placement. Electrical activity was reviewed with band pass filter of 1-70Hz , sensitivity of 7 uV/mm, display speed of 30mm/sec with a 60Hz  notched filter applied as appropriate. EEG data were recorded continuously and digitally stored.  Video monitoring was available and reviewed as appropriate. Description: EEG showed continuous generalized 3 to 6 Hz theta-delta slowing, at times with triphasic morphology. Hyperventilation and photic stimulation were not performed.   ABNORMALITY - Continuous slow, generalized IMPRESSION: This study is suggestive of moderate diffuse encephalopathy. No seizures or epileptiform discharges were seen throughout the recording. Charlsie Quest   DG Abd 1 View  Result Date: 08/01/2023 CLINICAL DATA:  Nasogastric tube placement. EXAM: ABDOMEN - 1 VIEW COMPARISON:  CT earlier today FINDINGS: Tip and side port of the enteric tube below the diaphragm in the stomach. Dilated loop of small bowel in the left abdomen as seen on CT. IMPRESSION: Tip and side port of the enteric tube below the diaphragm in the stomach. Electronically Signed   By: Narda Rutherford M.D.   On: 08/01/2023 14:48   Medications: Infusions:   prismasol BGK 4/2.5 400 mL/hr at 07/30/23 2300    prismasol BGK 4/2.5 400 mL/hr at 07/30/23 2300   prismasol BGK 4/2.5 1,000 mL/hr at 07/31/23 0400    Scheduled Medications:  sodium chloride   Intravenous Once   bisacodyl  10 mg Rectal Daily   calcium carbonate  1 tablet Per Tube BID   Chlorhexidine Gluconate Cloth  6 each Topical Daily   folic acid  1 mg Per Tube Daily   free water  100 mL Per Tube Q6H   insulin aspart  0-15 Units Subcutaneous Q4H   lactulose  20 g Per Tube BID   lidocaine  10 mL Intradermal Once   metoCLOPramide (REGLAN) injection  5 mg Intravenous  Q8H    multivitamin  1 tablet Per Tube QHS   pantoprazole (PROTONIX) IV  40 mg Intravenous Q12H   polyethylene glycol  17 g Per Tube Daily   [START ON 08/04/2023] predniSONE  20 mg Per Tube Q breakfast   Followed by   Melene Muller ON 08/05/2023] predniSONE  10 mg Per Tube Q breakfast   sodium chloride flush  10-40 mL Intracatheter Q12H   spironolactone  12.5 mg Oral Daily   thiamine  100 mg Oral Daily   Or   thiamine  100 mg Intravenous Daily   zinc sulfate  220 mg Per Tube Daily    have reviewed scheduled and prn medications.  Physical Exam: General:extubated, pleasant Heart:RRR Lungs: CBS bilat Abdomen: slight distention Extremities: pitting edema  Dialysis Access: Rt fem HD cath placed 10/19    08/03/2023,11:55 AM  LOS: 7 days

## 2023-08-04 ENCOUNTER — Inpatient Hospital Stay (HOSPITAL_COMMUNITY): Payer: Medicare HMO

## 2023-08-04 DIAGNOSIS — R932 Abnormal findings on diagnostic imaging of liver and biliary tract: Secondary | ICD-10-CM | POA: Diagnosis not present

## 2023-08-04 DIAGNOSIS — K922 Gastrointestinal hemorrhage, unspecified: Secondary | ICD-10-CM | POA: Diagnosis not present

## 2023-08-04 DIAGNOSIS — D62 Acute posthemorrhagic anemia: Secondary | ICD-10-CM | POA: Diagnosis not present

## 2023-08-04 DIAGNOSIS — R6521 Severe sepsis with septic shock: Secondary | ICD-10-CM | POA: Diagnosis not present

## 2023-08-04 DIAGNOSIS — A419 Sepsis, unspecified organism: Secondary | ICD-10-CM | POA: Diagnosis not present

## 2023-08-04 DIAGNOSIS — E43 Unspecified severe protein-calorie malnutrition: Secondary | ICD-10-CM | POA: Diagnosis not present

## 2023-08-04 LAB — CBC
HCT: 27.8 % — ABNORMAL LOW (ref 36.0–46.0)
Hemoglobin: 9 g/dL — ABNORMAL LOW (ref 12.0–15.0)
MCH: 32.1 pg (ref 26.0–34.0)
MCHC: 32.4 g/dL (ref 30.0–36.0)
MCV: 99.3 fL (ref 80.0–100.0)
Platelets: 194 10*3/uL (ref 150–400)
RBC: 2.8 MIL/uL — ABNORMAL LOW (ref 3.87–5.11)
RDW: 20.3 % — ABNORMAL HIGH (ref 11.5–15.5)
WBC: 11.2 10*3/uL — ABNORMAL HIGH (ref 4.0–10.5)
nRBC: 0 % (ref 0.0–0.2)

## 2023-08-04 LAB — GASTROINTESTINAL PANEL BY PCR, STOOL (REPLACES STOOL CULTURE)

## 2023-08-04 LAB — RENAL FUNCTION PANEL
Albumin: 1.8 g/dL — ABNORMAL LOW (ref 3.5–5.0)
Albumin: 1.9 g/dL — ABNORMAL LOW (ref 3.5–5.0)
Anion gap: 15 (ref 5–15)
Anion gap: 14 (ref 5–15)
BUN: 55 mg/dL — ABNORMAL HIGH (ref 8–23)
BUN: 52 mg/dL — ABNORMAL HIGH (ref 8–23)
CO2: 24 mmol/L (ref 22–32)
CO2: 21 mmol/L — ABNORMAL LOW (ref 22–32)
Calcium: 8.4 mg/dL — ABNORMAL LOW (ref 8.9–10.3)
Calcium: 8.5 mg/dL — ABNORMAL LOW (ref 8.9–10.3)
Chloride: 105 mmol/L (ref 98–111)
Chloride: 109 mmol/L (ref 98–111)
Creatinine, Ser: 2.9 mg/dL — ABNORMAL HIGH (ref 0.44–1.00)
Creatinine, Ser: 2.53 mg/dL — ABNORMAL HIGH (ref 0.44–1.00)
GFR, Estimated: 17 mL/min — ABNORMAL LOW (ref >60)
GFR, Estimated: 21 mL/min — ABNORMAL LOW (ref >60)
Glucose, Bld: 135 mg/dL — ABNORMAL HIGH (ref 70–99)
Glucose, Bld: 99 mg/dL (ref 70–99)
Phosphorus: 4 mg/dL (ref 2.5–4.6)
Phosphorus: 3.4 mg/dL (ref 2.5–4.6)
Potassium: 3.3 mmol/L — ABNORMAL LOW (ref 3.5–5.1)
Potassium: 4.3 mmol/L (ref 3.5–5.1)
Sodium: 144 mmol/L (ref 135–145)
Sodium: 144 mmol/L (ref 135–145)

## 2023-08-04 LAB — PROTIME-INR
INR: 1.3 — ABNORMAL HIGH (ref 0.8–1.2)
Prothrombin Time: 16.4 s — ABNORMAL HIGH (ref 11.4–15.2)

## 2023-08-04 LAB — HEMOGLOBIN AND HEMATOCRIT, BLOOD
HCT: 27.2 % — ABNORMAL LOW (ref 36.0–46.0)
HCT: 27.5 % — ABNORMAL LOW (ref 36.0–46.0)
HCT: 28.7 % — ABNORMAL LOW (ref 36.0–46.0)
Hemoglobin: 9 g/dL — ABNORMAL LOW (ref 12.0–15.0)
Hemoglobin: 8.9 g/dL — ABNORMAL LOW (ref 12.0–15.0)
Hemoglobin: 9.4 g/dL — ABNORMAL LOW (ref 12.0–15.0)

## 2023-08-04 LAB — GLUCOSE, CAPILLARY
Glucose-Capillary: 111 mg/dL — ABNORMAL HIGH (ref 70–99)
Glucose-Capillary: 118 mg/dL — ABNORMAL HIGH (ref 70–99)
Glucose-Capillary: 139 mg/dL — ABNORMAL HIGH (ref 70–99)
Glucose-Capillary: 76 mg/dL (ref 70–99)
Glucose-Capillary: 88 mg/dL (ref 70–99)
Glucose-Capillary: 90 mg/dL (ref 70–99)

## 2023-08-04 LAB — PROCALCITONIN: Procalcitonin: 5.72 ng/mL

## 2023-08-04 LAB — MAGNESIUM: Magnesium: 2.2 mg/dL (ref 1.7–2.4)

## 2023-08-04 MED ORDER — PIPERACILLIN-TAZOBACTAM 3.375 G IVPB
3.3750 g | Freq: Three times a day (TID) | INTRAVENOUS | Status: DC
  Administered 2023-08-05 – 2023-08-06 (×5): 3.375 g via INTRAVENOUS
  Filled 2023-08-04 (×5): qty 50

## 2023-08-04 MED ORDER — DEXTROSE-SODIUM CHLORIDE 5-0.45 % IV SOLN
INTRAVENOUS | Status: AC
Start: 2023-08-04 — End: 2023-08-05

## 2023-08-04 MED ORDER — PIPERACILLIN-TAZOBACTAM 3.375 G IVPB: 3.3750 g | Freq: Three times a day (TID) | INTRAVENOUS | Status: DC

## 2023-08-04 MED ORDER — POTASSIUM CHLORIDE 10 MEQ/100ML IV SOLN
10.0000 meq | INTRAVENOUS | Status: AC
  Administered 2023-08-04 (×4): 10 meq via INTRAVENOUS
  Filled 2023-08-04 (×4): qty 100

## 2023-08-04 MED ORDER — PIPERACILLIN-TAZOBACTAM 3.375 G IVPB 30 MIN: 3.3750 g | Freq: Three times a day (TID) | INTRAVENOUS | Status: DC

## 2023-08-04 MED ORDER — PIPERACILLIN-TAZOBACTAM 3.375 G IVPB 30 MIN
3.3750 g | Freq: Three times a day (TID) | INTRAVENOUS | Status: DC
  Administered 2023-08-04 (×2): 3.375 g via INTRAVENOUS
  Filled 2023-08-04 (×5): qty 50

## 2023-08-04 NOTE — Plan of Care (Signed)

## 2023-08-04 NOTE — Progress Notes (Signed)
PROGRESS NOTE    Carol Rowe  VZD:[id number] DOB: May 13, 1956 DOA: 07/27/2023 PCP: Benita Stabile, MD   Brief Narrative: Carol Rowe is a 67 y.o. female with a history of primary hypertension, COPD, alcohol use disorder, hyperlipidemia.  Patient presented secondary to septic shock from [facility].  Patient admitted to ICU for vasopressor support in addition to need for intubation and mechanical ventilation.  Patient treated with antibiotics for pneumonia.  Hospitalization was complicated by development of AKI requiring initiation of CRRT while in ICU.  Hospitalization further complicated by encephalopathy and ileus.  Patient with NG tube.   Assessment and Plan:  Septic shock Present on admission. Presumed secondary to right lower lobe pneumonia. Patient admitted to the ICU for vasopressor support and stress dose steroids. Blood cultures obtained and empiric antibiotics started. Vasopressors weaned off on 10/22.  Right lower lobe pneumonia Patient completed treatment with Zosyn.  Acute respiratory failure with hypoxia Patient required intubation and mechanical ventilation from 10/19 until 10/22. Patient now on supplemental oxygen via nasal canula. -Wean to room air as able  Fever Tmax of 101.7 F. WBC is still. Patient has a recent pneumonia which was treated. Patient with bloody stools which could indicate GI infectious source. Procalcitonin obtained and is elevated at 6.41, trended down to 5.72.  -Follow-up GI panel, Blood cultures -Check chest x-ray -Start Zosyn IV empirically  Thrombocytopenia Likely reactive and secondary to sepsis. Platelets have recovered. Resolved.  GI bleeding New diagnosis. Patient has required blood transfusion. Red/maroon blood with clots noted. GI consulted with concern for possible infectious vs ischemic colitis. Low suspicion for upper GI bleeding. C. Difficile obtained and is negative. Bleeding appears to have significantly improved. GI  recommendation for no endoscopic evaluation while inpatient, but to consider upper and lower endoscopies as an outpatient.  Chronic anemia Acute blood loss anemia Likely secondary to GI bleeding. Patient has required a total of 1 unit of PRBC via transfusion. Hemoglobin is stable.  AKI Patient was oliguric this admission. Likely related to ATN from profound hypotension from septic shock while on an ACEi. Nephrology consulted and initiated CRRT while patient was in the ICU on 10/19, which was discontinued on 10/22 via right femoral temporary HD catheter. Renal function appears to continue improving. Nephrology signed off on 10/26.  Hyponatremia Severe. Resolved with IV fluids and management through CRRT.  Hypokalemia -Supplementation  High anion gap metabolic acidosis In relation to AKI. Improved with improvement of AKI.  Hypophosphatemia Resolved.  Hypocalcemia Resolved.  Possible cirrhosis Diagnosis does not appear to be accurate. No cirrhosis on CT imaging.  Coagulopathy In setting of acute illness. Question of underlying liver disease.  Hyperammonemia In setting of question of underlying liver disease. Ammonia level within normal limits.  Possible toxic encephalopathy Per chart review, concern encephalopathy was secondary to sedation. CT head negative for acute process. EEG obtained and was negative for evidence of seizure. Encephalopathy improving.  Ileus NG tube previously used for tube feeds with transition to intermittent suction. NG tube clamped on 10/25 with no development of nausea/vomiting. Patient has ongoing bowel function with flatus and bowel movements.  -SLP evaluation today -If patient passes SLP swallow testing, will remove NG tube and start oral diet.  Moderate protein calorie malnutrition NG tube placed in ICU and patient started on tube feeds which were discontinued on 10/21 secondary to development of ileus.   DVT prophylaxis: SCDs Code Status:    Code Status: Full Code Family Communication: None at bedside. Called  son and sister with no response Disposition Plan: Transfer to progressive floor   Consultants:  PCCM Nephrology [facility]  Procedures:  CRRT Intubation/ Extubation/ Mechanical ventilation  Antimicrobials: Vancomycin Zosyn    Subjective: No concerns from patient this morning. Feels well. No nausea or vomiting. Patient with no significant bloody stools noted with multiple bowel movements.  Objective: BP 99/61   Pulse (!) 102   Temp 98.2 F (36.8 C) (Oral)   Resp 14   Ht 5\' 3"  (1.6 m)   Wt 49.8 kg   SpO2 96%   BMI 19.45 kg/m   Examination:  General exam: Appears calm and comfortable Respiratory system: Clear to auscultation. Respiratory effort normal. Cardiovascular system: S1 & S2 heard, RRR.  Gastrointestinal system: Abdomen is nondistended, soft and nontender. Normal bowel sounds heard. Central nervous system: Alert and oriented. Psychiatry: Judgement and insight appear normal. Mood & affect appropriate.    Data Reviewed: I have personally reviewed following labs and imaging studies  CBC Lab Results  Component Value Date   WBC 11.2 (H) 08/04/2023   RBC 2.80 (L) 08/04/2023   HGB 9.0 (L) 08/04/2023   HCT 27.8 (L) 08/04/2023   MCV 99.3 08/04/2023   MCH 32.1 08/04/2023   PLT 194 08/04/2023   MCHC 32.4 08/04/2023   RDW 20.3 (H) 08/04/2023   LYMPHSABS 0.5 (L) 07/27/2023   MONOABS 0.3 07/27/2023   EOSABS 0.0 07/27/2023   BASOSABS 0.0 07/27/2023     Last metabolic panel Lab Results  Component Value Date   NA 144 08/04/2023   K 3.3 (L) 08/04/2023   CL 105 08/04/2023   CO2 24 08/04/2023   BUN 55 (H) 08/04/2023   CREATININE 2.90 (H) 08/04/2023   GLUCOSE 135 (H) 08/04/2023   GFRNONAA 17 (L) 08/04/2023   GFRAA >60 09/03/2017   CALCIUM 8.4 (L) 08/04/2023   PHOS 4.0 08/04/2023   PROT 6.0 (L) 08/02/2023   ALBUMIN 1.8 (L) 08/04/2023   BILITOT 1.7 (H) 08/02/2023    ALKPHOS 143 (H) 08/02/2023   AST 37 08/02/2023   ALT 36 08/02/2023   ANIONGAP 15 08/04/2023    GFR: Estimated Creatinine Clearance: 15.2 mL/min (A) (by C-G formula based on SCr of 2.9 mg/dL (H)).  Recent Results (from the past 240 hour(s))  Blood culture (routine x 2)     Status: None   Collection Time: 07/27/23 10:38 PM   Specimen: BLOOD RIGHT HAND  Result Value Ref Range Status   Specimen Description BLOOD RIGHT HAND BLOOD  Final   Special Requests   Final    BOTTLES DRAWN AEROBIC AND ANAEROBIC Blood Culture adequate volume   Culture   Final    NO GROWTH 5 DAYS Performed at [facility], [address], [place], [place] [address]    Report Status 08/01/2023 FINAL  Final  Blood culture (routine x 2)     Status: None   Collection Time: 07/27/23 10:49 PM   Specimen: Left Antecubital; Blood  Result Value Ref Range Status   Specimen Description LEFT ANTECUBITAL BLOOD  Final   Special Requests   Final    BOTTLES DRAWN AEROBIC AND ANAEROBIC Blood Culture results may not be optimal due to an excessive volume of blood received in culture bottles   Culture   Final    NO GROWTH 5 DAYS Performed at [facility], [address], [place], [place] [address]    Report Status 08/01/2023 FINAL  Final  MRSA Next Gen by PCR, Nasal  Status: None   Collection Time: 07/28/23  2:16 AM   Specimen: Nasal Mucosa; Nasal Swab  Result Value Ref Range Status   MRSA by PCR Next Gen NOT DETECTED NOT DETECTED Final    Comment: (NOTE) The GeneXpert MRSA Assay (FDA approved for NASAL specimens only), is one component of a comprehensive MRSA colonization surveillance program. It is not intended to diagnose MRSA infection nor to guide or monitor treatment for MRSA infections. Test performance is not FDA approved in patients less than 63 years old. Performed at [facility], [address], [place], [place] [address]   Culture, Respiratory w Gram Stain     Status: None   Collection Time: 07/29/23   4:13 PM   Specimen: Tracheal Aspirate; Respiratory  Result Value Ref Range Status   Specimen Description TRACHEAL ASPIRATE  Final   Special Requests NONE  Final   Gram Stain   Final    NO WBC SEEN RARE BUDDING YEAST SEEN Performed at [facility], [address], [place], [place] [address]    Culture FEW CANDIDA ALBICANS  Final   Report Status 07/31/2023 FINAL  Final  C Difficile Quick Screen (NO PCR Reflex)     Status: None   Collection Time: 08/03/23  3:36 PM   Specimen: STOOL  Result Value Ref Range Status   C Diff antigen NEGATIVE NEGATIVE Final   C Diff toxin NEGATIVE NEGATIVE Final   C Diff interpretation No C. difficile detected.  Final    Comment: Performed at [facility], [address], [place], [place] [address]  Culture, blood (Routine X 2) w Reflex to ID Panel     Status: None (Preliminary result)   Collection Time: 08/03/23  7:27 PM   Specimen: BLOOD  Result Value Ref Range Status   Specimen Description BLOOD SITE NOT SPECIFIED  Final   Special Requests   Final    BOTTLES DRAWN AEROBIC AND ANAEROBIC Blood Culture adequate volume   Culture   Final    NO GROWTH < 12 HOURS Performed at [facility], [address], [place], [place] [address]    Report Status PENDING  Incomplete  Culture, blood (Routine X 2) w Reflex to ID Panel     Status: None (Preliminary result)   Collection Time: 08/03/23  7:27 PM   Specimen: BLOOD  Result Value Ref Range Status   Specimen Description BLOOD SITE NOT SPECIFIED  Final   Special Requests   Final    BOTTLES DRAWN AEROBIC AND ANAEROBIC Blood Culture results may not be optimal due to an excessive volume of blood received in culture bottles   Culture   Final    NO GROWTH < 12 HOURS Performed at [facility], [address], [place], [place] [address]    Report Status PENDING  Incomplete      Radiology Studies: DG Abd Portable 1V  Result Date: 08/03/2023 CLINICAL DATA:  Ileus EXAM: PORTABLE ABDOMEN -  1 VIEW COMPARISON:  08/01/2023 CT and plain films. FINDINGS: NG tube is in the stomach with the tip in the distal stomach. Right groin dialysis catheter tip in the lower abdomen/upper pelvis. Continued prominent small bowel loops in the left abdomen, similar to prior study. No free air or organomegaly. Prior cholecystectomy. IMPRESSION: Continued mildly dilated left lower abdominal small bowel loops, not significantly changed since prior study. Electronically Signed   By: Charlett Nose M.D.   On: 08/03/2023 15:20  LOS: 8 days    Jacquelin Hawking, MD [facility] 08/04/2023, 9:42 AM   If 7PM-7AM, please contact night-coverage [email or web address]

## 2023-08-04 NOTE — Progress Notes (Addendum)
Pharmacy Antibiotic Note  Carol Rowe is a 67 y.o. female  with possible sepsis.  Pharmacy has been consulted for zosyn dosing.  -WBC= 11.2, tmax= 100  Plan: -Zosyn 3.375gm IV q8h (EI) -Will follow renal function, cultures and clinical progress  Height: 5\' 3"  (160 cm) Weight: 49.8 kg (109 lb 12.6 oz) IBW/kg (Calculated) : 52.4  Temp (24hrs), Avg:99.5 F (37.5 C), Min:98.2 F (36.8 C), Max:100 F (37.8 C)  Recent Labs  Lab 08/01/23 0336 08/01/23 1538 08/02/23 0500 08/02/23 1512 08/02/23 1513 08/03/23 0402 08/03/23 1536 08/04/23 0333  WBC 15.2*  --  7.0  --  5.7 9.3  --  11.2*  CREATININE 2.22*   < > 3.29*  3.22* 3.39*  --  3.41* 3.14* 2.90*   < > = values in this interval not displayed.    Estimated Creatinine Clearance: 15.2 mL/min (A) (by C-G formula based on SCr of 2.9 mg/dL (H)).    Allergies  Allergen Reactions   Diclofenac Other (See Comments)   Macrobid [Nitrofurantoin Macrocrystal] Hives   Mirtazapine Swelling    Antimicrobials this admission: Vanc 10/19 >> 10/21 Zosyn 10/19 >> 10/24; 10/26>>c  Dose adjustments this admission:   Microbiology results: 10/18 blood x2- ngtd 10/20 resp - few candida (likely colonization) 10/19 MRSA PCR- neg 10/25 blood x2 - ngtd  Thank you for allowing pharmacy to be a part of this patient's care.  Trixie Rude, PharmD Clinical Pharmacist 08/04/2023  6:22 PM

## 2023-08-04 NOTE — Progress Notes (Signed)
Assessment/Plan: 67 year old WF with cirrhosis, COPD found down.  Unknown renal function baseline but now oligo/anuric AKI  1.Renal- oligoanuric AKI -  likely due to profound hypotension on an ACE , mild rhabdo with ck of over 500.  100 of protein but mostly c/w UTI -  imaging does not indicate hydro.  Due to acidosis needed to support with CRRT -  started on 10/19-10/22, rt fem temp  Will continue holding renal replacement as UOP started to pick up. Finally creatinine is decreasing w/ fluids given yesterday. Will need to continue to give fluid as needed if she's not consuming much.  Signing off at this time; please reconsult as needed.    2. Hypertension/volume  - had septic shock although no source identified, possibly UTI -  no obvious abnormalities on bedside echo-  pressors/zosyn and vanc per CCM. Pressors off now.  3. Hypokalemia-  found down, poor nutrition  4. Metabolic acidosis-  corrected   4. Anemia  - could be due to GIB, CKD-  interestingly went from 8's to 10's without intervention -> 8.4.     Subjective:  CRRT off on 10/22.  off pressors and extubated. Pleasant and answering questions appropriately. Knew the year.  Objective Vital signs in last 24 hours: Vitals:   08/04/23 0400 08/04/23 0500 08/04/23 0600 08/04/23 0800  BP: 102/73 117/62 99/61   Pulse: 93 (!) 102    Resp: 16 18 14    Temp: 99.3 F (37.4 C) 99.5 F (37.5 C)  98.2 F (36.8 C)  TempSrc:    Oral  SpO2: 96% 96% 96%   Weight:  49.8 kg    Height:       Weight change: 0.4 kg  Intake/Output Summary (Last 24 hours) at 08/04/2023 1136 Last data filed at 08/04/2023 0600 Gross per 24 hour  Intake 1619.78 ml  Output 590 ml  Net 1029.78 ml     Kip Kautzman W    Labs: Basic Metabolic Panel: Recent Labs  Lab 08/03/23 0402 08/03/23 1536 08/04/23 0333  NA 144 145 144  K 3.2* 3.3* 3.3*  CL 99 105 105  CO2 25 23 24   GLUCOSE 100* 151* 135*  BUN 58* 59* 55*  CREATININE 3.41* 3.14* 2.90*  CALCIUM  8.8* 8.4* 8.4*  PHOS 5.1* 5.8* 4.0   Liver Function Tests: Recent Labs  Lab 08/02/23 0500 08/02/23 1512 08/03/23 0402 08/03/23 1536 08/04/23 0333  AST 37  --   --   --   --   ALT 36  --   --   --   --   ALKPHOS 143*  --   --   --   --   BILITOT 1.7*  --   --   --   --   PROT 6.0*  --   --   --   --   ALBUMIN 2.1*  2.1*   < > 2.1* 1.9* 1.8*   < > = values in this interval not displayed.   Recent Labs  Lab 08/02/23 0500  LIPASE 120*   Recent Labs  Lab 07/28/23 1725 07/31/23 0840  AMMONIA 50* 24   CBC: Recent Labs  Lab 08/01/23 0336 08/02/23 0500 08/02/23 1513 08/02/23 2233 08/03/23 0402 08/03/23 1047 08/03/23 2231 08/04/23 0333 08/04/23 0946  WBC 15.2* 7.0 5.7  --  9.3  --   --  11.2*  --   HGB 8.4* 7.9* 7.4*   < > 10.3*   < > 9.0* 9.0* 8.9*  HCT  26.5* 24.9* 23.5*   < > 30.9*   < > 27.2* 27.8* 27.5*  MCV 101.5* 101.2* 100.9*  --  97.2  --   --  99.3  --   PLT 52* 70* 84*  --  130*  --   --  194  --    < > = values in this interval not displayed.   Cardiac Enzymes: No results for input(s): "CKTOTAL", "CKMB", "CKMBINDEX", "TROPONINI" in the last 168 hours.  CBG: Recent Labs  Lab 08/03/23 1553 08/03/23 2009 08/04/23 0000 08/04/23 0408 08/04/23 0809  GLUCAP 141* 137* 90 111* 118*    Iron Studies: No results for input(s): "IRON", "TIBC", "TRANSFERRIN", "FERRITIN" in the last 72 hours. Studies/Results: DG Abd Portable 1V  Result Date: 08/03/2023 CLINICAL DATA:  Ileus EXAM: PORTABLE ABDOMEN - 1 VIEW COMPARISON:  08/01/2023 CT and plain films. FINDINGS: NG tube is in the stomach with the tip in the distal stomach. Right groin dialysis catheter tip in the lower abdomen/upper pelvis. Continued prominent small bowel loops in the left abdomen, similar to prior study. No free air or organomegaly. Prior cholecystectomy. IMPRESSION: Continued mildly dilated left lower abdominal small bowel loops, not significantly changed since prior study. Electronically Signed    By: Charlett Nose M.D.   On: 08/03/2023 15:20   Medications: Infusions:   prismasol BGK 4/2.5 400 mL/hr at 07/30/23 2300    prismasol BGK 4/2.5 400 mL/hr at 07/30/23 2300   piperacillin-tazobactam 3.375 g (08/04/23 0748)   potassium chloride 10 mEq (08/04/23 1044)   prismasol BGK 4/2.5 1,000 mL/hr at 07/31/23 0400    Scheduled Medications:  sodium chloride   Intravenous Once   bisacodyl  10 mg Rectal Daily   calcium carbonate  1 tablet Per Tube BID   Chlorhexidine Gluconate Cloth  6 each Topical Daily   folic acid  1 mg Per Tube Daily   free water  100 mL Per Tube Q6H   insulin aspart  0-15 Units Subcutaneous Q4H   lactulose  20 g Per Tube BID   lidocaine  10 mL Intradermal Once   metoCLOPramide (REGLAN) injection  5 mg Intravenous Q8H   multivitamin  1 tablet Per Tube QHS   pantoprazole (PROTONIX) IV  40 mg Intravenous Q12H   polyethylene glycol  17 g Per Tube Daily   [START ON 08/05/2023] predniSONE  10 mg Per Tube Q breakfast   sodium chloride flush  10-40 mL Intracatheter Q12H   spironolactone  12.5 mg Oral Daily   thiamine  100 mg Oral Daily   Or   thiamine  100 mg Intravenous Daily   zinc sulfate  220 mg Per Tube Daily    have reviewed scheduled and prn medications.  Physical Exam: General: pleasant, appropriate Heart:RRR Lungs: CBS bilat Abdomen: slight distention Extremities: tr edema  Dialysis Access: Rt fem HD cath placed 10/19    08/04/2023,11:36 AM  LOS: 8 days

## 2023-08-04 NOTE — Progress Notes (Signed)
Progress Note   Subjective  No bleeding per nursing. Green output from OG, not much of any significant rectal output. A few small BMs with trace blood.    Objective   Vital signs in last 24 hours: Temp:  [97.5 F (36.4 C)-100 F (37.8 C)] 99.5 F (37.5 C) (10/26 0500) Pulse Rate:  [93-117] 102 (10/26 0500) Resp:  [14-27] 14 (10/26 0600) BP: (99-125)/(59-78) 99/61 (10/26 0600) SpO2:  [91 %-99 %] 96 % (10/26 0600) Weight:  [49.8 kg] 49.8 kg (10/26 0500) Last BM Date : 08/03/23 General:    white female in bed, NG in place Abdomen:  Soft, nontender, some distension.  Psych:  Cooperative. Normal mood and affect.  Intake/Output from previous day: 10/25 0701 - 10/26 0700 In: 1619.8 [I.V.:1219.3; NG/GT:200; IV Piggyback:200.5] Out: 710 [Urine:560; Emesis/NG output:150] Intake/Output this shift: No intake/output data recorded.  Lab Results: Recent Labs    08/02/23 1513 08/02/23 2233 08/03/23 0402 08/03/23 1047 08/03/23 1536 08/03/23 2231 08/04/23 0333  WBC 5.7  --  9.3  --   --   --  11.2*  HGB 7.4*   < > 10.3*   < > 9.5* 9.0* 9.0*  HCT 23.5*   < > 30.9*   < > 28.6* 27.2* 27.8*  PLT 84*  --  130*  --   --   --  194   < > = values in this interval not displayed.   BMET Recent Labs    08/03/23 0402 08/03/23 1536 08/04/23 0333  NA 144 145 144  K 3.2* 3.3* 3.3*  CL 99 105 105  CO2 25 23 24   GLUCOSE 100* 151* 135*  BUN 58* 59* 55*  CREATININE 3.41* 3.14* 2.90*  CALCIUM 8.8* 8.4* 8.4*   LFT Recent Labs    08/02/23 0500 08/02/23 1512 08/04/23 0333  PROT 6.0*  --   --   ALBUMIN 2.1*  2.1*   < > 1.8*  AST 37  --   --   ALT 36  --   --   ALKPHOS 143*  --   --   BILITOT 1.7*  --   --   BILIDIR 0.7*  --   --   IBILI 1.0*  --   --    < > = values in this interval not displayed.   PT/INR No results for input(s): "LABPROT", "INR" in the last 72 hours.  Studies/Results: DG Abd Portable 1V  Result Date: 08/03/2023 CLINICAL DATA:  Ileus EXAM: PORTABLE  ABDOMEN - 1 VIEW COMPARISON:  08/01/2023 CT and plain films. FINDINGS: NG tube is in the stomach with the tip in the distal stomach. Right groin dialysis catheter tip in the lower abdomen/upper pelvis. Continued prominent small bowel loops in the left abdomen, similar to prior study. No free air or organomegaly. Prior cholecystectomy. IMPRESSION: Continued mildly dilated left lower abdominal small bowel loops, not significantly changed since prior study. Electronically Signed   By: Charlett Nose M.D.   On: 08/03/2023 15:20       Assessment / Plan:    67 y/o female here with the following:  Lower GI bleed - suspect ischemic colitis vs. possibly infectious Anemia Possible cirrhosis Ileus Sepsis - pneumonia  She really has not had much of any significant bleeding since we have seen her yesterday.  Her hemoglobin is stable.  Her bleeding was mostly about 48 hours ago and has slowly improved.  Again I suspect this is likely a lower GI bleed,  perhaps from ischemic colitis in the setting of critical illness or possibly infectious, C. difficile negative, GI pathogen panel pending.  She does have an ileus, NG putting out green bilious fluid, no evidence of blood there, upper GI bleed seems less likely.  She is not a good candidate for colonoscopy given her active infection and her ileus, could not tolerate prep.  Fortunately she has seemed to resolve this on her own.  Hopefully with more time with treatment of her infection she can resolve this ileus.  Continue NG tube for now, unfortunately we cannot give her enteral nutrition.  She is quite ill, would consider starting TPN soon.  She does warrant a EGD and colonoscopy at some point, evaluate for varices given possible underlying cirrhosis, and to assess her colon in light of the symptoms, however would do as outpatient when she has recovered from her acute issues if no further bleeding.  She needs to completely abstain from alcohol moving forward.  She  follows with Dr. Karilyn Cota and can see him as outpatient following her discharge here.  PLAN: - no plans for EGD / colonoscopy at this time as above given she appears to have resolved at this bleeding - await results of GI pathogen panel - continue supportive measures, treating infection, hopefully with more time her ileus resolves  - consideration for starting TPN for nutrition - I think a good idea to do an EGD and colonoscopy when she has recovered from these acute issues, as outpatient.  She is currently not a good candidate for procedures right now unless she has persistent or severe bleeding  We will sign off for now, she can follow up with Dr. Karilyn Cota her primary GI after this hospitalization. Call with questions in the interim.  Harlin Rain, MD [facility]

## 2023-08-04 NOTE — Plan of Care (Signed)

## 2023-08-05 DIAGNOSIS — R932 Abnormal findings on diagnostic imaging of liver and biliary tract: Secondary | ICD-10-CM

## 2023-08-05 DIAGNOSIS — D62 Acute posthemorrhagic anemia: Secondary | ICD-10-CM | POA: Diagnosis not present

## 2023-08-05 DIAGNOSIS — K922 Gastrointestinal hemorrhage, unspecified: Secondary | ICD-10-CM | POA: Diagnosis not present

## 2023-08-05 DIAGNOSIS — A419 Sepsis, unspecified organism: Secondary | ICD-10-CM | POA: Diagnosis not present

## 2023-08-05 LAB — GLUCOSE, CAPILLARY
Glucose-Capillary: 110 mg/dL — ABNORMAL HIGH (ref 70–99)
Glucose-Capillary: 81 mg/dL (ref 70–99)
Glucose-Capillary: 86 mg/dL (ref 70–99)
Glucose-Capillary: 87 mg/dL (ref 70–99)
Glucose-Capillary: 88 mg/dL (ref 70–99)
Glucose-Capillary: 97 mg/dL (ref 70–99)

## 2023-08-05 LAB — CBC
HCT: 30.2 % — ABNORMAL LOW (ref 36.0–46.0)
Hemoglobin: 9.8 g/dL — ABNORMAL LOW (ref 12.0–15.0)
MCH: 32.7 pg (ref 26.0–34.0)
MCHC: 32.5 g/dL (ref 30.0–36.0)
MCV: 100.7 fL — ABNORMAL HIGH (ref 80.0–100.0)
Platelets: 231 10*3/uL (ref 150–400)
RBC: 3 MIL/uL — ABNORMAL LOW (ref 3.87–5.11)
RDW: 19.8 % — ABNORMAL HIGH (ref 11.5–15.5)
WBC: 9.9 10*3/uL (ref 4.0–10.5)
nRBC: 0 % (ref 0.0–0.2)

## 2023-08-05 LAB — RENAL FUNCTION PANEL
Albumin: 1.8 g/dL — ABNORMAL LOW (ref 3.5–5.0)
Anion gap: 15 (ref 5–15)
BUN: 47 mg/dL — ABNORMAL HIGH (ref 8–23)
CO2: 17 mmol/L — ABNORMAL LOW (ref 22–32)
Calcium: 8 mg/dL — ABNORMAL LOW (ref 8.9–10.3)
Chloride: 110 mmol/L (ref 98–111)
Creatinine, Ser: 2.46 mg/dL — ABNORMAL HIGH (ref 0.44–1.00)
GFR, Estimated: 21 mL/min — ABNORMAL LOW (ref >60)
Glucose, Bld: 90 mg/dL (ref 70–99)
Phosphorus: 3.8 mg/dL (ref 2.5–4.6)
Potassium: 4.1 mmol/L (ref 3.5–5.1)
Sodium: 142 mmol/L (ref 135–145)

## 2023-08-05 LAB — MAGNESIUM: Magnesium: 1.9 mg/dL (ref 1.7–2.4)

## 2023-08-05 MED ORDER — DEXTROSE-SODIUM CHLORIDE 5-0.45 % IV SOLN: INTRAVENOUS | Status: AC

## 2023-08-05 NOTE — Evaluation (Signed)
Clinical/Bedside Swallow Evaluation Patient Details  Name: Carol Rowe MRN: [id number] Date of Birth: January 13, 1956  Today's Date: 08/05/2023 Time: SLP Start Time (ACUTE ONLY): 0740 SLP Stop Time (ACUTE ONLY): 0753 SLP Time Calculation (min) (ACUTE ONLY): 13 min  Past Medical History:  Past Medical History:  Diagnosis Date   Alcoholic (HCC)    Arthritis    Hypertension    VIN III (vulvar intraepithelial neoplasia III)    Vulvar lesion    Past Surgical History:  Past Surgical History:  Procedure Laterality Date   ABDOMINAL HYSTERECTOMY  1989   CESAREAN SECTION     CHOLECYSTECTOMY     COLONOSCOPY N/A 12/10/2019   Procedure: COLONOSCOPY;  Surgeon: [name], MD;  Location: [facility];  Service: Endoscopy;  Laterality: N/A;  730   POLYPECTOMY  12/10/2019   Procedure: POLYPECTOMY;  Surgeon: [name], MD;  Location: [facility];  Service: Endoscopy;;   skin cancer removal     HPI:  This is a case of a 67 year old female patient with a past medical history of alcohol use disorder, alcoholic liver cirrhosis, essential hypertension and presumed COPD presenting to [facility] as a transfer from [facility] for shock.  She initially presented to [facility] on 10/18 after a family member went over to her house and found her down.  She apparently was walking to the bathroom and fell down and struck her head.  EMS were called by family members and on arrival she was found down hypoglycemic at the 30s.  She was given IV dextrose and transferred to [facility].  Last known normal 6 days ago.  In the ED she was found to be hypotensive, altered with left-sided focal deficits.  Therefore a code stroke was called and a stat CT head was obtained which was nonrevealing.  Neurology was consulted and felt less likely to be stroke as no focal weakness was present on their exam.  Felt this was secondary to metabolic encephalopathy.   She was diagnosed with shock secondary to  sepsisof unknown etiology, metabolic encephalopathy, severe oliguric AKI adn hypokalemia.  Chest xray was showing patchy bibasilar consolidation consistent with atelectasis and/or scarring with no acute airspace disease.  CT of the head was showing no acute intracranial process.  She was intubated for about 4 days and was extubated on 08/01/2023.    Assessment / Plan / Recommendation  Clinical Impression  Clinical swallowing evaluation was completed in setting of prolonged intubation.  RN approved patient for PO intake. Patient did not endorse any swallowing issues prior to admission.  Cranial nerve exam was completed and unremarkable. Lingual, labial, facial and jaw range of motion and strength appeared to be adequate.  She was presented with ice chips, thin liquids via spoon, cup and straw, pureed material and dry solids.  She required assistance for self feeding.  There was concern for oral and pharyngeal dysphagia.   Mastication of dry solids was slow with mild oral residue noted post swallow.  A/P transport appeared to be slow for pureed material.   Swallow trigger was appreciated to palpation.  Patient with a cough response given large bolus of thin liquids given via assisted cup sips.  She was also noted to clear her throat on small bolus of thins via straw following dry solids.  Given clinical presentation and prolonged intubation recommend that she remain NPO except ice chips with staff pending MBS tomorrow. SLP Visit Diagnosis: Dysphagia, unspecified (R13.10)  Aspiration Risk  Moderate aspiration risk    Diet Recommendation   NPO  Medication Administration: Via alternative means    Other  Recommendations Oral Care Recommendations: Oral care QID;Oral care prior to ice chip/H20;Staff/trained caregiver to provide oral care    Recommendations for follow up therapy are one component of a multi-disciplinary discharge planning process, led by the attending physician.  Recommendations may be  updated based on patient status, additional functional criteria and insurance authorization.      Prognosis Prognosis for improved oropharyngeal function: Good      Swallow Study   General Date of Onset: 07/27/23 HPI: This is a case of a 67 year old female patient with a past medical history of alcohol use disorder, alcoholic liver cirrhosis, essential hypertension and presumed COPD presenting to [facility] as a transfer from [facility] for shock.  She initially presented to [facility] on 10/18 after a family member went over to her house and found her down.  She apparently was walking to the bathroom and fell down and struck her head.  EMS were called by family members and on arrival she was found down hypoglycemic at the 30s.  She was given IV dextrose and transferred to [facility].  Last known normal 6 days ago.  In the ED she was found to be hypotensive, altered with left-sided focal deficits.  Therefore a code stroke was called and a stat CT head was obtained which was nonrevealing.  Neurology was consulted and felt less likely to be stroke as no focal weakness was present on their exam.  Felt this was secondary to metabolic encephalopathy.   She was diagnosed with shock secondary to sepsisof unknown etiology, metabolic encephalopathy, severe oliguric AKI adn hypokalemia.  Chest xray was showing patchy bibasilar consolidation consistent with atelectasis and/or scarring with no acute airspace disease.  CT of the head was showing no acute intracranial process.  She was intubated for about 4 days and was extubated on 08/01/2023. Type of Study: Bedside Swallow Evaluation Previous Swallow Assessment: None noted at [facility]. Diet Prior to this Study: Large bore NG tube Temperature Spikes Noted: No Respiratory Status: Room air History of Recent Intubation: Yes Total duration of intubation (days): 4 days Date extubated: 08/01/23 Behavior/Cognition: Alert;Cooperative;Pleasant  mood;Requires cueing Oral Cavity Assessment: Dry Oral Care Completed by SLP: No Oral Cavity - Dentition: Adequate natural dentition Vision: Functional for self-feeding Self-Feeding Abilities: Needs assist Patient Positioning: Upright in bed Baseline Vocal Quality: Low vocal intensity Volitional Swallow: Unable to elicit    Oral/Motor/Sensory Function Overall Oral Motor/Sensory Function: Within functional limits   Ice Chips Ice chips: Within functional limits Presentation: Spoon   Thin Liquid Thin Liquid: Impaired Presentation: Straw;Spoon;Cup Pharyngeal  Phase Impairments: Cough - Immediate;Throat Clearing - Delayed    Nectar Thick Nectar Thick Liquid: Not tested   Honey Thick Honey Thick Liquid: Not tested   Puree Puree: Impaired Presentation: Spoon Oral Phase Impairments: Impaired mastication Oral Phase Functional Implications: Prolonged oral transit   Solid     Solid: Impaired Presentation: Spoon Oral Phase Impairments: Impaired mastication Oral Phase Functional Implications: Prolonged oral transit;Oral residue     Dimas Aguas, MA, CCC-SLP Acute Rehab SLP [phone]  Fleet Contras 08/05/2023,8:03 AM

## 2023-08-05 NOTE — Plan of Care (Signed)

## 2023-08-05 NOTE — Progress Notes (Signed)
PROGRESS NOTE    Carol Rowe  ION:[id number] DOB: 10-31-1955 DOA: 07/27/2023 PCP: Benita Stabile, MD   Brief Narrative: Carol Rowe is a 67 y.o. female with a history of primary hypertension, COPD, alcohol use disorder, hyperlipidemia.  Patient presented secondary to septic shock from [facility]  Patient admitted to ICU for vasopressor support in addition to need for intubation and mechanical ventilation.  Patient treated with antibiotics for pneumonia.  Hospitalization was complicated by development of AKI requiring initiation of CRRT while in ICU.  Hospitalization further complicated by encephalopathy and ileus.  Patient with NG tube.   Assessment and Plan:  Septic shock Present on admission. Presumed secondary to right lower lobe pneumonia. Patient admitted to the ICU for vasopressor support and stress dose steroids. Blood cultures obtained and empiric antibiotics started. Vasopressors weaned off on 10/22.  Right lower lobe pneumonia Patient completed treatment with Zosyn.  Acute respiratory failure with hypoxia Patient required intubation and mechanical ventilation from 10/19 until 10/22. Patient now on supplemental oxygen via nasal canula. Weaned to room air.  Fever Tmax of 101.7 F. WBC is still. Patient has a recent pneumonia which was treated but has possible re-aspiration events. Patient with bloody stools which could indicate GI infectious source. Procalcitonin obtained and is elevated at 6.41, trended down to 5.72. Chest x-ray without airspace disease. Afebrile overnight. GI pathogen panel negative. -Follow-up nblood cultures -Continue Zosyn IV empirically and will switch to Augmentin once able to take PO  Thrombocytopenia Likely reactive and secondary to sepsis. Platelets have recovered. Resolved.  GI bleeding New diagnosis. Patient has required blood transfusion. Red/maroon blood with clots noted. GI consulted with concern for possible infectious vs ischemic  colitis. Low suspicion for upper GI bleeding. C. Difficile and GI pathogen panel obtained and are negative. Bleeding appears to have significantly improved. GI recommendation for no endoscopic evaluation while inpatient, but to consider upper and lower endoscopies as an outpatient.  Chronic anemia Acute blood loss anemia Likely secondary to GI bleeding. Patient has required a total of 1 unit of PRBC via transfusion. Hemoglobin is stable.  AKI Patient was oliguric this admission. Likely related to ATN from profound hypotension from septic shock while on an ACEi. Nephrology consulted and initiated CRRT while patient was in the ICU on 10/19, which was discontinued on 10/22 via right femoral temporary HD catheter. Renal function appears to continue improving. Nephrology signed off on 10/26.  Hyponatremia Severe. Resolved with IV fluids and management through CRRT.  Hypokalemia -Supplementation  High anion gap metabolic acidosis In relation to AKI. Improved with improvement of AKI.  Hypophosphatemia Resolved.  Hypocalcemia Resolved.  Possible cirrhosis Diagnosis does not appear to be accurate. No cirrhosis on CT imaging.  Coagulopathy In setting of acute illness. Question of underlying liver disease.  Hyperammonemia In setting of question of underlying liver disease. Ammonia level within normal limits.  Possible toxic encephalopathy Per chart review, concern encephalopathy was secondary to sedation. CT head negative for acute process. EEG obtained and was negative for evidence of seizure. Encephalopathy improving.  Ileus NG tube previously used for tube feeds with transition to intermittent suction. NG tube clamped on 10/25 with no development of nausea/vomiting. Patient has ongoing bowel function with flatus and bowel movements. She has tolerated a clamping trial. Plan was to attempt PO intake vs tube feed initiation, however MBS scheduled for 10/28 and patient accidentally pulled  out NG tube. -Follow-up SLP recommendations -If fails MBS, will need to re-insert NG tube versus  consider PEG tube placement  Moderate protein calorie malnutrition NG tube placed in ICU and patient started on tube feeds which were discontinued on 10/21 secondary to development of ileus.   DVT prophylaxis: SCDs Code Status:   Code Status: Full Code Family Communication: None at bedside. Called son and sister with no response Disposition Plan: Transfer to progressive floor   Consultants:  [facility] Nephrology [facility]  Procedures:  CRRT Intubation/ Extubation/ Mechanical ventilation  Antimicrobials: Vancomycin Zosyn    Subjective: No concerns from patient this morning. Feels well. No nausea or vomiting. Patient with no significant bloody stools noted with multiple bowel movements.  Objective: BP 111/67   Pulse (!) 102   Temp 98 F (36.7 C) (Oral)   Resp (!) 21   Ht 5\' 3"  (1.6 m)   Wt 49.7 kg   SpO2 95%   BMI 19.41 kg/m   Examination:  General exam: Appears calm and comfortable Respiratory system: Clear to auscultation. Respiratory effort normal. Cardiovascular system: S1 & S2 heard, RRR.  Gastrointestinal system: Abdomen is nondistended, soft and nontender. Normal bowel sounds heard. Central nervous system: Alert and oriented. Psychiatry: Judgement and insight appear normal. Mood & affect appropriate.    Data Reviewed: I have personally reviewed following labs and imaging studies  CBC Lab Results  Component Value Date   WBC 9.9 08/05/2023   RBC 3.00 (L) 08/05/2023   HGB 9.8 (L) 08/05/2023   HCT 30.2 (L) 08/05/2023   MCV 100.7 (H) 08/05/2023   MCH 32.7 08/05/2023   PLT 231 08/05/2023   MCHC 32.5 08/05/2023   RDW 19.8 (H) 08/05/2023   LYMPHSABS 0.5 (L) 07/27/2023   MONOABS 0.3 07/27/2023   EOSABS 0.0 07/27/2023   BASOSABS 0.0 07/27/2023     Last metabolic panel Lab Results  Component Value Date   NA 142 08/05/2023   K 4.1 08/05/2023    CL 110 08/05/2023   CO2 17 (L) 08/05/2023   BUN 47 (H) 08/05/2023   CREATININE 2.46 (H) 08/05/2023   GLUCOSE 90 08/05/2023   GFRNONAA 21 (L) 08/05/2023   GFRAA >60 09/03/2017   CALCIUM 8.0 (L) 08/05/2023   PHOS 3.8 08/05/2023   PROT 6.0 (L) 08/02/2023   ALBUMIN 1.8 (L) 08/05/2023   BILITOT 1.7 (H) 08/02/2023   ALKPHOS 143 (H) 08/02/2023   AST 37 08/02/2023   ALT 36 08/02/2023   ANIONGAP 15 08/05/2023    GFR: Estimated Creatinine Clearance: 17.9 mL/min (A) (by C-G formula based on SCr of 2.46 mg/dL (H)).  Recent Results (from the past 240 hour(s))  Blood culture (routine x 2)     Status: None   Collection Time: 07/27/23 10:38 PM   Specimen: BLOOD RIGHT HAND  Result Value Ref Range Status   Specimen Description BLOOD RIGHT HAND BLOOD  Final   Special Requests   Final    BOTTLES DRAWN AEROBIC AND ANAEROBIC Blood Culture adequate volume   Culture   Final    NO GROWTH 5 DAYS Performed at [facility], [address] [place], [place] [address]    Report Status 08/01/2023 FINAL  Final  Blood culture (routine x 2)     Status: None   Collection Time: 07/27/23 10:49 PM   Specimen: Left Antecubital; Blood  Result Value Ref Range Status   Specimen Description LEFT ANTECUBITAL BLOOD  Final   Special Requests   Final    BOTTLES DRAWN AEROBIC AND ANAEROBIC Blood Culture results may not be optimal due to an excessive volume of  blood received in culture bottles   Culture   Final    NO GROWTH 5 DAYS Performed at [facility], [address], [place], [place] [address]    Report Status 08/01/2023 FINAL  Final  MRSA Next Gen by PCR, Nasal     Status: None   Collection Time: 07/28/23  2:16 AM   Specimen: Nasal Mucosa; Nasal Swab  Result Value Ref Range Status   MRSA by PCR Next Gen NOT DETECTED NOT DETECTED Final    Comment: (NOTE) The GeneXpert MRSA Assay (FDA approved for NASAL specimens only), is one component of a comprehensive MRSA colonization surveillance program. It is not  intended to diagnose MRSA infection nor to guide or monitor treatment for MRSA infections. Test performance is not FDA approved in patients less than 59 years old. Performed at [facility], [address], [place], [place] [address]   Culture, Respiratory w Gram Stain     Status: None   Collection Time: 07/29/23  4:13 PM   Specimen: Tracheal Aspirate; Respiratory  Result Value Ref Range Status   Specimen Description TRACHEAL ASPIRATE  Final   Special Requests NONE  Final   Gram Stain   Final    NO WBC SEEN RARE BUDDING YEAST SEEN Performed at [facility], [address] [place], [place] [address]    Culture FEW CANDIDA ALBICANS  Final   Report Status 07/31/2023 FINAL  Final  Gastrointestinal Panel by PCR , Stool     Status: None   Collection Time: 08/03/23  3:36 PM   Specimen: Stool  Result Value Ref Range Status   Campylobacter species NOT DETECTED NOT DETECTED Final   Plesimonas shigelloides NOT DETECTED NOT DETECTED Final   Salmonella species NOT DETECTED NOT DETECTED Final   Yersinia enterocolitica NOT DETECTED NOT DETECTED Final   Vibrio species NOT DETECTED NOT DETECTED Final   Vibrio cholerae NOT DETECTED NOT DETECTED Final   Enteroaggregative E coli (EAEC) NOT DETECTED NOT DETECTED Final   Enteropathogenic E coli (EPEC) NOT DETECTED NOT DETECTED Final   Enterotoxigenic E coli (ETEC) NOT DETECTED NOT DETECTED Final   Shiga like toxin producing E coli (STEC) NOT DETECTED NOT DETECTED Final   Shigella/Enteroinvasive E coli (EIEC) NOT DETECTED NOT DETECTED Final   Cryptosporidium NOT DETECTED NOT DETECTED Final   Cyclospora cayetanensis NOT DETECTED NOT DETECTED Final   Entamoeba histolytica NOT DETECTED NOT DETECTED Final   Giardia lamblia NOT DETECTED NOT DETECTED Final   Adenovirus F40/41 NOT DETECTED NOT DETECTED Final   Astrovirus NOT DETECTED NOT DETECTED Final   Norovirus GI/GII NOT DETECTED NOT DETECTED Final   Rotavirus A NOT DETECTED NOT DETECTED  Final   Sapovirus (I, II, IV, and V) NOT DETECTED NOT DETECTED Final    Comment: Performed at [facility], [address] [place], [place] [address]  C Difficile Quick Screen (NO PCR Reflex)     Status: None   Collection Time: 08/03/23  3:36 PM   Specimen: STOOL  Result Value Ref Range Status   C Diff antigen NEGATIVE NEGATIVE Final   C Diff toxin NEGATIVE NEGATIVE Final   C Diff interpretation No C. difficile detected.  Final    Comment: Performed at [facility], [address], [place], [place] [address]  Culture, blood (Routine X 2) w Reflex to ID Panel     Status: None (Preliminary result)   Collection Time: 08/03/23  7:27 PM   Specimen: BLOOD  Result  Value Ref Range Status   Specimen Description BLOOD SITE NOT SPECIFIED  Final   Special Requests   Final    BOTTLES DRAWN AEROBIC AND ANAEROBIC Blood Culture adequate volume   Culture   Final    NO GROWTH 2 DAYS Performed at [facility], [address], [place], [place] [address]    Report Status PENDING  Incomplete  Culture, blood (Routine X 2) w Reflex to ID Panel     Status: None (Preliminary result)   Collection Time: 08/03/23  7:27 PM   Specimen: BLOOD  Result Value Ref Range Status   Specimen Description BLOOD SITE NOT SPECIFIED  Final   Special Requests   Final    BOTTLES DRAWN AEROBIC AND ANAEROBIC Blood Culture results may not be optimal due to an excessive volume of blood received in culture bottles   Culture   Final    NO GROWTH 2 DAYS Performed at [facility], [address], [place], [place] [address]    Report Status PENDING  Incomplete      Radiology Studies: DG CHEST PORT 1 VIEW  Result Date: 08/04/2023 CLINICAL DATA:  Hypoxia EXAM: PORTABLE CHEST 1 VIEW COMPARISON:  07/31/2023 FINDINGS: Single frontal view of the chest demonstrates enteric catheter passing below diaphragm tip excluded by collimation. Right internal jugular catheter tip overlies the superior vena cava.  Endotracheal tube is no longer visualized. Cardiac silhouette is unremarkable. Continued areas of patchy consolidation at the lung bases, favoring atelectasis and/or scarring. Trace right pleural effusion. No pneumothorax. No acute bony abnormality. Prior healed left rib fractures. IMPRESSION: 1. Patchy bibasilar consolidation consistent with atelectasis and/or scarring. No acute airspace disease. 2. Trace right pleural effusion. 3. Support devices as above. Endotracheal tube is no longer visualized, please correlate with history of extubation. Electronically Signed   By: Sharlet Salina M.D.   On: 08/04/2023 17:32      LOS: 9 days    Jacquelin Hawking, MD [facility] 08/05/2023, 2:11 PM   If 7PM-7AM, please contact night-coverage [email or web address]

## 2023-08-05 NOTE — Progress Notes (Signed)
Pt's NG tube was laying on the bed when this RN entered the room at 1300. Pt said that she accidentally pulled it out. MD Nettey made aware. MD decided to wait and see how the pt's barium swallow goes tomorrow before deciding to replace the NG tube or not.

## 2023-08-05 NOTE — Plan of Care (Signed)

## 2023-08-06 ENCOUNTER — Inpatient Hospital Stay (HOSPITAL_COMMUNITY): Payer: Medicare HMO

## 2023-08-06 DIAGNOSIS — A419 Sepsis, unspecified organism: Secondary | ICD-10-CM | POA: Diagnosis not present

## 2023-08-06 DIAGNOSIS — D62 Acute posthemorrhagic anemia: Secondary | ICD-10-CM | POA: Diagnosis not present

## 2023-08-06 DIAGNOSIS — K922 Gastrointestinal hemorrhage, unspecified: Secondary | ICD-10-CM | POA: Diagnosis not present

## 2023-08-06 DIAGNOSIS — R932 Abnormal findings on diagnostic imaging of liver and biliary tract: Secondary | ICD-10-CM | POA: Diagnosis not present

## 2023-08-06 LAB — SODIUM: Sodium: 140 mmol/L (ref 135–145)

## 2023-08-06 LAB — RENAL FUNCTION PANEL
Albumin: 1.7 g/dL — ABNORMAL LOW (ref 3.5–5.0)
Anion gap: 17 — ABNORMAL HIGH (ref 5–15)
BUN: 36 mg/dL — ABNORMAL HIGH (ref 8–23)
CO2: 17 mmol/L — ABNORMAL LOW (ref 22–32)
Calcium: 8.3 mg/dL — ABNORMAL LOW (ref 8.9–10.3)
Chloride: 112 mmol/L — ABNORMAL HIGH (ref 98–111)
Creatinine, Ser: 2.15 mg/dL — ABNORMAL HIGH (ref 0.44–1.00)
GFR, Estimated: 25 mL/min — ABNORMAL LOW (ref >60)
Glucose, Bld: 106 mg/dL — ABNORMAL HIGH (ref 70–99)
Phosphorus: 4.1 mg/dL (ref 2.5–4.6)
Potassium: 3.2 mmol/L — ABNORMAL LOW (ref 3.5–5.1)
Sodium: 146 mmol/L — ABNORMAL HIGH (ref 135–145)

## 2023-08-06 LAB — FOLATE: Folate: 25.8 ng/mL (ref >5.9)

## 2023-08-06 LAB — VITAMIN B12: Vitamin B-12: 4522 pg/mL — ABNORMAL HIGH (ref 180–914)

## 2023-08-06 LAB — AMMONIA: Ammonia: 38 umol/L — ABNORMAL HIGH (ref 9–35)

## 2023-08-06 LAB — MAGNESIUM: Magnesium: 1.6 mg/dL — ABNORMAL LOW (ref 1.7–2.4)

## 2023-08-06 LAB — GLUCOSE, CAPILLARY
Glucose-Capillary: 89 mg/dL (ref 70–99)
Glucose-Capillary: 95 mg/dL (ref 70–99)
Glucose-Capillary: 93 mg/dL (ref 70–99)
Glucose-Capillary: 146 mg/dL — ABNORMAL HIGH (ref 70–99)
Glucose-Capillary: 96 mg/dL (ref 70–99)
Glucose-Capillary: 135 mg/dL — ABNORMAL HIGH (ref 70–99)
Glucose-Capillary: 88 mg/dL (ref 70–99)

## 2023-08-06 MED ORDER — POTASSIUM CHLORIDE 10 MEQ/100ML IV SOLN
10.0000 meq | INTRAVENOUS | Status: AC
  Administered 2023-08-06 (×3): 10 meq via INTRAVENOUS
  Filled 2023-08-06 (×3): qty 100

## 2023-08-06 MED ORDER — POLYETHYLENE GLYCOL 3350 17 G PO PACK
17.0000 g | PACK | Freq: Every day | ORAL | Status: DC
  Filled 2023-08-06 (×2): qty 1

## 2023-08-06 MED ORDER — PANTOPRAZOLE SODIUM 40 MG PO TBEC
40.0000 mg | DELAYED_RELEASE_TABLET | Freq: Two times a day (BID) | ORAL | Status: DC
  Administered 2023-08-06 – 2023-08-11 (×9): 40 mg via ORAL
  Filled 2023-08-06 (×8): qty 1

## 2023-08-06 MED ORDER — LACTULOSE 10 GM/15ML PO SOLN
20.0000 g | Freq: Two times a day (BID) | ORAL | Status: DC
  Filled 2023-08-06: qty 30

## 2023-08-06 MED ORDER — METOCLOPRAMIDE HCL 10 MG PO TABS
5.0000 mg | ORAL_TABLET | Freq: Three times a day (TID) | ORAL | Status: DC
  Administered 2023-08-06 – 2023-08-08 (×6): 5 mg via ORAL
  Filled 2023-08-06 (×6): qty 1

## 2023-08-06 MED ORDER — BISACODYL 5 MG PO TBEC: 5.0000 mg | DELAYED_RELEASE_TABLET | Freq: Every day | ORAL | Status: DC | PRN

## 2023-08-06 MED ORDER — ZINC SULFATE 220 (50 ZN) MG PO CAPS
220.0000 mg | ORAL_CAPSULE | Freq: Every day | ORAL | Status: DC
  Administered 2023-08-06 – 2023-08-10 (×5): 220 mg via ORAL
  Filled 2023-08-06 (×5): qty 1

## 2023-08-06 MED ORDER — RENA-VITE PO TABS
1.0000 | ORAL_TABLET | Freq: Every day | ORAL | Status: DC
  Administered 2023-08-06 – 2023-08-09 (×4): 1 via ORAL
  Filled 2023-08-06 (×4): qty 1

## 2023-08-06 MED ORDER — AMOXICILLIN-POT CLAVULANATE 500-125 MG PO TABS
1.0000 | ORAL_TABLET | Freq: Two times a day (BID) | ORAL | Status: DC
Start: 2023-08-06 — End: 2023-08-10
  Administered 2023-08-06 – 2023-08-10 (×9): 1 via ORAL
  Filled 2023-08-06 (×11): qty 1

## 2023-08-06 MED ORDER — MAGNESIUM SULFATE 2 GM/50ML IV SOLN
2.0000 g | Freq: Once | INTRAVENOUS | Status: AC
  Administered 2023-08-06: 2 g via INTRAVENOUS
  Filled 2023-08-06: qty 50

## 2023-08-06 MED ORDER — FOLIC ACID 1 MG PO TABS
1.0000 mg | ORAL_TABLET | Freq: Every day | ORAL | Status: DC
  Administered 2023-08-06 – 2023-08-11 (×6): 1 mg via ORAL
  Filled 2023-08-06 (×6): qty 1

## 2023-08-06 NOTE — Plan of Care (Addendum)
  Problem: Clinical Measurements: Goal: Ability to maintain clinical measurements within normal limits will improve Outcome: Progressing   

## 2023-08-06 NOTE — Evaluation (Addendum)
Modified Barium Swallow Study  Patient Details  Name: Carol Rowe MRN: [id number] Date of Birth: 05/09/1956  Today's Date: 08/06/2023  Modified Barium Swallow completed.  Full report located under Chart Review in the Imaging Section.  History of Present Illness 67 yo female presents to ED on 10/18 found down s/p fall at home with hypoglycemia and hypotension. + head trauma but CTH negative for acute findings. Workup for shock. Pt admitted to ICU 10/19 for pressor support, intubation, ETT 10/19-10/22, CRRT 10/19-10/22. EEG 10/23 suggestive of moderate diffuse encephalopathy. Pt also with mild rhabdo. Hospitalization further complicated by encephalopathy and ileus. PMH includes ETOH abuse, cirrhosis, HTN, COPD, smoker, gout.   Clinical Impression Pt has a cognitively based oral dysphagia wtih relatively functional pharyngeal function. She has oral holding that fluctuates in duration, but does often require cues from SLP to swallow. When she initiates posterior transit, her lingual motion is swift. There is oral residue across consistencies that does spill into her pharynx after the swallow, but there is no additional residue originating in the pharynx as her pharyngeal strength is good. She has intermittent, trace penetration with thin and nectar thick liquids (PAS 2) due to impaired timing, but her laryngeal vestibule closure is effective enough to clear penetrates as she swallows. No aspiration is observed. Discussed with RN, who was present for the study: suspect that her mentation may be her biggest limiting factor when it comes to oral nutrition. Recomemnd starting with Dys 2 (minced) diet and thin liquids to facilitate oral preparation. If nursing finds that this is still too effortful and prolonged at meal times, could downgrade to purees pending SLP f/u. Per MD, given concern for ileus this admission, will start with CLD for today (thin liquids) and advance to Dys 2 within the next day or so  if tolerating.   Factors that may increase risk of adverse event in presence of aspiration Carol Rowe & Clearance Carol Rowe 2021): Reduced cognitive function;Limited mobility  Swallow Evaluation Recommendations Recommendations: PO diet PO Diet Recommendation: Dysphagia 2 (Finely chopped);Thin liquids (Level 0) Liquid Administration via: Cup;Straw Medication Administration: Whole meds with liquid (can consider purees PRN if oral holdingi s observed clinically) Supervision: Staff to assist with self-feeding;Full assist for feeding Swallowing strategies  : Minimize environmental distractions;Slow rate;Small bites/sips Postural changes: Position pt fully upright for meals Oral care recommendations: Oral care BID (2x/day)      Mahala Menghini., M.A. CCC-SLP Acute Rehabilitation Services Office [phone]  Secure chat preferred  08/06/2023,9:03 AM

## 2023-08-06 NOTE — NC FL2 (Signed)
[place] MEDICAID FL2 LEVEL OF CARE FORM     IDENTIFICATION  Patient Name: Carol Rowe Birthdate: 1956-09-18 Sex: female Admission Date (Current Location): 07/27/2023  [facility] and [place] Number:  [employer] and Address:  [facility] [place]. [facility], [address], [place], [place] [address]      Provider Number: [id number]  Attending Physician Name and Address:  Narda Bonds, MD  Relative Name and Phone Number:       Current Level of Care: Hospital Recommended Level of Care: Skilled Nursing Facility Prior Approval Number:    Date Approved/Denied:   PASRR Number:  [id number]  Discharge Plan: SNF    Current Diagnoses: Patient Active Problem List   Diagnosis Date Noted   Anemia, posthemorrhagic, acute 08/04/2023   Abnormal finding on imaging of liver 08/04/2023   Lower GI bleed 08/03/2023   Protein-calorie malnutrition, severe 08/02/2023   Septic shock (HCC) 07/28/2023   Shock (HCC) 07/27/2023   Special screening for malignant neoplasms, colon 09/29/2019   Family hx of colon cancer 09/29/2019   Leukocytosis 09/03/2017   Thrombocytosis 09/03/2017   Chest tightness or pressure 08/29/2012   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   CONSTIPATION, CHRONIC 01/05/2009   NAUSEA, CHRONIC 01/05/2009   EPIGASTRIC PAIN 01/05/2009   MELANOMA 01/04/2009   ANXIETY 01/04/2009   CIGARETTE SMOKER 01/04/2009   DEPRESSION 01/04/2009   HYPERTENSION 01/04/2009   GERD 01/04/2009   ARTHRITIS 01/04/2009   OSTEOPOROSIS 01/04/2009   DIVERTICULITIS, HX OF 01/04/2009    Orientation RESPIRATION BLADDER Height & Weight     Self, Place, Situation  Normal Incontinent, Indwelling catheter Weight: 109 lb 9.1 oz (49.7 kg) Height:  5\' 3"  (160 cm)  BEHAVIORAL SYMPTOMS/MOOD NEUROLOGICAL BOWEL NUTRITION STATUS      Incontinent (rectal tube) Diet (See dc summary)  AMBULATORY STATUS COMMUNICATION OF NEEDS Skin   Extensive Assist Verbally Other (Comment) (wound on arm)                        Personal Care Assistance Level of Assistance  Bathing, Feeding, Dressing Bathing Assistance: Maximum assistance Feeding assistance: Limited assistance Dressing Assistance: Maximum assistance     Functional Limitations Info             SPECIAL CARE FACTORS FREQUENCY  PT (By licensed PT), OT (By licensed OT)     PT Frequency: 5x/week OT Frequency: 5x/week            Contractures Contractures Info: Not present    Additional Factors Info  Code Status, Allergies, Insulin Sliding Scale Code Status Info: Full Allergies Info: Diclofenac, Macrobid (Nitrofurantoin Macrocrystal), Mirtazapine   Insulin Sliding Scale Info: See dc summary       Current Medications (08/06/2023):  This is the current hospital active medication list Current Facility-Administered Medications  Medication Dose Route Frequency Provider Last Rate Last Admin   0.9 %  sodium chloride infusion (Manually program via Guardrails IV Fluids)   Intravenous Once Bowser, Kaylyn Layer, NP       amoxicillin-clavulanate (AUGMENTIN) 500-125 MG per tablet 1 tablet  1 tablet Oral BID Narda Bonds, MD   1 tablet at 08/06/23 1245   bisacodyl (DULCOLAX) EC tablet 5 mg  5 mg Oral Daily PRN Wilmer Floor, RPH       calcium carbonate (TUMS - dosed in mg elemental calcium) chewable tablet 200 mg of elemental calcium  1 tablet Per Tube BID Karie Fetch P, DO   200 mg  of elemental calcium at 08/06/23 1039   Chlorhexidine Gluconate Cloth 2 % PADS 6 each  6 each Topical Daily Assaker, [place], MD   6 each at 08/06/23 1040   dextrose 5 % and 0.45 % NaCl infusion   Intravenous Continuous Narda Bonds, MD 40 mL/hr at 08/06/23 1200 Infusion Verify at 08/06/23 1200   docusate sodium (COLACE) capsule 100 mg  100 mg Oral BID PRN Assaker, [place], MD       folic acid (FOLVITE) tablet 1 mg  1 mg Oral Daily Narda Bonds, MD   1 mg at 08/06/23 1039   insulin aspart (novoLOG) injection 0-15 Units  0-15 Units  Subcutaneous Q4H Icard, Bradley L, DO   2 Units at 08/06/23 1203   lactulose (CHRONULAC) 10 GM/15ML solution 20 g  20 g Oral BID Narda Bonds, MD       lidocaine (XYLOCAINE) 1 % (with pres) injection 10 mL  10 mL Intradermal Once Assaker, [place], MD       lip balm (CARMEX) ointment   Topical PRN Narda Bonds, MD       metoCLOPramide (REGLAN) tablet 5 mg  5 mg Oral TID AC Wilmer Floor, RPH   5 mg at 08/06/23 1202   multivitamin (RENA-VIT) tablet 1 tablet  1 tablet Oral QHS Narda Bonds, MD       ondansetron [facility]) injection 4 mg  4 mg Intravenous Q6H PRN Icard, Bradley L, DO   4 mg at 07/31/23 1841   Oral care mouth rinse  15 mL Mouth Rinse PRN Assaker, [place], MD       pantoprazole (PROTONIX) EC tablet 40 mg  40 mg Oral BID AC Wilmer Floor, RPH       polyethylene glycol (MIRALAX / GLYCOLAX) packet 17 g  17 g Oral Daily Narda Bonds, MD       spironolactone (ALDACTONE) tablet 12.5 mg  12.5 mg Oral Daily Icard, Bradley L, DO   12.5 mg at 08/06/23 1039   thiamine (VITAMIN B1) tablet 100 mg  100 mg Oral Daily Assaker, [place], MD   100 mg at 08/05/23 4098   Or   thiamine (VITAMIN B1) injection 100 mg  100 mg Intravenous Daily Assaker, [place], MD   100 mg at 08/06/23 1039   zinc sulfate capsule 220 mg  220 mg Oral Daily Narda Bonds, MD   220 mg at 08/06/23 1039     Discharge Medications: Please see discharge summary for a list of discharge medications.  Relevant Imaging Results:  Relevant Lab Results:   Additional Information SSN: [id number] [place], [place]

## 2023-08-06 NOTE — Plan of Care (Signed)

## 2023-08-06 NOTE — Progress Notes (Signed)
Physical Therapy Treatment Patient Details Name: Carol Rowe MRN: [id number] DOB: [birth year] Today's Date: 08/06/2023   History of Present Illness 67 yo female presents to ED on 10/18 found down s/p fall at home with hypoglycemia and hypotension. + head trauma but CTH negative for acute findings. Workup for Rowe. Pt admitted to ICU 10/19 for pressor support, intubation, ETT 10/19-10/22, CRRT 10/19-10/22. EEG 10/23 suggestive of moderate diffuse encephalopathy. Pt also with mild rhabdo. PMH includes ETOH abuse, cirrhosis, HTN, COPD, smoker, gout.    PT Comments  Pt with improved cognition and functional mobility this date. Pt alert however confused but demo'd improved ability to follow commands and ability to transfer OOB this date with assistx2. Continue to recommend inpatient rehab < 3 hrs/day to maximize function. Acute PT to cont to follow.    If plan is discharge home, recommend the following: Two people to help with walking and/or transfers;Two people to help with bathing/dressing/bathroom   Can travel by private vehicle        Equipment Recommendations  None recommended by PT    Recommendations for Other Services       Precautions / Restrictions Precautions Precautions: Fall Precaution Comments: NGT, a-line LUE, CVP jugular Restrictions Weight Bearing Restrictions: No     Mobility  Bed Mobility Overal bed mobility: Needs Assistance Bed Mobility: Supine to Sit, Rolling     Supine to sit: Mod assist, HOB elevated, Max assist     General bed mobility comments: max verbal and tactile cues to move LEs to EOB with minA, maxA for trunk elevation and to scoot to EOB    Transfers Overall transfer level: Needs assistance Equipment used: 2 person hand held assist (face to face transfer with gait belt) Transfers: Sit to/from Stand, Bed to chair/wheelchair/BSC Sit to Stand: Mod assist, +2 physical assistance   Step pivot transfers: Max assist, +2 physical assistance        General transfer comment: pt with noted anxiety [id number] however responded well to therapist. Pt initally maxAx2 to stand with bilat knees blocked, pt returned to sitting almost immeadiately. on second attempt pt stood up with modAX2 and knees not blocked. max directional verbal cues to sequencing stepping to the recliner, noted instability    Ambulation/Gait               General Gait Details: took 3 steps to the chair   Stairs             Wheelchair Mobility     Tilt Bed    Modified Rankin (Stroke Patients Only)       Balance Overall balance assessment: Needs assistance Sitting-balance support: Feet supported, Bilateral upper extremity supported Sitting balance-Leahy Scale: Poor Sitting balance - Comments: pt improved stability, uses bilat UE to support self, modA for dynamic sitting support   Standing balance support: Bilateral upper extremity supported, During functional activity Standing balance-Leahy Scale: Poor Standing balance comment: dependent on external support                            Cognition Arousal: Alert Behavior During Therapy: WFL for tasks assessed/performed Overall Cognitive Status: No family/caregiver present to determine baseline cognitive functioning Area of Impairment: Orientation, Attention, Memory, Following commands, Safety/judgement, Problem solving, Awareness                 Orientation Level: Disoriented to, Situation, Place, Time (states hospital but repeatedly states [facility] despite multiple corrections) Current Attention  Level: Focused Memory: Decreased short-term memory Following Commands: Follows one step commands with increased time, Follows one step commands inconsistently Safety/Judgement: Decreased awareness of safety, Decreased awareness of deficits Awareness: Intellectual Problem Solving: Slow processing, Decreased initiation, Requires tactile cues, Difficulty sequencing, Requires  verbal cues General Comments: pt with improved mentation however continues to be confused with delayed response time and difficulty sequencing        Exercises      General Comments General comments (skin integrity, edema, etc.): VSS      Pertinent Vitals/Pain Pain Assessment Pain Assessment: Faces Faces Pain Scale: No hurt Facial Expression: Grimacing Pain Location: "all over", especially with LE handling Pain Descriptors / Indicators: Discomfort, Guarding, Grimacing    Home Living                          Prior Function            PT Goals (current goals can now be found in the care plan section) Acute Rehab PT Goals PT Goal Formulation: Patient unable to participate in goal setting Time For Goal Achievement: 08/16/23 Potential to Achieve Goals: Good Progress towards PT goals: Progressing toward goals    Frequency    Min 1X/week      PT Plan      Co-evaluation              AM-PAC PT "6 Clicks" Mobility   Outcome Measure  Help needed turning from your back to your side while in a flat bed without using bedrails?: A Lot Help needed moving from lying on your back to sitting on the side of a flat bed without using bedrails?: A Lot Help needed moving to and from a bed to a chair (including a wheelchair)?: A Lot Help needed standing up from a chair using your arms (e.g., wheelchair or bedside chair)?: A Lot Help needed to walk in hospital room?: Total Help needed climbing 3-5 steps with a railing? : Total 6 Click Score: 10    End of Session Equipment Utilized During Treatment: Gait belt Activity Tolerance: Patient tolerated treatment well Patient left: with call bell/phone within reach;with nursing/sitter in room;in chair;with chair alarm set Nurse Communication: Mobility status PT Visit Diagnosis: Other abnormalities of gait and mobility (R26.89);Muscle weakness (generalized) (M62.81)     Time: 4098-1191 PT Time Calculation (min) (ACUTE  ONLY): 22 min  Charges:    $Therapeutic Activity: 8-22 mins PT General Charges $$ ACUTE PT VISIT: 1 Visit                     Carol Rowe, PT, DPT Acute Rehabilitation Services Secure chat preferred Office #: [phone]    Carol Rowe 08/06/2023, 1:23 PM

## 2023-08-06 NOTE — Progress Notes (Signed)
[name] [id number]  Culture, blood (Routine X 2) w Reflex to ID Panel     Status: None (Preliminary result)   Collection Time: 08/03/23  7:27 PM   Specimen: BLOOD  Result Value Ref Range Status   Specimen Description BLOOD SITE NOT SPECIFIED  Final   Special Requests   Final    BOTTLES DRAWN AEROBIC AND ANAEROBIC Blood Culture adequate volume   Culture   Final    NO GROWTH 3 DAYS Performed at [facility], [address], [place], [place] [address]    Report Status PENDING  Incomplete  Culture, blood (Routine X 2) w Reflex to ID Panel     Status: None (Preliminary result)   Collection Time: 08/03/23  7:27 PM   Specimen: BLOOD  Result Value Ref Range Status   Specimen Description BLOOD SITE NOT SPECIFIED  Final   Special Requests   Final    BOTTLES DRAWN AEROBIC AND ANAEROBIC Blood Culture results may not be optimal due to an excessive volume of blood received in culture bottles   Culture   Final    NO GROWTH 3 DAYS Performed at [facility], [address] [place], [place] [address]    Report Status PENDING  Incomplete      Radiology Studies: DG Swallowing Func-Speech Pathology  Result Date:  08/06/2023 Table formatting from the original result was not included. Modified Barium Swallow Study Patient Details Name: Carol Rowe MRN: [id number] Date of Birth: 08/18/1956 Today's Date: 08/06/2023 HPI/PMH: HPI: 67 yo female presents to ED on 10/18 found down s/p fall at home with hypoglycemia and hypotension. + head trauma but CTH negative for acute findings. Workup for shock. Pt admitted to ICU 10/19 for pressor support, intubation, ETT 10/19-10/22, CRRT 10/19-10/22. EEG 10/23 suggestive of moderate diffuse encephalopathy. Pt also with mild rhabdo. Hospitalization further complicated by encephalopathy and ileus. PMH includes ETOH abuse, cirrhosis, HTN, COPD, smoker, gout. Clinical Impression: Clinical Impression: Pt has a cognitively based oral dysphagia wtih relatively functional pharyngeal function. She has oral holding that fluctuates in duration, but does often require cues from SLP to swallow. When she initiates posterior transit, her lingual motion is swift. There is oral residue across consistencies that does spill into her pharynx after the swallow, but there is no additional residue originating in the pharynx as her pharyngeal strength is good. She has intermittent, trace penetration with thin and nectar thick liquids (PAS 2) due to impaired timing, but her laryngeal vestibule closure is effective enough to clear penetrates as she swallows. No aspiration is observed. Discussed with RN, who was present for the study: suspect that her mentation may be her biggest limiting factor when it comes to oral nutrition. Recomemnd starting with Dys 2 (minced) diet and thin liquids to facilitate oral preparation. If nursing finds that this is still too effortful and prolonged at meal times, could downgrade to purees pending SLP f/u. Per MD, given concern for ileus this admission, will start with CLD for today (thin liquids) and advance to Dys 2 within the next day or so if tolerating. Factors that may increase  risk of adverse event in presence of aspiration [name] & Clearance Coots 2021): Factors that may increase risk of adverse event in presence of aspiration [name] & Clearance Coots 2021): Reduced cognitive function; Limited mobility Recommendations/Plan: Swallowing Evaluation Recommendations Swallowing Evaluation Recommendations Recommendations: PO diet PO Diet Recommendation: Dysphagia 2 (Finely chopped); Thin liquids (Level 0) Liquid Administration via: Cup; Straw Medication Administration: Whole meds with  [name] [id number]  Culture, blood (Routine X 2) w Reflex to ID Panel     Status: None (Preliminary result)   Collection Time: 08/03/23  7:27 PM   Specimen: BLOOD  Result Value Ref Range Status   Specimen Description BLOOD SITE NOT SPECIFIED  Final   Special Requests   Final    BOTTLES DRAWN AEROBIC AND ANAEROBIC Blood Culture adequate volume   Culture   Final    NO GROWTH 3 DAYS Performed at [facility], [address], [place], [place] [address]    Report Status PENDING  Incomplete  Culture, blood (Routine X 2) w Reflex to ID Panel     Status: None (Preliminary result)   Collection Time: 08/03/23  7:27 PM   Specimen: BLOOD  Result Value Ref Range Status   Specimen Description BLOOD SITE NOT SPECIFIED  Final   Special Requests   Final    BOTTLES DRAWN AEROBIC AND ANAEROBIC Blood Culture results may not be optimal due to an excessive volume of blood received in culture bottles   Culture   Final    NO GROWTH 3 DAYS Performed at [facility], [address] [place], [place] [address]    Report Status PENDING  Incomplete      Radiology Studies: DG Swallowing Func-Speech Pathology  Result Date:  08/06/2023 Table formatting from the original result was not included. Modified Barium Swallow Study Patient Details Name: Carol Rowe MRN: [id number] Date of Birth: 08/18/1956 Today's Date: 08/06/2023 HPI/PMH: HPI: 67 yo female presents to ED on 10/18 found down s/p fall at home with hypoglycemia and hypotension. + head trauma but CTH negative for acute findings. Workup for shock. Pt admitted to ICU 10/19 for pressor support, intubation, ETT 10/19-10/22, CRRT 10/19-10/22. EEG 10/23 suggestive of moderate diffuse encephalopathy. Pt also with mild rhabdo. Hospitalization further complicated by encephalopathy and ileus. PMH includes ETOH abuse, cirrhosis, HTN, COPD, smoker, gout. Clinical Impression: Clinical Impression: Pt has a cognitively based oral dysphagia wtih relatively functional pharyngeal function. She has oral holding that fluctuates in duration, but does often require cues from SLP to swallow. When she initiates posterior transit, her lingual motion is swift. There is oral residue across consistencies that does spill into her pharynx after the swallow, but there is no additional residue originating in the pharynx as her pharyngeal strength is good. She has intermittent, trace penetration with thin and nectar thick liquids (PAS 2) due to impaired timing, but her laryngeal vestibule closure is effective enough to clear penetrates as she swallows. No aspiration is observed. Discussed with RN, who was present for the study: suspect that her mentation may be her biggest limiting factor when it comes to oral nutrition. Recomemnd starting with Dys 2 (minced) diet and thin liquids to facilitate oral preparation. If nursing finds that this is still too effortful and prolonged at meal times, could downgrade to purees pending SLP f/u. Per MD, given concern for ileus this admission, will start with CLD for today (thin liquids) and advance to Dys 2 within the next day or so if tolerating. Factors that may increase  risk of adverse event in presence of aspiration [name] & Clearance Coots 2021): Factors that may increase risk of adverse event in presence of aspiration [name] & Clearance Coots 2021): Reduced cognitive function; Limited mobility Recommendations/Plan: Swallowing Evaluation Recommendations Swallowing Evaluation Recommendations Recommendations: PO diet PO Diet Recommendation: Dysphagia 2 (Finely chopped); Thin liquids (Level 0) Liquid Administration via: Cup; Straw Medication Administration: Whole meds with  PROGRESS NOTE    Carol Rowe  XBM:[id number] DOB: [birth year] DOA: 07/27/2023 PCP: Benita Stabile, MD   Brief Narrative: Carol Rowe is a 67 y.o. female with a history of primary hypertension, COPD, alcohol use disorder, hyperlipidemia.  Patient presented secondary to septic shock from [facility].  Patient admitted to ICU for vasopressor support in addition to need for intubation and mechanical ventilation.  Patient treated with antibiotics for pneumonia.  Hospitalization was complicated by development of AKI requiring initiation of CRRT while in ICU.  Hospitalization further complicated by encephalopathy and ileus.  Patient with NG tube.   Assessment and Plan:  Septic shock, resolved Present on admission. Presumed secondary to right lower lobe pneumonia. Patient admitted to the ICU for vasopressor support and stress dose steroids. Blood cultures obtained and empiric antibiotics started. Vasopressors weaned off on 10/22.  Right lower lobe pneumonia, resolved Patient completed treatment with Zosyn.  Acute respiratory failure with hypoxia, resolved Patient required intubation and mechanical ventilation from 10/19 until 10/22. Patient now on supplemental oxygen via nasal canula. Weaned to room air.  Fever, resolved Tmax of 101.7 F. WBC is still. Patient has a recent pneumonia which was treated but has possible re-aspiration events. Patient with bloody stools which could indicate GI infectious source. Procalcitonin obtained and is elevated at 6.41, trended down to 5.72. Chest x-ray without airspace disease. Afebrile overnight. GI pathogen panel negative. -Follow-up nblood cultures -Discontinue Zosyn and switch to Augmentin (renally dosed)  Thrombocytopenia, resolved Likely reactive and secondary to sepsis. Platelets have recovered. Resolved.  GI bleeding, resolved New diagnosis. Patient has required blood transfusion. Red/maroon blood with clots noted. GI consulted with concern for  possible infectious vs ischemic colitis. Low suspicion for upper GI bleeding. C. Difficile and GI pathogen panel obtained and are negative. Bleeding appears to have significantly improved. GI recommendation for no endoscopic evaluation while inpatient, but to consider upper and lower endoscopies as an outpatient.  Chronic anemia Acute blood loss anemia, resolved Likely secondary to GI bleeding. Patient has required a total of 1 unit of PRBC via transfusion. Hemoglobin is stable.  AKI Patient was oliguric this admission. Likely related to ATN from profound hypotension from septic shock while on an ACEi. Nephrology consulted and initiated CRRT while patient was in the ICU on 10/19, which was discontinued on 10/22 via right femoral temporary HD catheter. Renal function appears to continue improving. Nephrology signed off on 10/26.  Hyponatremia Severe. Resolved with IV fluids and management through CRRT.  Hypernatremia Secondary to poor oral intake -Repeat sodium check this afternoon -Encourage PO intake  Hypokalemia -Supplementation  High anion gap metabolic acidosis In relation to AKI. Improved with improvement of AKI. Acidosis worsened slightly again.  Hypophosphatemia Resolved.  Hypocalcemia Resolved.  Possible cirrhosis Diagnosis does not appear to be accurate. No cirrhosis on CT imaging.  Coagulopathy In setting of acute illness. Question of underlying liver disease.  Hyperammonemia In setting of question of underlying liver disease. Ammonia level within normal limits.  Possible toxic encephalopathy Per chart review, concern encephalopathy was secondary to sedation. CT head negative for acute process. EEG obtained and was negative for evidence of seizure. Encephalopathy improving.  Ileus NG tube previously used for tube feeds with transition to intermittent suction. NG tube clamped on 10/25 with no development of nausea/vomiting. Patient has ongoing bowel function with  flatus and bowel movements. She has tolerated a clamping trial. Plan was to attempt PO intake vs tube feed initiation, however MBS scheduled for 10/28  PROGRESS NOTE    Carol Rowe  XBM:[id number] DOB: [birth year] DOA: 07/27/2023 PCP: Benita Stabile, MD   Brief Narrative: Carol Rowe is a 67 y.o. female with a history of primary hypertension, COPD, alcohol use disorder, hyperlipidemia.  Patient presented secondary to septic shock from [facility].  Patient admitted to ICU for vasopressor support in addition to need for intubation and mechanical ventilation.  Patient treated with antibiotics for pneumonia.  Hospitalization was complicated by development of AKI requiring initiation of CRRT while in ICU.  Hospitalization further complicated by encephalopathy and ileus.  Patient with NG tube.   Assessment and Plan:  Septic shock, resolved Present on admission. Presumed secondary to right lower lobe pneumonia. Patient admitted to the ICU for vasopressor support and stress dose steroids. Blood cultures obtained and empiric antibiotics started. Vasopressors weaned off on 10/22.  Right lower lobe pneumonia, resolved Patient completed treatment with Zosyn.  Acute respiratory failure with hypoxia, resolved Patient required intubation and mechanical ventilation from 10/19 until 10/22. Patient now on supplemental oxygen via nasal canula. Weaned to room air.  Fever, resolved Tmax of 101.7 F. WBC is still. Patient has a recent pneumonia which was treated but has possible re-aspiration events. Patient with bloody stools which could indicate GI infectious source. Procalcitonin obtained and is elevated at 6.41, trended down to 5.72. Chest x-ray without airspace disease. Afebrile overnight. GI pathogen panel negative. -Follow-up nblood cultures -Discontinue Zosyn and switch to Augmentin (renally dosed)  Thrombocytopenia, resolved Likely reactive and secondary to sepsis. Platelets have recovered. Resolved.  GI bleeding, resolved New diagnosis. Patient has required blood transfusion. Red/maroon blood with clots noted. GI consulted with concern for  possible infectious vs ischemic colitis. Low suspicion for upper GI bleeding. C. Difficile and GI pathogen panel obtained and are negative. Bleeding appears to have significantly improved. GI recommendation for no endoscopic evaluation while inpatient, but to consider upper and lower endoscopies as an outpatient.  Chronic anemia Acute blood loss anemia, resolved Likely secondary to GI bleeding. Patient has required a total of 1 unit of PRBC via transfusion. Hemoglobin is stable.  AKI Patient was oliguric this admission. Likely related to ATN from profound hypotension from septic shock while on an ACEi. Nephrology consulted and initiated CRRT while patient was in the ICU on 10/19, which was discontinued on 10/22 via right femoral temporary HD catheter. Renal function appears to continue improving. Nephrology signed off on 10/26.  Hyponatremia Severe. Resolved with IV fluids and management through CRRT.  Hypernatremia Secondary to poor oral intake -Repeat sodium check this afternoon -Encourage PO intake  Hypokalemia -Supplementation  High anion gap metabolic acidosis In relation to AKI. Improved with improvement of AKI. Acidosis worsened slightly again.  Hypophosphatemia Resolved.  Hypocalcemia Resolved.  Possible cirrhosis Diagnosis does not appear to be accurate. No cirrhosis on CT imaging.  Coagulopathy In setting of acute illness. Question of underlying liver disease.  Hyperammonemia In setting of question of underlying liver disease. Ammonia level within normal limits.  Possible toxic encephalopathy Per chart review, concern encephalopathy was secondary to sedation. CT head negative for acute process. EEG obtained and was negative for evidence of seizure. Encephalopathy improving.  Ileus NG tube previously used for tube feeds with transition to intermittent suction. NG tube clamped on 10/25 with no development of nausea/vomiting. Patient has ongoing bowel function with  flatus and bowel movements. She has tolerated a clamping trial. Plan was to attempt PO intake vs tube feed initiation, however MBS scheduled for 10/28  PROGRESS NOTE    Carol Rowe  XBM:[id number] DOB: [birth year] DOA: 07/27/2023 PCP: Benita Stabile, MD   Brief Narrative: Carol Rowe is a 67 y.o. female with a history of primary hypertension, COPD, alcohol use disorder, hyperlipidemia.  Patient presented secondary to septic shock from [facility].  Patient admitted to ICU for vasopressor support in addition to need for intubation and mechanical ventilation.  Patient treated with antibiotics for pneumonia.  Hospitalization was complicated by development of AKI requiring initiation of CRRT while in ICU.  Hospitalization further complicated by encephalopathy and ileus.  Patient with NG tube.   Assessment and Plan:  Septic shock, resolved Present on admission. Presumed secondary to right lower lobe pneumonia. Patient admitted to the ICU for vasopressor support and stress dose steroids. Blood cultures obtained and empiric antibiotics started. Vasopressors weaned off on 10/22.  Right lower lobe pneumonia, resolved Patient completed treatment with Zosyn.  Acute respiratory failure with hypoxia, resolved Patient required intubation and mechanical ventilation from 10/19 until 10/22. Patient now on supplemental oxygen via nasal canula. Weaned to room air.  Fever, resolved Tmax of 101.7 F. WBC is still. Patient has a recent pneumonia which was treated but has possible re-aspiration events. Patient with bloody stools which could indicate GI infectious source. Procalcitonin obtained and is elevated at 6.41, trended down to 5.72. Chest x-ray without airspace disease. Afebrile overnight. GI pathogen panel negative. -Follow-up nblood cultures -Discontinue Zosyn and switch to Augmentin (renally dosed)  Thrombocytopenia, resolved Likely reactive and secondary to sepsis. Platelets have recovered. Resolved.  GI bleeding, resolved New diagnosis. Patient has required blood transfusion. Red/maroon blood with clots noted. GI consulted with concern for  possible infectious vs ischemic colitis. Low suspicion for upper GI bleeding. C. Difficile and GI pathogen panel obtained and are negative. Bleeding appears to have significantly improved. GI recommendation for no endoscopic evaluation while inpatient, but to consider upper and lower endoscopies as an outpatient.  Chronic anemia Acute blood loss anemia, resolved Likely secondary to GI bleeding. Patient has required a total of 1 unit of PRBC via transfusion. Hemoglobin is stable.  AKI Patient was oliguric this admission. Likely related to ATN from profound hypotension from septic shock while on an ACEi. Nephrology consulted and initiated CRRT while patient was in the ICU on 10/19, which was discontinued on 10/22 via right femoral temporary HD catheter. Renal function appears to continue improving. Nephrology signed off on 10/26.  Hyponatremia Severe. Resolved with IV fluids and management through CRRT.  Hypernatremia Secondary to poor oral intake -Repeat sodium check this afternoon -Encourage PO intake  Hypokalemia -Supplementation  High anion gap metabolic acidosis In relation to AKI. Improved with improvement of AKI. Acidosis worsened slightly again.  Hypophosphatemia Resolved.  Hypocalcemia Resolved.  Possible cirrhosis Diagnosis does not appear to be accurate. No cirrhosis on CT imaging.  Coagulopathy In setting of acute illness. Question of underlying liver disease.  Hyperammonemia In setting of question of underlying liver disease. Ammonia level within normal limits.  Possible toxic encephalopathy Per chart review, concern encephalopathy was secondary to sedation. CT head negative for acute process. EEG obtained and was negative for evidence of seizure. Encephalopathy improving.  Ileus NG tube previously used for tube feeds with transition to intermittent suction. NG tube clamped on 10/25 with no development of nausea/vomiting. Patient has ongoing bowel function with  flatus and bowel movements. She has tolerated a clamping trial. Plan was to attempt PO intake vs tube feed initiation, however MBS scheduled for 10/28  PROGRESS NOTE    Carol Rowe  XBM:[id number] DOB: [birth year] DOA: 07/27/2023 PCP: Benita Stabile, MD   Brief Narrative: Carol Rowe is a 67 y.o. female with a history of primary hypertension, COPD, alcohol use disorder, hyperlipidemia.  Patient presented secondary to septic shock from [facility].  Patient admitted to ICU for vasopressor support in addition to need for intubation and mechanical ventilation.  Patient treated with antibiotics for pneumonia.  Hospitalization was complicated by development of AKI requiring initiation of CRRT while in ICU.  Hospitalization further complicated by encephalopathy and ileus.  Patient with NG tube.   Assessment and Plan:  Septic shock, resolved Present on admission. Presumed secondary to right lower lobe pneumonia. Patient admitted to the ICU for vasopressor support and stress dose steroids. Blood cultures obtained and empiric antibiotics started. Vasopressors weaned off on 10/22.  Right lower lobe pneumonia, resolved Patient completed treatment with Zosyn.  Acute respiratory failure with hypoxia, resolved Patient required intubation and mechanical ventilation from 10/19 until 10/22. Patient now on supplemental oxygen via nasal canula. Weaned to room air.  Fever, resolved Tmax of 101.7 F. WBC is still. Patient has a recent pneumonia which was treated but has possible re-aspiration events. Patient with bloody stools which could indicate GI infectious source. Procalcitonin obtained and is elevated at 6.41, trended down to 5.72. Chest x-ray without airspace disease. Afebrile overnight. GI pathogen panel negative. -Follow-up nblood cultures -Discontinue Zosyn and switch to Augmentin (renally dosed)  Thrombocytopenia, resolved Likely reactive and secondary to sepsis. Platelets have recovered. Resolved.  GI bleeding, resolved New diagnosis. Patient has required blood transfusion. Red/maroon blood with clots noted. GI consulted with concern for  possible infectious vs ischemic colitis. Low suspicion for upper GI bleeding. C. Difficile and GI pathogen panel obtained and are negative. Bleeding appears to have significantly improved. GI recommendation for no endoscopic evaluation while inpatient, but to consider upper and lower endoscopies as an outpatient.  Chronic anemia Acute blood loss anemia, resolved Likely secondary to GI bleeding. Patient has required a total of 1 unit of PRBC via transfusion. Hemoglobin is stable.  AKI Patient was oliguric this admission. Likely related to ATN from profound hypotension from septic shock while on an ACEi. Nephrology consulted and initiated CRRT while patient was in the ICU on 10/19, which was discontinued on 10/22 via right femoral temporary HD catheter. Renal function appears to continue improving. Nephrology signed off on 10/26.  Hyponatremia Severe. Resolved with IV fluids and management through CRRT.  Hypernatremia Secondary to poor oral intake -Repeat sodium check this afternoon -Encourage PO intake  Hypokalemia -Supplementation  High anion gap metabolic acidosis In relation to AKI. Improved with improvement of AKI. Acidosis worsened slightly again.  Hypophosphatemia Resolved.  Hypocalcemia Resolved.  Possible cirrhosis Diagnosis does not appear to be accurate. No cirrhosis on CT imaging.  Coagulopathy In setting of acute illness. Question of underlying liver disease.  Hyperammonemia In setting of question of underlying liver disease. Ammonia level within normal limits.  Possible toxic encephalopathy Per chart review, concern encephalopathy was secondary to sedation. CT head negative for acute process. EEG obtained and was negative for evidence of seizure. Encephalopathy improving.  Ileus NG tube previously used for tube feeds with transition to intermittent suction. NG tube clamped on 10/25 with no development of nausea/vomiting. Patient has ongoing bowel function with  flatus and bowel movements. She has tolerated a clamping trial. Plan was to attempt PO intake vs tube feed initiation, however MBS scheduled for 10/28

## 2023-08-07 DIAGNOSIS — R579 Shock, unspecified: Secondary | ICD-10-CM | POA: Diagnosis not present

## 2023-08-07 DIAGNOSIS — E871 Hypo-osmolality and hyponatremia: Secondary | ICD-10-CM | POA: Diagnosis not present

## 2023-08-07 DIAGNOSIS — E876 Hypokalemia: Secondary | ICD-10-CM | POA: Diagnosis not present

## 2023-08-07 DIAGNOSIS — G9340 Encephalopathy, unspecified: Secondary | ICD-10-CM | POA: Diagnosis not present

## 2023-08-07 LAB — COMPREHENSIVE METABOLIC PANEL
ALT: 28 U/L (ref 0–44)
AST: 21 U/L (ref 15–41)
Albumin: 1.6 g/dL — ABNORMAL LOW (ref 3.5–5.0)
Alkaline Phosphatase: 157 U/L — ABNORMAL HIGH (ref 38–126)
Anion gap: 10 (ref 5–15)
BUN: 24 mg/dL — ABNORMAL HIGH (ref 8–23)
CO2: 16 mmol/L — ABNORMAL LOW (ref 22–32)
Calcium: 8.1 mg/dL — ABNORMAL LOW (ref 8.9–10.3)
Chloride: 112 mmol/L — ABNORMAL HIGH (ref 98–111)
Creatinine, Ser: 1.97 mg/dL — ABNORMAL HIGH (ref 0.44–1.00)
GFR, Estimated: 28 mL/min — ABNORMAL LOW (ref >60)
Glucose, Bld: 120 mg/dL — ABNORMAL HIGH (ref 70–99)
Potassium: 2.8 mmol/L — ABNORMAL LOW (ref 3.5–5.1)
Sodium: 138 mmol/L (ref 135–145)
Total Bilirubin: 1.1 mg/dL (ref 0.3–1.2)
Total Protein: 5 g/dL — ABNORMAL LOW (ref 6.5–8.1)

## 2023-08-07 LAB — CBC
HCT: 27.5 % — ABNORMAL LOW (ref 36.0–46.0)
Hemoglobin: 8.9 g/dL — ABNORMAL LOW (ref 12.0–15.0)
MCH: 32.4 pg (ref 26.0–34.0)
MCHC: 32.4 g/dL (ref 30.0–36.0)
MCV: 100 fL (ref 80.0–100.0)
Platelets: 302 K/uL (ref 150–400)
RBC: 2.75 MIL/uL — ABNORMAL LOW (ref 3.87–5.11)
RDW: 19 % — ABNORMAL HIGH (ref 11.5–15.5)
WBC: 12.3 K/uL — ABNORMAL HIGH (ref 4.0–10.5)
nRBC: 0 % (ref 0.0–0.2)

## 2023-08-07 LAB — GLUCOSE, CAPILLARY
Glucose-Capillary: 118 mg/dL — ABNORMAL HIGH (ref 70–99)
Glucose-Capillary: 89 mg/dL (ref 70–99)
Glucose-Capillary: 104 mg/dL — ABNORMAL HIGH (ref 70–99)
Glucose-Capillary: 86 mg/dL (ref 70–99)
Glucose-Capillary: 85 mg/dL (ref 70–99)

## 2023-08-07 LAB — PROCALCITONIN: Procalcitonin: 2.47 ng/mL

## 2023-08-07 LAB — MAGNESIUM: Magnesium: 1.7 mg/dL (ref 1.7–2.4)

## 2023-08-07 MED ORDER — ENOXAPARIN SODIUM 30 MG/0.3ML IJ SOSY
30.0000 mg | PREFILLED_SYRINGE | INTRAMUSCULAR | Status: DC
  Administered 2023-08-07 – 2023-08-11 (×4): 30 mg via SUBCUTANEOUS
  Filled 2023-08-07 (×4): qty 0.3

## 2023-08-07 MED ORDER — MAGNESIUM SULFATE 4 GM/100ML IV SOLN
4.0000 g | Freq: Once | INTRAVENOUS | Status: AC
  Administered 2023-08-07: 4 g via INTRAVENOUS
  Filled 2023-08-07: qty 100

## 2023-08-07 MED ORDER — POTASSIUM CHLORIDE 10 MEQ/100ML IV SOLN
10.0000 meq | INTRAVENOUS | Status: AC
Start: 2023-08-07 — End: 2023-08-07
  Administered 2023-08-07 (×4): 10 meq via INTRAVENOUS
  Filled 2023-08-07 (×4): qty 100

## 2023-08-07 MED ORDER — ACETAMINOPHEN 325 MG PO TABS
650.0000 mg | ORAL_TABLET | Freq: Four times a day (QID) | ORAL | Status: DC | PRN
  Administered 2023-08-07 – 2023-08-11 (×3): 650 mg via ORAL
  Filled 2023-08-07 (×3): qty 2

## 2023-08-07 MED ORDER — LACTATED RINGERS IV BOLUS
500.0000 mL | Freq: Once | INTRAVENOUS | Status: AC
  Administered 2023-08-07: 500 mL via INTRAVENOUS

## 2023-08-07 NOTE — Plan of Care (Signed)

## 2023-08-07 NOTE — Progress Notes (Addendum)
PROGRESS NOTE    Carol Rowe  ZDG:[id number] DOB: July 12, 1956 DOA: 07/27/2023 PCP: Benita Stabile, MD  Brief Narrative: Carol Rowe is a 67 y.o. female with a history of primary hypertension, COPD, alcohol use disorder, hyperlipidemia-who presented with septic shock secondary to PNA-initially admitted at [facility]-transferred to [facility] ICU for intubation/vasopressor support.  Stabilized with antibiotics-subsequently extubated-further hospital course complicated by development of AKI requiring CRRT, encephalopathy, ileus-slowly improving with supportive care.  See below for further details.    Significant studies: 10/18>> CT head: No acute intracranial process 10/18>> CT C-spine: No fracture 10/19>> echo: EF 50-55% 10/23>> CT abdomen/pelvis: Possible ileus/partial SBO 10/26>> CXR: Patchy bibasilar consolidation.  Microbiology 10/18>> blood culture: No growth 10/20>> tracheal aspirate: Candida albicans (colonization) 10/25>> blood culture: No growth 10/25>> GI pathogen panel: Not detected 10/25>> C. difficile PCR: No growth  Consultants:  P[facility]  Procedures:  CRRT ETT  Physical exam Gen Exam:Alert awake-not in any distress HEENT:atraumatic, normocephalic Chest: B/L clear to auscultation anteriorly CVS:S1S2 regular Abdomen:soft non tender, non distended Extremities:no edema Neurology: Non focal Skin: no rash  Assessment and Plan: Acute hypoxic respiratory failure (extubated 10/22) Septic shock secondary to PNA Sepsis physiology has resolved Required pressors-which were weaned off on 10/22 Completed a course of antibiotics with Zosyn  Fever 10/25-likely secondary to aspiration pneumonia  Fever curve better On Augmentin Culture data as above  Oliguric AKI Secondary to ischemic ATN from septic shock Briefly required CRRT while in the ICU Renal function continues to slowly improve Follow electrolytes Avoid nephrotoxic agents  Acute  toxic metabolic encephalopathy Felt to be a combination of acute illness/AKI/sedation Significantly better-completely awake and alert this morning  GI bleeding with acute blood loss anemia Etiology thought to be ischemic colitis/infectious colitis Stool studies negative GI bleeding has resolved-brown stools In rectal tube. GI recommending outpatient endoscopy evaluation  Ileus Improving-significant stooling seen in rectal tube No vomiting Abdominal exam benign-without any distention Advance to full liquids-with plans to slowly advance to dysphagia 2 diet with thin liquids NG tube was accidentally pulled out on 10/28  Multifactorial anemia Combination of anemia due to acute illness/acute blood loss anemia due to GI bleeding Required 1 unit of PRBC on 10/24-Hb stable since then Follow CBC periodically  Thrombocytopenia, resolved Due to sepsis-resolved CBC periodically likely reactive and secondary to sepsis. Platelets have recovered. Resolved.  Hyponatremia Resolved with IV fluids/volume management with CRRT  Hypokalemia Secondary to GI loss-loose stools overnight Stop lactulose Replete/recheck potassium with tomorrow lab.  Non anion gap metabolic acidosis Secondary to diarrhea Stopping lactulose Supportive care Follow electrolytes  Possible cirrhosis Seen incidentally on CT abdomen No clinical stigmata Stable for further possible workup in the outpatient setting when she is more stable/recovered from acute illness.   Possible cirrhosis Seen incidentally on CT abdomen No clinical stigmata Stable for further possible workup in the outpatient setting when she is more stable/recovered from acute illness.   Left thyroid nodule 2.9 cm Seen incidentally on CT C-spine Stable for outpatient follow-up  Debility/deconditioning PT/OT eval  Nutrition Status: Nutrition Problem: Severe Malnutrition Etiology: chronic illness Signs/Symptoms: severe fat depletion, severe  muscle depletion Interventions: Tube feeding   DVT prophylaxis: Lovenox  Code Status:   Code Status: Full Code  Family Communication: Son-[phone]-updated over the phone 10/29  Data Reviewed: I have personally reviewed following labs and imaging studies  CBC Lab Results  Component Value Date   WBC 12.3 (H) 08/07/2023   RBC 2.75 (L) 08/07/2023   HGB  PROGRESS NOTE    Carol Rowe  ZDG:[id number] DOB: July 12, 1956 DOA: 07/27/2023 PCP: Benita Stabile, MD  Brief Narrative: Carol Rowe is a 67 y.o. female with a history of primary hypertension, COPD, alcohol use disorder, hyperlipidemia-who presented with septic shock secondary to PNA-initially admitted at [facility]-transferred to [facility] ICU for intubation/vasopressor support.  Stabilized with antibiotics-subsequently extubated-further hospital course complicated by development of AKI requiring CRRT, encephalopathy, ileus-slowly improving with supportive care.  See below for further details.    Significant studies: 10/18>> CT head: No acute intracranial process 10/18>> CT C-spine: No fracture 10/19>> echo: EF 50-55% 10/23>> CT abdomen/pelvis: Possible ileus/partial SBO 10/26>> CXR: Patchy bibasilar consolidation.  Microbiology 10/18>> blood culture: No growth 10/20>> tracheal aspirate: Candida albicans (colonization) 10/25>> blood culture: No growth 10/25>> GI pathogen panel: Not detected 10/25>> C. difficile PCR: No growth  Consultants:  [facility]  Procedures:  CRRT ETT  Physical exam Gen Exam:Alert awake-not in any distress HEENT:atraumatic, normocephalic Chest: B/L clear to auscultation anteriorly CVS:S1S2 regular Abdomen:soft non tender, non distended Extremities:no edema Neurology: Non focal Skin: no rash  Assessment and Plan: Acute hypoxic respiratory failure (extubated 10/22) Septic shock secondary to PNA Sepsis physiology has resolved Required pressors-which were weaned off on 10/22 Completed a course of antibiotics with Zosyn  Fever 10/25-likely secondary to aspiration pneumonia  Fever curve better On Augmentin Culture data as above  Oliguric AKI Secondary to ischemic ATN from septic shock Briefly required CRRT while in the ICU Renal function continues to slowly improve Follow electrolytes Avoid nephrotoxic agents  Acute  toxic metabolic encephalopathy Felt to be a combination of acute illness/AKI/sedation Significantly better-completely awake and alert this morning  GI bleeding with acute blood loss anemia Etiology thought to be ischemic colitis/infectious colitis Stool studies negative GI bleeding has resolved-brown stools In rectal tube. GI recommending outpatient endoscopy evaluation  Ileus Improving-significant stooling seen in rectal tube No vomiting Abdominal exam benign-without any distention Advance to full liquids-with plans to slowly advance to dysphagia 2 diet with thin liquids NG tube was accidentally pulled out on 10/28  Multifactorial anemia Combination of anemia due to acute illness/acute blood loss anemia due to GI bleeding Required 1 unit of PRBC on 10/24-Hb stable since then Follow CBC periodically  Thrombocytopenia, resolved Due to sepsis-resolved CBC periodically likely reactive and secondary to sepsis. Platelets have recovered. Resolved.  Hyponatremia Resolved with IV fluids/volume management with CRRT  Hypokalemia Secondary to GI loss-loose stools overnight Stop lactulose Replete/recheck potassium with tomorrow lab.  Non anion gap metabolic acidosis Secondary to diarrhea Stopping lactulose Supportive care Follow electrolytes  Possible cirrhosis Seen incidentally on CT abdomen No clinical stigmata Stable for further possible workup in the outpatient setting when she is more stable/recovered from acute illness.   Possible cirrhosis Seen incidentally on CT abdomen No clinical stigmata Stable for further possible workup in the outpatient setting when she is more stable/recovered from acute illness.   Left thyroid nodule 2.9 cm Seen incidentally on CT C-spine Stable for outpatient follow-up  Debility/deconditioning PT/OT eval  Nutrition Status: Nutrition Problem: Severe Malnutrition Etiology: chronic illness Signs/Symptoms: severe fat depletion, severe  muscle depletion Interventions: Tube feeding   DVT prophylaxis: Lovenox  Code Status:   Code Status: Full Code  Family Communication: Son-[phone]-updated over the phone 10/29  Data Reviewed: I have personally reviewed following labs and imaging studies  CBC Lab Results  Component Value Date   WBC 12.3 (H) 08/07/2023   RBC 2.75 (L) 08/07/2023   HGB  [facility], [address], [place], [place] [address]  Culture, blood (Routine X 2) w Reflex to ID Panel     Status: None (Preliminary result)   Collection Time: 08/03/23  7:27 PM   Specimen: BLOOD  Result Value Ref Range Status   Specimen Description BLOOD SITE NOT SPECIFIED  Final   Special Requests   Final    BOTTLES DRAWN AEROBIC AND ANAEROBIC Blood  Culture adequate volume   Culture   Final    NO GROWTH 3 DAYS Performed at [facility], [address], [place], [place] [address]    Report Status PENDING  Incomplete  Culture, blood (Routine X 2) w Reflex to ID Panel     Status: None (Preliminary result)   Collection Time: 08/03/23  7:27 PM   Specimen: BLOOD  Result Value Ref Range Status   Specimen Description BLOOD SITE NOT SPECIFIED  Final   Special Requests   Final    BOTTLES DRAWN AEROBIC AND ANAEROBIC Blood Culture results may not be optimal due to an excessive volume of blood received in culture bottles   Culture   Final    NO GROWTH 3 DAYS Performed at [facility], [address], [place], [place] [address]    Report Status PENDING  Incomplete      Radiology Studies: DG Swallowing Func-Speech Pathology  Result Date: 08/06/2023 Table formatting from the original result was not included. Modified Barium Swallow Study Patient Details Name: FATOUMATTA HARDINGER MRN: [id number] Date of Birth: [birth year] Today's Date: 08/06/2023 HPI/PMH: HPI: 67 yo female presents to ED on 10/18 found down s/p fall at home with hypoglycemia and hypotension. + head trauma but CTH negative for acute findings. Workup for shock. Pt admitted to ICU 10/19 for pressor support, intubation, ETT 10/19-10/22, CRRT 10/19-10/22. EEG 10/23 suggestive of moderate diffuse encephalopathy. Pt also with mild rhabdo. Hospitalization further complicated by encephalopathy and ileus. PMH includes ETOH abuse, cirrhosis, HTN, COPD, smoker, gout. Clinical Impression: Clinical Impression: Pt has a cognitively based oral dysphagia wtih relatively functional pharyngeal function. She has oral holding that fluctuates in duration, but does often require cues from SLP to swallow. When she initiates posterior transit, her lingual motion is swift. There is oral residue across consistencies that does spill into her pharynx after the swallow, but there is no additional residue  originating in the pharynx as her pharyngeal strength is good. She has intermittent, trace penetration with thin and nectar thick liquids (PAS 2) due to impaired timing, but her laryngeal vestibule closure is effective enough to clear penetrates as she swallows. No aspiration is observed. Discussed with RN, who was present for the study: suspect that her mentation may be her biggest limiting factor when it comes to oral nutrition. Recomemnd starting with Dys 2 (minced) diet and thin liquids to facilitate oral preparation. If nursing finds that this is still too effortful and prolonged at meal times, could downgrade to purees pending SLP f/u. Per MD, given concern for ileus this admission, will start with CLD for today (thin liquids) and advance to Dys 2 within the next day or so if tolerating. Factors that may increase risk of adverse event in presence of aspiration [name] & Clearance Coots 2021): Factors that may increase risk of adverse event in presence of aspiration [name] & Clearance Coots 2021): Reduced cognitive function; Limited mobility Recommendations/Plan: Swallowing Evaluation Recommendations Swallowing Evaluation Recommendations Recommendations: PO diet PO Diet Recommendation: Dysphagia 2 (Finely chopped); Thin liquids (Level 0) Liquid  [facility], [address], [place], [place] [address]  Culture, blood (Routine X 2) w Reflex to ID Panel     Status: None (Preliminary result)   Collection Time: 08/03/23  7:27 PM   Specimen: BLOOD  Result Value Ref Range Status   Specimen Description BLOOD SITE NOT SPECIFIED  Final   Special Requests   Final    BOTTLES DRAWN AEROBIC AND ANAEROBIC Blood  Culture adequate volume   Culture   Final    NO GROWTH 3 DAYS Performed at [facility], [address], [place], [place] [address]    Report Status PENDING  Incomplete  Culture, blood (Routine X 2) w Reflex to ID Panel     Status: None (Preliminary result)   Collection Time: 08/03/23  7:27 PM   Specimen: BLOOD  Result Value Ref Range Status   Specimen Description BLOOD SITE NOT SPECIFIED  Final   Special Requests   Final    BOTTLES DRAWN AEROBIC AND ANAEROBIC Blood Culture results may not be optimal due to an excessive volume of blood received in culture bottles   Culture   Final    NO GROWTH 3 DAYS Performed at [facility], [address], [place], [place] [address]    Report Status PENDING  Incomplete      Radiology Studies: DG Swallowing Func-Speech Pathology  Result Date: 08/06/2023 Table formatting from the original result was not included. Modified Barium Swallow Study Patient Details Name: FATOUMATTA HARDINGER MRN: [id number] Date of Birth: [birth year] Today's Date: 08/06/2023 HPI/PMH: HPI: 67 yo female presents to ED on 10/18 found down s/p fall at home with hypoglycemia and hypotension. + head trauma but CTH negative for acute findings. Workup for shock. Pt admitted to ICU 10/19 for pressor support, intubation, ETT 10/19-10/22, CRRT 10/19-10/22. EEG 10/23 suggestive of moderate diffuse encephalopathy. Pt also with mild rhabdo. Hospitalization further complicated by encephalopathy and ileus. PMH includes ETOH abuse, cirrhosis, HTN, COPD, smoker, gout. Clinical Impression: Clinical Impression: Pt has a cognitively based oral dysphagia wtih relatively functional pharyngeal function. She has oral holding that fluctuates in duration, but does often require cues from SLP to swallow. When she initiates posterior transit, her lingual motion is swift. There is oral residue across consistencies that does spill into her pharynx after the swallow, but there is no additional residue  originating in the pharynx as her pharyngeal strength is good. She has intermittent, trace penetration with thin and nectar thick liquids (PAS 2) due to impaired timing, but her laryngeal vestibule closure is effective enough to clear penetrates as she swallows. No aspiration is observed. Discussed with RN, who was present for the study: suspect that her mentation may be her biggest limiting factor when it comes to oral nutrition. Recomemnd starting with Dys 2 (minced) diet and thin liquids to facilitate oral preparation. If nursing finds that this is still too effortful and prolonged at meal times, could downgrade to purees pending SLP f/u. Per MD, given concern for ileus this admission, will start with CLD for today (thin liquids) and advance to Dys 2 within the next day or so if tolerating. Factors that may increase risk of adverse event in presence of aspiration [name] & Clearance Coots 2021): Factors that may increase risk of adverse event in presence of aspiration [name] & Clearance Coots 2021): Reduced cognitive function; Limited mobility Recommendations/Plan: Swallowing Evaluation Recommendations Swallowing Evaluation Recommendations Recommendations: PO diet PO Diet Recommendation: Dysphagia 2 (Finely chopped); Thin liquids (Level 0) Liquid  PROGRESS NOTE    Carol Rowe  ZDG:[id number] DOB: July 12, 1956 DOA: 07/27/2023 PCP: Benita Stabile, MD  Brief Narrative: Carol Rowe is a 67 y.o. female with a history of primary hypertension, COPD, alcohol use disorder, hyperlipidemia-who presented with septic shock secondary to PNA-initially admitted at [facility]-transferred to [facility] ICU for intubation/vasopressor support.  Stabilized with antibiotics-subsequently extubated-further hospital course complicated by development of AKI requiring CRRT, encephalopathy, ileus-slowly improving with supportive care.  See below for further details.    Significant studies: 10/18>> CT head: No acute intracranial process 10/18>> CT C-spine: No fracture 10/19>> echo: EF 50-55% 10/23>> CT abdomen/pelvis: Possible ileus/partial SBO 10/26>> CXR: Patchy bibasilar consolidation.  Microbiology 10/18>> blood culture: No growth 10/20>> tracheal aspirate: Candida albicans (colonization) 10/25>> blood culture: No growth 10/25>> GI pathogen panel: Not detected 10/25>> C. difficile PCR: No growth  Consultants:  PCCM [facility]  Procedures:  CRRT ETT  Physical exam Gen Exam:Alert awake-not in any distress HEENT:atraumatic, normocephalic Chest: B/L clear to auscultation anteriorly CVS:S1S2 regular Abdomen:soft non tender, non distended Extremities:no edema Neurology: Non focal Skin: no rash  Assessment and Plan: Acute hypoxic respiratory failure (extubated 10/22) Septic shock secondary to PNA Sepsis physiology has resolved Required pressors-which were weaned off on 10/22 Completed a course of antibiotics with Zosyn  Fever 10/25-likely secondary to aspiration pneumonia  Fever curve better On Augmentin Culture data as above  Oliguric AKI Secondary to ischemic ATN from septic shock Briefly required CRRT while in the ICU Renal function continues to slowly improve Follow electrolytes Avoid nephrotoxic agents  Acute  toxic metabolic encephalopathy Felt to be a combination of acute illness/AKI/sedation Significantly better-completely awake and alert this morning  GI bleeding with acute blood loss anemia Etiology thought to be ischemic colitis/infectious colitis Stool studies negative GI bleeding has resolved-brown stools In rectal tube. GI recommending outpatient endoscopy evaluation  Ileus Improving-significant stooling seen in rectal tube No vomiting Abdominal exam benign-without any distention Advance to full liquids-with plans to slowly advance to dysphagia 2 diet with thin liquids NG tube was accidentally pulled out on 10/28  Multifactorial anemia Combination of anemia due to acute illness/acute blood loss anemia due to GI bleeding Required 1 unit of PRBC on 10/24-Hb stable since then Follow CBC periodically  Thrombocytopenia, resolved Due to sepsis-resolved CBC periodically likely reactive and secondary to sepsis. Platelets have recovered. Resolved.  Hyponatremia Resolved with IV fluids/volume management with CRRT  Hypokalemia Secondary to GI loss-loose stools overnight Stop lactulose Replete/recheck potassium with tomorrow lab.  Non anion gap metabolic acidosis Secondary to diarrhea Stopping lactulose Supportive care Follow electrolytes  Possible cirrhosis Seen incidentally on CT abdomen No clinical stigmata Stable for further possible workup in the outpatient setting when she is more stable/recovered from acute illness.   Possible cirrhosis Seen incidentally on CT abdomen No clinical stigmata Stable for further possible workup in the outpatient setting when she is more stable/recovered from acute illness.   Left thyroid nodule 2.9 cm Seen incidentally on CT C-spine Stable for outpatient follow-up  Debility/deconditioning PT/OT eval  Nutrition Status: Nutrition Problem: Severe Malnutrition Etiology: chronic illness Signs/Symptoms: severe fat depletion, severe  muscle depletion Interventions: Tube feeding   DVT prophylaxis: Lovenox  Code Status:   Code Status: Full Code  Family Communication: Son-[phone]-updated over the phone 10/29  Data Reviewed: I have personally reviewed following labs and imaging studies  CBC Lab Results  Component Value Date   WBC 12.3 (H) 08/07/2023   RBC 2.75 (L) 08/07/2023   HGB

## 2023-08-07 NOTE — Significant Event (Signed)
Called by RN:  Tm of 100.2 currently. BP soft 94/50 Tachycardic to 115  Adding procalcitonin to AM lab Adding CMP to AM lab Pt remains on augmentin Will DC aldactone given poor PO intake recently per RN And order a 500cc LR bolus to see if vitals respond Tylenol for temp elevation

## 2023-08-07 NOTE — TOC Progression Note (Signed)
Transition of Care [facility]) - Progression Note    Patient Details  Name: Carol Rowe MRN: [id number] Date of Birth: 1956-02-11  [facility]) CM/SW Contact  Mearl Latin, LCSW Phone Number: 08/07/2023, 4:26 PM  Clinical Narrative:    CSW presented SNF bed offers and ratings to patient. She was unsure which to pick but wanted [facility]. CSW called patient's sister on speakerphone and they selected [facility]. CSW will begin insurance process.    Expected Discharge Plan: Skilled Nursing Facility Barriers to Discharge: Continued Medical Work up, English as a second language teacher  Expected Discharge Plan and Services In-house Referral: Clinical Social Work Discharge Planning Services: [employer] Consult Post Acute Care Choice: Skilled Nursing Facility Living arrangements for the past 2 months: Single Family Home                                       Social Determinants of Health (SDOH) Interventions SDOH Screenings   Tobacco Use: High Risk (07/27/2023)    Readmission Risk Interventions    08/07/2023    4:25 PM  Readmission Risk Prevention Plan  Transportation Screening Complete  Medication Review (RN Care Manager) Complete  PCP or Specialist appointment within 3-5 days of discharge Complete  HRI or Home Care Consult Complete  SW Recovery Care/Counseling Consult Complete  Palliative Care Screening Not Applicable  Skilled Nursing Facility Complete

## 2023-08-07 NOTE — Care Management Important Message (Signed)
Important Message  Patient Details  Name: Carol Rowe MRN: [id number] Date of Birth: 04-28-1956   Important Message Given:  Yes - Medicare IM     Dorena Bodo 08/07/2023, 2:24 PM

## 2023-08-07 NOTE — Care Management Important Message (Signed)
Important Message  Patient Details  Name: LEMPI FALSETTI MRN: [id number] Date of Birth: 05/24/1956   Important Message Given:  Yes - Medicare IM     Dorena Bodo 08/07/2023, 2:23 PM

## 2023-08-07 NOTE — Progress Notes (Signed)
Speech Language Pathology Treatment: Dysphagia  Patient Details Name: Carol Rowe MRN: [id number] DOB: [birth year] Today's Date: 08/07/2023 Time: 4098-1191 SLP Time Calculation (min) (ACUTE ONLY): 10 min  Assessment / Plan / Recommendation Clinical Impression  Pt believes that she is swallowing well, with her main complaint being that she's only getting liquids. Reinforced recommendations from MBS including potential to advance to some minced solids once medically cleared to do so. She consumed thin liquids including jello on her tray without overt signs of dysphagia. Pt did need assistance cognitively to initiate and sustain attention to PO intake, as well as to remember what was on her tray. Recommend advancing to Dys 2 diet and thin liquids once medically cleared by MD to have solids. Pt will likely benefit from supervision during meals to try to facilitate intake.    HPI HPI: 67 yo female presents to ED on 10/18 found down s/p fall at home with hypoglycemia and hypotension. + head trauma but CTH negative for acute findings. Workup for shock. Pt admitted to ICU 10/19 for pressor support, intubation, ETT 10/19-10/22, CRRT 10/19-10/22. EEG 10/23 suggestive of moderate diffuse encephalopathy. Pt also with mild rhabdo. Hospitalization further complicated by encephalopathy and ileus. PMH includes ETOH abuse, cirrhosis, HTN, COPD, smoker, gout.      SLP Plan  Continue with current plan of care      Recommendations for follow up therapy are one component of a multi-disciplinary discharge planning process, led by the attending physician.  Recommendations may be updated based on patient status, additional functional criteria and insurance authorization.    Recommendations  Diet recommendations: Dysphagia 2 (fine chop);Thin liquid (once medically cleared by MD for solids) Liquids provided via: Cup;Straw Medication Administration: Whole meds with liquid (can consider purees PRN if oral holdingi s  observed clinically) Supervision: Patient able to self feed;Full supervision/cueing for compensatory strategies Compensations: Minimize environmental distractions;Slow rate;Small sips/bites Postural Changes and/or Swallow Maneuvers: Seated upright 90 degrees                  Oral care BID   Frequent or constant Supervision/Assistance Dysphagia, oral phase (R13.11)     Continue with current plan of care     Mahala Menghini., M.A. CCC-SLP Acute Rehabilitation Services Office [phone]  Secure chat preferred   08/07/2023, 1:35 PM

## 2023-08-08 DIAGNOSIS — R579 Shock, unspecified: Secondary | ICD-10-CM | POA: Diagnosis not present

## 2023-08-08 DIAGNOSIS — G9340 Encephalopathy, unspecified: Secondary | ICD-10-CM | POA: Diagnosis not present

## 2023-08-08 DIAGNOSIS — E871 Hypo-osmolality and hyponatremia: Secondary | ICD-10-CM | POA: Diagnosis not present

## 2023-08-08 DIAGNOSIS — E876 Hypokalemia: Secondary | ICD-10-CM | POA: Diagnosis not present

## 2023-08-08 LAB — RENAL FUNCTION PANEL
Albumin: <1.5 g/dL — ABNORMAL LOW (ref 3.5–5.0)
Anion gap: 11 (ref 5–15)
BUN: 18 mg/dL (ref 8–23)
CO2: 14 mmol/L — ABNORMAL LOW (ref 22–32)
Calcium: 8 mg/dL — ABNORMAL LOW (ref 8.9–10.3)
Chloride: 113 mmol/L — ABNORMAL HIGH (ref 98–111)
Creatinine, Ser: 1.71 mg/dL — ABNORMAL HIGH (ref 0.44–1.00)
GFR, Estimated: 33 mL/min — ABNORMAL LOW (ref >60)
Glucose, Bld: 89 mg/dL (ref 70–99)
Phosphorus: 3.8 mg/dL (ref 2.5–4.6)
Potassium: 3.1 mmol/L — ABNORMAL LOW (ref 3.5–5.1)
Sodium: 138 mmol/L (ref 135–145)

## 2023-08-08 LAB — CULTURE, BLOOD (ROUTINE X 2)
Culture: NO GROWTH
Culture: NO GROWTH
Special Requests: ADEQUATE

## 2023-08-08 LAB — GLUCOSE, CAPILLARY
Glucose-Capillary: 222 mg/dL — ABNORMAL HIGH (ref 70–99)
Glucose-Capillary: 48 mg/dL — ABNORMAL LOW (ref 70–99)
Glucose-Capillary: 78 mg/dL (ref 70–99)
Glucose-Capillary: 80 mg/dL (ref 70–99)
Glucose-Capillary: 82 mg/dL (ref 70–99)
Glucose-Capillary: 85 mg/dL (ref 70–99)
Glucose-Capillary: 91 mg/dL (ref 70–99)
Glucose-Capillary: 98 mg/dL (ref 70–99)

## 2023-08-08 LAB — MAGNESIUM: Magnesium: 2.1 mg/dL (ref 1.7–2.4)

## 2023-08-08 MED ORDER — METOCLOPRAMIDE HCL 10 MG PO TABS: 5.0000 mg | ORAL_TABLET | Freq: Three times a day (TID) | ORAL | Status: DC | PRN

## 2023-08-08 MED ORDER — POTASSIUM CHLORIDE CRYS ER 20 MEQ PO TBCR
40.0000 meq | EXTENDED_RELEASE_TABLET | ORAL | Status: AC
  Administered 2023-08-08 (×2): 40 meq via ORAL
  Filled 2023-08-08 (×2): qty 2

## 2023-08-08 MED ORDER — DEXTROSE 50 % IV SOLN: 25.0000 g | INTRAVENOUS | Status: AC

## 2023-08-08 MED ORDER — SODIUM BICARBONATE 650 MG PO TABS
650.0000 mg | ORAL_TABLET | Freq: Two times a day (BID) | ORAL | Status: DC
  Administered 2023-08-08 – 2023-08-11 (×7): 650 mg via ORAL
  Filled 2023-08-08 (×7): qty 1

## 2023-08-08 MED ORDER — DEXTROSE 50 % IV SOLN
INTRAVENOUS | Status: AC
  Administered 2023-08-08: 25 g via INTRAVENOUS
  Filled 2023-08-08: qty 50

## 2023-08-08 MED ORDER — CALCIUM CARBONATE ANTACID 500 MG PO CHEW
1.0000 | CHEWABLE_TABLET | Freq: Two times a day (BID) | ORAL | Status: DC
  Administered 2023-08-09 – 2023-08-11 (×5): 200 mg via ORAL
  Filled 2023-08-08 (×5): qty 1

## 2023-08-08 NOTE — Plan of Care (Signed)

## 2023-08-08 NOTE — Progress Notes (Signed)
Occupational Therapy Treatment Patient Details Name: Carol Rowe MRN: [id number] DOB: Mar 29, 1956 Today's Date: 08/08/2023   History of present illness 67 yo female presents to ED on 10/18 found down s/p fall at home with hypoglycemia and hypotension. + head trauma but CTH negative for acute findings. Workup for shock. Pt admitted to ICU 10/19 for pressor support, intubation, ETT 10/19-10/22, CRRT 10/19-10/22. EEG 10/23 suggestive of moderate diffuse encephalopathy. Pt also with mild rhabdo. PMH includes ETOH abuse, cirrhosis, HTN, COPD, smoker, gout.   OT comments  Pt continuing to progress in therapy sessions, cognition seems to be improving and she is more appropriate and redirectable. Pt able to ambulate to bathroom today but needs significant assist using RW. She does attempt ADL participation but is not thorough and visual looks very fatigued during activity performance. OT to continue to progress pt as able, DC plans remain appropriate for SNF.      If plan is discharge home, recommend the following:  Two people to help with walking and/or transfers;Two people to help with bathing/dressing/bathroom;Assistance with cooking/housework;Assist for transportation;Help with stairs or ramp for entrance;Direct supervision/assist for financial management;Direct supervision/assist for medications management;Assistance with feeding   Equipment Recommendations  Other (comment) (defer)    Recommendations for Other Services      Precautions / Restrictions Precautions Precautions: Fall Restrictions Weight Bearing Restrictions: No       Mobility Bed Mobility Overal bed mobility: Needs Assistance Bed Mobility: Supine to Sit, Sit to Supine     Supine to sit: Min assist, HOB elevated Sit to supine: Min assist   General bed mobility comments: Min A for trunk with supine to sitting and LE for sitting to supine with extra time for movement.    Transfers Overall transfer level: Needs  assistance Equipment used: Rolling walker (2 wheels) Transfers: Sit to/from Stand, Bed to chair/wheelchair/BSC Sit to Stand: Mod assist, Max assist     Step pivot transfers: Mod assist, +2 safety/equipment, +2 physical assistance     General transfer comment: Pt requires Mod A from EOB and Max A from toilet for sit to stand with cueing for safe hand placement. Pt requires Mod A +2 for transfer from EOB to toilet with balance, wgt shifting, LE progression and navigation of AD with multi modal cueing for body habitus distance from AD to prevent anterior fall. LLE demonstrating mild buckle that pt is able to catch and RLE with toe drag, hip hiking to progress and intermittently not fully stepping to the LLE for alignment and balance.     Balance Overall balance assessment: Needs assistance Sitting-balance support: Feet supported, Bilateral upper extremity supported   Sitting balance - Comments: pt able to sit EOB and on the toilet without LOB   Standing balance support: Bilateral upper extremity supported, During functional activity, Reliant on assistive device for balance Standing balance-Leahy Scale: Poor Standing balance comment: Pt requires external support especially during dynamic activities.                           ADL either performed or assessed with clinical judgement   ADL Overall ADL's : Needs assistance/impaired     Grooming: Wash/dry hands;Sitting;Contact guard assist               Lower Body Dressing: Sit to/from stand;Maximal assistance Lower Body Dressing Details (indicate cue type and reason): doff/don briefs Toilet Transfer: Moderate assistance;Rolling walker (2 wheels);+2 for physical assistance;+2 for safety/equipment;Ambulation   Toileting- Clothing  Manipulation and Hygiene: Moderate assistance;Sit to/from stand Toileting - Clothing Manipulation Details (indicate cue type and reason): Pt attempting self wiping with CGA while seated but not  having much success, additional assist needed for self cleanup in standing.     Functional mobility during ADLs: Rolling walker (2 wheels);+2 for physical assistance;Moderate assistance      Extremity/Trunk Assessment              Vision       Perception     Praxis      Cognition Arousal: Alert Behavior During Therapy: WFL for tasks assessed/performed Overall Cognitive Status: No family/caregiver present to determine baseline cognitive functioning Area of Impairment: Following commands, Safety/judgement, Awareness, Problem solving, Attention                   Current Attention Level: Focused   Following Commands: Follows one step commands with increased time, Follows one step commands consistently, Follows multi-step commands inconsistently, Follows multi-step commands with increased time Safety/Judgement: Decreased awareness of safety, Decreased awareness of deficits   Problem Solving: Slow processing, Requires verbal cues, Requires tactile cues          Exercises      Shoulder Instructions       General Comments HR up to 136 bpm max with activity    Pertinent Vitals/ Pain       Pain Assessment Pain Assessment: No/denies pain  Home Living Family/patient expects to be discharged to:: Private residence Living Arrangements: Children (son) Available Help at Discharge: Available 24 hours/day;Family Type of Home: House Home Access: Stairs to enter [employer] of Steps: 6-7 stairs then 3-4 steps in the front Entrance Stairs-Rails: Right;Left;Can reach both Home Layout: One level     Bathroom Shower/Tub: Chief Strategy Officer: Handicapped height     Home Equipment: Grab bars - tub/shower;Grab bars - toilet;Cane - single point          Prior Functioning/Environment              Frequency  Min 1X/week        Progress Toward Goals  OT Goals(current goals can now be found in the care plan section)  Progress  towards OT goals: Progressing toward goals  Acute Rehab OT Goals Patient Stated Goal: none stated OT Goal Formulation: With patient Time For Goal Achievement: 08/16/23 Potential to Achieve Goals: Good ADL Goals Additional ADL Goal #2: Pt will appropriately sequence grooming and bathing ADLs 100% of the time in sitting without any cueing  Plan      Co-evaluation    PT/OT/SLP Co-Evaluation/Treatment: Yes Reason for Co-Treatment: For patient/therapist safety;To address functional/ADL transfers;Complexity of the patient's impairments (multi-system involvement) PT goals addressed during session: Mobility/safety with mobility;Balance;Proper use of DME OT goals addressed during session: ADL's and self-care;Proper use of Adaptive equipment and DME      AM-PAC OT "6 Clicks" Daily Activity     Outcome Measure   Help from another person eating meals?: None (pt reports being ind with eating breakfast) Help from another person taking care of personal grooming?: A Little Help from another person toileting, which includes using toliet, bedpan, or urinal?: A Lot Help from another person bathing (including washing, rinsing, drying)?: A Lot Help from another person to put on and taking off regular upper body clothing?: A Lot Help from another person to put on and taking off regular lower body clothing?: A Lot 6 Click Score: 15    End of Session Equipment  Utilized During Treatment: Gait belt;Rolling walker (2 wheels)  OT Visit Diagnosis: Unsteadiness on feet (R26.81);Muscle weakness (generalized) (M62.81);Other symptoms and signs involving cognitive function   Activity Tolerance Patient tolerated treatment well   Patient Left in bed;with call bell/phone within reach;with bed alarm set   Nurse Communication Mobility status        Time: 4098-1191 OT Time Calculation (min): 38 min  Charges: OT General Charges $OT Visit: 1 Visit OT Treatments $Self Care/Home Management : 23-37  mins  08/08/2023  AB, OTR/L  Acute Rehabilitation Services  Office: [phone]   Tristan Schroeder 08/08/2023, 2:02 PM

## 2023-08-08 NOTE — Progress Notes (Signed)
PROGRESS NOTE    Carol Rowe  ZOX:[id number] DOB: December 20, 1955 DOA: 07/27/2023 PCP: Benita Stabile, MD  Brief Narrative: Carol Rowe is a 67 y.o. female with a history of primary hypertension, COPD, alcohol use disorder, hyperlipidemia-who presented with septic shock secondary to PNA-initially admitted at [facility]-transferred to [facility] ICU for intubation/vasopressor support.  Stabilized with antibiotics-subsequently extubated-further hospital course complicated by development of AKI requiring CRRT, encephalopathy, ileus-slowly improving with supportive care.  See below for further details.    Significant studies: 10/18>> CT head: No acute intracranial process 10/18>> CT C-spine: No fracture 10/19>> echo: EF 50-55% 10/23>> CT abdomen/pelvis: Possible ileus/partial SBO 10/26>> CXR: Patchy bibasilar consolidation.  Microbiology 10/18>> blood culture: No growth 10/20>> tracheal aspirate: Candida albicans (colonization) 10/25>> blood culture: No growth 10/25>> GI pathogen panel: Not detected 10/25>> C. difficile PCR: No growth  Consultants:  [facility]  Procedures:  CRRT ETT  Subjective No complaints-wants diet advanced.  Physical exam Gen Exam:Alert awake-not in any distress HEENT:atraumatic, normocephalic Chest: B/L clear to auscultation anteriorly CVS:S1S2 regular Abdomen:soft non tender, non distended Extremities:no edema Neurology: Non focal Skin: no rash  Assessment and Plan: Acute hypoxic respiratory failure (extubated 10/22) Septic shock secondary to PNA Sepsis physiology has resolved Required pressors-which were weaned off on 10/22 Completed a course of antibiotics with Zosyn  Fever 10/25-likely secondary to aspiration pneumonia  Afebrile overnight Culture data negative so far Augmentin for a few more days.  AKI Secondary to ischemic ATN from septic shock Briefly required CRRT while in the ICU Renal function continues to  slowly improve Follow electrolytes Avoid nephrotoxic agents  Acute toxic metabolic encephalopathy Felt to be a combination of acute illness/AKI/sedation Significantly better-completely awake and alert this morning  GI bleeding with acute blood loss anemia Etiology thought to be ischemic colitis/infectious colitis Stool studies negative GI bleeding has resolved-brown stools In rectal tube. GI recommending outpatient endoscopy evaluation  Ileus Resolved-tolerating diet-having BMs No abdominal pain/vomiting Tolerating full liquids-advance to dysphagia 2 diet  Multifactorial anemia Combination of anemia due to acute illness/acute blood loss anemia due to GI bleeding Required 1 unit of PRBC on 10/24-Hb stable since then Follow CBC periodically  Thrombocytopenia, resolved Due to sepsis-resolved Follow CBC periodically  Hyponatremia Resolved with IV fluids/volume management with CRRT  Hypokalemia Secondary to GI loss-loose stools overnight No longer on lactulose Replete/recheck.  Non anion gap metabolic acidosis Secondary to diarrhea Start oral bicarb supplementation Abdomen soft-follow-up  Possible cirrhosis Seen incidentally on CT abdomen No clinical stigmata Stable for further possible workup in the outpatient setting when she is more stable/recovered from acute illness.   Possible cirrhosis Seen incidentally on CT abdomen No clinical stigmata Stable for further possible workup in the outpatient setting when she is more stable/recovered from acute illness.   Left thyroid nodule 2.9 cm Seen incidentally on CT C-spine Stable for outpatient follow-up  Debility/deconditioning PT/OT eval  Nutrition Status: Nutrition Problem: Severe Malnutrition Etiology: chronic illness Signs/Symptoms: severe fat depletion, severe muscle depletion Interventions: Tube feeding   DVT prophylaxis: Lovenox  Code Status:   Code Status: Full Code  Family Communication:  Son-Will-[phone]-updated over the phone 10/29  Data Reviewed: I have personally reviewed following labs and imaging studies  CBC Lab Results  Component Value Date   WBC 12.3 (H) 08/07/2023   RBC 2.75 (L) 08/07/2023   HGB 8.9 (L) 08/07/2023   HCT 27.5 (L) 08/07/2023   MCV 100.0 08/07/2023   MCH 32.4 08/07/2023   PLT 302 08/07/2023  MCHC 32.4 08/07/2023   RDW 19.0 (H) 08/07/2023   LYMPHSABS 0.5 (L) 07/27/2023   MONOABS 0.3 07/27/2023   EOSABS 0.0 07/27/2023   BASOSABS 0.0 07/27/2023     Last metabolic panel Lab Results  Component Value Date   NA 138 08/08/2023   K 3.1 (L) 08/08/2023   CL 113 (H) 08/08/2023   CO2 14 (L) 08/08/2023   BUN 18 08/08/2023   CREATININE 1.71 (H) 08/08/2023   GLUCOSE 89 08/08/2023   GFRNONAA 33 (L) 08/08/2023   GFRAA >60 09/03/2017   CALCIUM 8.0 (L) 08/08/2023   PHOS 3.8 08/08/2023   PROT 5.0 (L) 08/07/2023   ALBUMIN <1.5 (L) 08/08/2023   BILITOT 1.1 08/07/2023   ALKPHOS 157 (H) 08/07/2023   AST 21 08/07/2023   ALT 28 08/07/2023   ANIONGAP 11 08/08/2023    GFR: Estimated Creatinine Clearance: 25.7 mL/min (A) (by C-G formula based on SCr of 1.71 mg/dL (H)).  Recent Results (from the past 240 hour(s))  Culture, Respiratory w Gram Stain     Status: None   Collection Time: 07/29/23  4:13 PM   Specimen: Tracheal Aspirate; Respiratory  Result Value Ref Range Status   Specimen Description TRACHEAL ASPIRATE  Final   Special Requests NONE  Final   Gram Stain   Final    NO WBC SEEN RARE BUDDING YEAST SEEN Performed at [facility], [address], [place], [place] [address]    Culture FEW CANDIDA ALBICANS  Final   Report Status 07/31/2023 FINAL  Final  Gastrointestinal Panel by PCR , Stool     Status: None   Collection Time: 08/03/23  3:36 PM   Specimen: Stool  Result Value Ref Range Status   Campylobacter species NOT DETECTED NOT DETECTED Final   Plesimonas shigelloides NOT DETECTED NOT DETECTED Final   Salmonella species  NOT DETECTED NOT DETECTED Final   Yersinia enterocolitica NOT DETECTED NOT DETECTED Final   Vibrio species NOT DETECTED NOT DETECTED Final   Vibrio cholerae NOT DETECTED NOT DETECTED Final   Enteroaggregative E coli (EAEC) NOT DETECTED NOT DETECTED Final   Enteropathogenic E coli (EPEC) NOT DETECTED NOT DETECTED Final   Enterotoxigenic E coli (ETEC) NOT DETECTED NOT DETECTED Final   Shiga like toxin producing E coli (STEC) NOT DETECTED NOT DETECTED Final   Shigella/Enteroinvasive E coli (EIEC) NOT DETECTED NOT DETECTED Final   Cryptosporidium NOT DETECTED NOT DETECTED Final   Cyclospora cayetanensis NOT DETECTED NOT DETECTED Final   Entamoeba histolytica NOT DETECTED NOT DETECTED Final   Giardia lamblia NOT DETECTED NOT DETECTED Final   Adenovirus F40/41 NOT DETECTED NOT DETECTED Final   Astrovirus NOT DETECTED NOT DETECTED Final   Norovirus GI/GII NOT DETECTED NOT DETECTED Final   Rotavirus A NOT DETECTED NOT DETECTED Final   Sapovirus (I, II, IV, and V) NOT DETECTED NOT DETECTED Final    Comment: Performed at [facility], [address] [place], [place] [address]  C Difficile Quick Screen (NO PCR Reflex)     Status: None   Collection Time: 08/03/23  3:36 PM   Specimen: STOOL  Result Value Ref Range Status   C Diff antigen NEGATIVE NEGATIVE Final   C Diff toxin NEGATIVE NEGATIVE Final   C Diff interpretation No C. difficile detected.  Final    Comment: Performed at [facility], [address] [place], [place] [address]  Culture, blood (Routine X 2) w Reflex to ID Panel     Status: None  Collection Time: 08/03/23  7:27 PM   Specimen: BLOOD  Result Value Ref Range Status   Specimen Description BLOOD SITE NOT SPECIFIED  Final   Special Requests   Final    BOTTLES DRAWN AEROBIC AND ANAEROBIC Blood Culture adequate volume   Culture   Final    NO GROWTH 5 DAYS Performed at [facility], [address], [place], [place] [address]    Report Status 08/08/2023  FINAL  Final  Culture, blood (Routine X 2) w Reflex to ID Panel     Status: None   Collection Time: 08/03/23  7:27 PM   Specimen: BLOOD  Result Value Ref Range Status   Specimen Description BLOOD SITE NOT SPECIFIED  Final   Special Requests   Final    BOTTLES DRAWN AEROBIC AND ANAEROBIC Blood Culture results may not be optimal due to an excessive volume of blood received in culture bottles   Culture   Final    NO GROWTH 5 DAYS Performed at [facility], [address], [place], [place] [address]    Report Status 08/08/2023 FINAL  Final      Radiology Studies: No results found.    LOS: 12 days    Jeoffrey Massed, MD [facility] 08/08/2023, 10:13 AM   If 7PM-7AM, please contact night-coverage [email or web address]

## 2023-08-08 NOTE — Progress Notes (Signed)
Physical Therapy Treatment Patient Details Name: Carol Rowe MRN: [id number] DOB: 31-Jul-1956 Today's Date: 08/08/2023   History of Present Illness 67 yo female presents to ED on 10/18 found down s/p fall at home with hypoglycemia and hypotension. + head trauma but CTH negative for acute findings. Workup for shock. Pt admitted to ICU 10/19 for pressor support, intubation, ETT 10/19-10/22, CRRT 10/19-10/22. EEG 10/23 suggestive of moderate diffuse encephalopathy. Pt also with mild rhabdo. PMH includes ETOH abuse, cirrhosis, HTN, COPD, smoker, gout.    PT Comments  Pt progressing towards goals. Pt was able to progress gait distance this session for short less than home distances with physical assist for wgt shifting to facilitate forward progression of the RLE. Pt continues at Mod A for bed mobility and sit to stand requiring increased assist from lower surfaces. Due to pt current functional status, home set up and available assistance at home recommending skilled physical therapy services < 3 hours/day on discharge from acute care hospital setting in order to decrease risk for falls, injury, immobility and re-hospitalization. Pt was fatigued at end of session and provided with warm blankets on request.    If plan is discharge home, recommend the following: Two people to help with walking and/or transfers;Assistance with cooking/housework;Assist for transportation;Help with stairs or ramp for entrance   Can travel by private vehicle     No  Equipment Recommendations  Other (comment) (defer to post acute)       Precautions / Restrictions Precautions Precautions: Fall Restrictions Weight Bearing Restrictions: No     Mobility  Bed Mobility Overal bed mobility: Needs Assistance Bed Mobility: Supine to Sit, Sit to Supine     Supine to sit: Min assist, HOB elevated Sit to supine: Min assist   General bed mobility comments: Min A for trunk with supine to sitting and LE for sitting to  supine with extra time for movement.    Transfers Overall transfer level: Needs assistance Equipment used: Rolling walker (2 wheels) Transfers: Sit to/from Stand, Bed to chair/wheelchair/BSC Sit to Stand: Mod assist, Max assist   Step pivot transfers: Mod assist, +2 safety/equipment, +2 physical assistance       General transfer comment: Pt requires Mod A from EOB and Max A from toilet for sit to stand with cueing for safe hand placement. Pt requires Mod A +2 for transfer from EOB to toilet with balance, wgt shifting, LE progression and navigation of AD with multi modal cueing for body habitus distance from AD to prevent anterior fall. LLE demonstrating mild buckle that pt is able to catch and RLE with toe drag, hip hiking to progress and intermittently not fully stepping to the LLE for alignment and balance.    Ambulation/Gait Ambulation/Gait assistance: Mod assist, +2 physical assistance Gait Distance (Feet): 12 Feet (2x) Assistive device: Rolling walker (2 wheels) Gait Pattern/deviations: Step-through pattern Gait velocity: decreased Gait velocity interpretation: <1.31 ft/sec, indicative of household ambulator   General Gait Details: assist wtih balance, wgt shifting, LE progression and navigation of AD with multi modal cueing for body habitus distance from AD to prevent anterior fall.       Balance Overall balance assessment: Needs assistance Sitting-balance support: Feet supported, Bilateral upper extremity supported Sitting balance-Leahy Scale: Fair Sitting balance - Comments: pt able to sit EOB and on the toilet without LOB   Standing balance support: Bilateral upper extremity supported, During functional activity, Reliant on assistive device for balance Standing balance-Leahy Scale: Poor Standing balance comment: Pt requires external  support especially during dynamic activities.        Cognition Arousal: Alert Behavior During Therapy: WFL for tasks  assessed/performed Overall Cognitive Status: No family/caregiver present to determine baseline cognitive functioning Area of Impairment: Following commands, Safety/judgement, Awareness, Problem solving, Attention       Following Commands: Follows one step commands with increased time, Follows one step commands consistently, Follows multi-step commands inconsistently, Follows multi-step commands with increased time Safety/Judgement: Decreased awareness of safety, Decreased awareness of deficits           General Comments General comments (skin integrity, edema, etc.): Pt required some encouragement to participate.      Pertinent Vitals/Pain Pain Assessment Pain Assessment: No/denies pain    Home Living Family/patient expects to be discharged to:: Private residence Living Arrangements: Children (son) Available Help at Discharge: Available 24 hours/day;Family Type of Home: House Home Access: Stairs to enter Entrance Stairs-Rails: Right;Left;Can reach both Entrance Stairs-Number of Steps: 6-7 stairs then 3-4 steps in the front   Home Layout: One level Home Equipment: Grab bars - tub/shower;Grab bars - toilet;Cane - single point          PT Goals (current goals can now be found in the care plan section) Acute Rehab PT Goals PT Goal Formulation: Patient unable to participate in goal setting Time For Goal Achievement: 08/16/23 Potential to Achieve Goals: Good Progress towards PT goals: Progressing toward goals    Frequency    Min 1X/week      PT Plan      Co-evaluation PT/OT/SLP Co-Evaluation/Treatment: Yes Reason for Co-Treatment: For patient/therapist safety;To address functional/ADL transfers;Complexity of the patient's impairments (multi-system involvement) PT goals addressed during session: Mobility/safety with mobility;Balance;Proper use of DME        AM-PAC PT "6 Clicks" Mobility   Outcome Measure  Help needed turning from your back to your side while in  a flat bed without using bedrails?: A Lot Help needed moving from lying on your back to sitting on the side of a flat bed without using bedrails?: A Lot Help needed moving to and from a bed to a chair (including a wheelchair)?: A Lot Help needed standing up from a chair using your arms (e.g., wheelchair or bedside chair)?: A Lot Help needed to walk in hospital room?: Total Help needed climbing 3-5 steps with a railing? : Total 6 Click Score: 10    End of Session Equipment Utilized During Treatment: Gait belt Activity Tolerance: Patient tolerated treatment well;Patient limited by fatigue Patient left: with call bell/phone within reach;in bed;with bed alarm set Nurse Communication: Mobility status PT Visit Diagnosis: Other abnormalities of gait and mobility (R26.89);Muscle weakness (generalized) (M62.81)     Time: 1610-9604 PT Time Calculation (min) (ACUTE ONLY): 38 min  Charges:    $Therapeutic Activity: 8-22 mins PT General Charges $$ ACUTE PT VISIT: 1 Visit                     Harrel Carina, DPT, CLT  Acute Rehabilitation Services Office: [phone] (Secure chat preferred)    Claudia Desanctis 08/08/2023, 1:16 PM

## 2023-08-08 NOTE — TOC Progression Note (Addendum)
Transition of Care [facility]) - Progression Note    Patient Details  Name: Carol Rowe MRN: [id number] Date of Birth: 09-Feb-1956  Transition of Care [facility]) CM/SW Contact  Mearl Latin, LCSW Phone Number: 08/08/2023, 9:25 AM  Clinical Narrative:    CSW unable to initiate insurance process on the portal so requested [facility] begin authorization.  CSW received call from patient's sister stating that patient said palliative care was recommended. CSW let her know that CSW does not see any notes from Palliative care but CSW can update her later. CSW answered questions and sister stated she believes patient's [facility] plan is primary over Medicare. CSW sent info to [facility] to verify.   CSW received return call from patient's son who reported agreement with plan as well.      Expected Discharge Plan: Skilled Nursing Facility Barriers to Discharge: Continued Medical Work up, English as a second language teacher  Expected Discharge Plan and Services In-house Referral: Clinical Social Work Discharge Planning Services: [employer] Consult Post Acute Care Choice: Skilled Nursing Facility Living arrangements for the past 2 months: Single Family Home                                       Social Determinants of Health (SDOH) Interventions SDOH Screenings   Tobacco Use: High Risk (07/27/2023)    Readmission Risk Interventions    08/07/2023    4:25 PM  Readmission Risk Prevention Plan  Transportation Screening Complete  Medication Review (RN Care Manager) Complete  PCP or Specialist appointment within 3-5 days of discharge Complete  HRI or Home Care Consult Complete  SW Recovery Care/Counseling Consult Complete  Palliative Care Screening Not Applicable  Skilled Nursing Facility Complete

## 2023-08-08 NOTE — Inpatient Diabetes Management (Signed)
Inpatient Diabetes Program Recommendations  AACE/ADA: New Consensus Statement on Inpatient Glycemic Control (2015)  Target Ranges:  Prepandial:   less than 140 mg/dL      Peak postprandial:   less than 180 mg/dL (1-2 hours)      Critically ill patients:  140 - 180 mg/dL   Lab Results  Component Value Date   GLUCAP 78 08/08/2023   HGBA1C 5.1 07/30/2023    Review of Glycemic Control  Latest Reference Range & Units 08/08/23 00:39 08/08/23 01:10 08/08/23 04:23 08/08/23 07:57 08/08/23 12:03  Glucose-Capillary 70 - 99 mg/dL 48 (L) 119 (H) 91 85 78  (L): Data is abnormally low  Current orders for Inpatient glycemic control: Novolog 0-15 Q4h  Inpatient Diabetes Program Recommendations:    Noted hypoglycemia of 47 mg/dL Consider reducing correction to Novolog 0-6 units Q4H.   Thanks, Lujean Rave, MSN, RNC-OB Diabetes Coordinator [phone] (8a-5p)

## 2023-08-09 DIAGNOSIS — E876 Hypokalemia: Secondary | ICD-10-CM | POA: Diagnosis not present

## 2023-08-09 DIAGNOSIS — E871 Hypo-osmolality and hyponatremia: Secondary | ICD-10-CM | POA: Diagnosis not present

## 2023-08-09 DIAGNOSIS — R579 Shock, unspecified: Secondary | ICD-10-CM | POA: Diagnosis not present

## 2023-08-09 DIAGNOSIS — G9340 Encephalopathy, unspecified: Secondary | ICD-10-CM | POA: Diagnosis not present

## 2023-08-09 LAB — CBC
HCT: 28.9 % — ABNORMAL LOW (ref 36.0–46.0)
Hemoglobin: 9.4 g/dL — ABNORMAL LOW (ref 12.0–15.0)
MCH: 32.3 pg (ref 26.0–34.0)
MCHC: 32.5 g/dL (ref 30.0–36.0)
MCV: 99.3 fL (ref 80.0–100.0)
Platelets: 336 10*3/uL (ref 150–400)
RBC: 2.91 MIL/uL — ABNORMAL LOW (ref 3.87–5.11)
RDW: 18.4 % — ABNORMAL HIGH (ref 11.5–15.5)
WBC: 18.4 10*3/uL — ABNORMAL HIGH (ref 4.0–10.5)
nRBC: 0 % (ref 0.0–0.2)

## 2023-08-09 LAB — GLUCOSE, CAPILLARY
Glucose-Capillary: 108 mg/dL — ABNORMAL HIGH (ref 70–99)
Glucose-Capillary: 113 mg/dL — ABNORMAL HIGH (ref 70–99)
Glucose-Capillary: 63 mg/dL — ABNORMAL LOW (ref 70–99)
Glucose-Capillary: 82 mg/dL (ref 70–99)
Glucose-Capillary: 87 mg/dL (ref 70–99)
Glucose-Capillary: 97 mg/dL (ref 70–99)

## 2023-08-09 LAB — BASIC METABOLIC PANEL
Anion gap: 8 (ref 5–15)
BUN: 13 mg/dL (ref 8–23)
CO2: 15 mmol/L — ABNORMAL LOW (ref 22–32)
Calcium: 8.3 mg/dL — ABNORMAL LOW (ref 8.9–10.3)
Chloride: 114 mmol/L — ABNORMAL HIGH (ref 98–111)
Creatinine, Ser: 1.63 mg/dL — ABNORMAL HIGH (ref 0.44–1.00)
GFR, Estimated: 35 mL/min — ABNORMAL LOW (ref >60)
Glucose, Bld: 83 mg/dL (ref 70–99)
Potassium: 3.5 mmol/L (ref 3.5–5.1)
Sodium: 137 mmol/L (ref 135–145)

## 2023-08-09 MED ORDER — METOPROLOL SUCCINATE ER 25 MG PO TB24
12.5000 mg | ORAL_TABLET | Freq: Every day | ORAL | Status: DC
  Administered 2023-08-09 – 2023-08-11 (×3): 12.5 mg via ORAL
  Filled 2023-08-09 (×3): qty 1

## 2023-08-09 NOTE — Progress Notes (Signed)
Speech Language Pathology Treatment: Dysphagia  Patient Details Name: Carol Rowe MRN: [id number] DOB: Jul 19, 1956 Today's Date: 08/09/2023 Time: 0921-0930 SLP Time Calculation (min) (ACUTE ONLY): 9 min  Assessment / Plan / Recommendation Clinical Impression  Pt was advanced to Dys 2 diet by MD on previous date. She has trouble remembering what she ate (says she has been feeling confused) but seems to think that she did not have any trouble with swallowing. She took very little POs with me, despite encouragement. Even when broken up into very small pieces, pt voices that graham crackers are too dry (but declines something to make them softer/less dry). Mastication seems to be prolonged with solids, but she does not necessarily have more intake when given liquids which are more swiftly cleared from her mouth. Recommend continuing Dys 2 diet and thin liquids with assistance during meals to also help encourage intake.    HPI HPI: 67 yo female presents to ED on 10/18 found down s/p fall at home with hypoglycemia and hypotension. + head trauma but CTH negative for acute findings. Workup for shock. Pt admitted to ICU 10/19 for pressor support, intubation, ETT 10/19-10/22, CRRT 10/19-10/22. EEG 10/23 suggestive of moderate diffuse encephalopathy. Pt also with mild rhabdo. Hospitalization further complicated by encephalopathy and ileus. PMH includes ETOH abuse, cirrhosis, HTN, COPD, smoker, gout.      SLP Plan  Continue with current plan of care      Recommendations for follow up therapy are one component of a multi-disciplinary discharge planning process, led by the attending physician.  Recommendations may be updated based on patient status, additional functional criteria and insurance authorization.    Recommendations  Diet recommendations: Dysphagia 2 (fine chop);Thin liquid Liquids provided via: Cup;Straw Medication Administration: Whole meds with liquid Supervision: Patient able to self  feed;Full supervision/cueing for compensatory strategies Compensations: Minimize environmental distractions;Slow rate;Small sips/bites Postural Changes and/or Swallow Maneuvers: Seated upright 90 degrees                  Oral care BID   Frequent or constant Supervision/Assistance Dysphagia, oral phase (R13.11)     Continue with current plan of care     Mahala Menghini., M.A. CCC-SLP Acute Rehabilitation Services Office [phone]  Secure chat preferred   08/09/2023, 9:37 AM

## 2023-08-09 NOTE — TOC Progression Note (Signed)
Transition of Care [facility]) - Progression Note    Patient Details  Name: Carol Rowe MRN: [id number] Date of Birth: 08/08/1956  Transition of Care [facility]) CM/SW Contact  Mearl Latin, [place] Phone Number: 08/09/2023, 1:23 PM  Clinical Narrative:    [facility] awaiting insurance authorization.    Expected Discharge Plan: Skilled Nursing Facility Barriers to Discharge: Continued Medical Work up, English as a second language teacher  Expected Discharge Plan and Services In-house Referral: Clinical Social Work Discharge Planning Services: [employer] Consult Post Acute Care Choice: Skilled Nursing Facility Living arrangements for the past 2 months: Single Family Home                                       Social Determinants of Health (SDOH) Interventions SDOH Screenings   Tobacco Use: High Risk (07/27/2023)    Readmission Risk Interventions    08/07/2023    4:25 PM  Readmission Risk Prevention Plan  Transportation Screening Complete  Medication Review (RN Care Manager) Complete  PCP or Specialist appointment within 3-5 days of discharge Complete  HRI or Home Care Consult Complete  SW Recovery Care/Counseling Consult Complete  Palliative Care Screening Not Applicable  Skilled Nursing Facility Complete

## 2023-08-09 NOTE — Progress Notes (Signed)
PROGRESS NOTE    Carol Rowe  ZDG:[id number] DOB: [birth year] DOA: 07/27/2023 PCP: Benita Stabile, MD  Brief Narrative: Carol Rowe is a 67 y.o. female with a history of primary hypertension, COPD, alcohol use disorder, hyperlipidemia-who presented with septic shock secondary to PNA-initially admitted at [facility]-transferred to [facility] ICU for intubation/vasopressor support.  Stabilized with antibiotics-subsequently extubated-further hospital course complicated by development of AKI requiring CRRT, encephalopathy, ileus-slowly improving with supportive care.  See below for further details.    Significant studies: 10/18>> CT head: No acute intracranial process 10/18>> CT C-spine: No fracture 10/19>> echo: EF 50-55% 10/23>> CT abdomen/pelvis: Possible ileus/partial SBO 10/26>> CXR: Patchy bibasilar consolidation.  Microbiology 10/18>> blood culture: No growth 10/20>> tracheal aspirate: Candida albicans (colonization) 10/25>> blood culture: No growth 10/25>> GI pathogen panel: Not detected 10/25>> C. difficile PCR: No growth  Consultants:  [facility]  Procedures:  CRRT ETT  Subjective Sitting in bedside chair-completely awake/alert-no complaints.  No diarrhea.  Foley removed yesterday-voiding spontaneously.  Physical exam Gen Exam:Alert awake-not in any distress HEENT:atraumatic, normocephalic Chest: B/L clear to auscultation anteriorly CVS:S1S2 regular Abdomen:soft non tender, non distended Extremities:no edema Neurology: Non focal Skin: no rash  Assessment and Plan: Acute hypoxic respiratory failure (extubated 10/22) Septic shock secondary to PNA Sepsis physiology has resolved Required pressors-which were weaned off on 10/22 Completed a course of antibiotics with Zosyn  Fever 10/25-likely secondary to aspiration pneumonia  Afebrile overnight-but leukocytosis likely worse-no other sources of infection apparent-does not have a toxic  appearance/looks stable. Culture data negative so far Remains on Augmentin Follow CBC  AKI Secondary to ischemic ATN from septic shock Briefly required CRRT while in the ICU Renal function continues to slowly improve Follow electrolytes Avoid nephrotoxic agents  Acute toxic metabolic encephalopathy Felt to be a combination of acute illness/AKI/sedation Significantly better-completely awake and alert this morning  GI bleeding with acute blood loss anemia Etiology thought to be ischemic colitis/infectious colitis Stool studies negative GI bleeding has resolved-brown stools In rectal tube. GI recommending outpatient endoscopy evaluation  Ileus Resolved-tolerating diet-having BMs No abdominal pain/vomiting Tolerating full liquids-advance to dysphagia 2 diet  Multifactorial anemia Combination of anemia due to acute illness/acute blood loss anemia due to GI bleeding Required 1 unit of PRBC on 10/24-Hb stable since then Follow CBC periodically  Thrombocytopenia, resolved Due to sepsis-resolved Follow CBC periodically  Hyponatremia Resolved with IV fluids/volume management with CRRT  Hypokalemia Secondary to GI loss-loose stools overnight No longer on lactulose Replete/recheck.  Non anion gap metabolic acidosis Secondary to diarrhea/resolving AKI Slowly improved-continue bicarb supplementation. Abdomen soft-follow-up  Possible cirrhosis Seen incidentally on CT abdomen No clinical stigmata Stable for further possible workup in the outpatient setting when she is more stable/recovered from acute illness.   Left thyroid nodule 2.9 cm Seen incidentally on CT C-spine Stable for outpatient follow-up  Debility/deconditioning PT/OT eval-SNF recommended.  Nutrition Status: Nutrition Problem: Severe Malnutrition Etiology: chronic illness Signs/Symptoms: severe fat depletion, severe muscle depletion Interventions: Tube feeding   DVT prophylaxis: Lovenox  Code Status:    Code Status: Full Code  Family Communication: Son-[phone]-updated over the phone 10/29  Data Reviewed: I have personally reviewed following labs and imaging studies  CBC Lab Results  Component Value Date   WBC 18.4 (H) 08/09/2023   RBC 2.91 (L) 08/09/2023   HGB 9.4 (L) 08/09/2023   HCT 28.9 (L) 08/09/2023   MCV 99.3 08/09/2023   MCH 32.3 08/09/2023   PLT 336 08/09/2023   MCHC 32.5 08/09/2023  RDW 18.4 (H) 08/09/2023   LYMPHSABS 0.5 (L) 07/27/2023   MONOABS 0.3 07/27/2023   EOSABS 0.0 07/27/2023   BASOSABS 0.0 07/27/2023     Last metabolic panel Lab Results  Component Value Date   NA 137 08/09/2023   K 3.5 08/09/2023   CL 114 (H) 08/09/2023   CO2 15 (L) 08/09/2023   BUN 13 08/09/2023   CREATININE 1.63 (H) 08/09/2023   GLUCOSE 83 08/09/2023   GFRNONAA 35 (L) 08/09/2023   GFRAA >60 09/03/2017   CALCIUM 8.3 (L) 08/09/2023   PHOS 3.8 08/08/2023   PROT 5.0 (L) 08/07/2023   ALBUMIN <1.5 (L) 08/08/2023   BILITOT 1.1 08/07/2023   ALKPHOS 157 (H) 08/07/2023   AST 21 08/07/2023   ALT 28 08/07/2023   ANIONGAP 8 08/09/2023    GFR: Estimated Creatinine Clearance: 27 mL/min (A) (by C-G formula based on SCr of 1.63 mg/dL (H)).  Recent Results (from the past 240 hour(s))  Gastrointestinal Panel by PCR , Stool     Status: None   Collection Time: 08/03/23  3:36 PM   Specimen: Stool  Result Value Ref Range Status   Campylobacter species NOT DETECTED NOT DETECTED Final   Plesimonas shigelloides NOT DETECTED NOT DETECTED Final   Salmonella species NOT DETECTED NOT DETECTED Final   Yersinia enterocolitica NOT DETECTED NOT DETECTED Final   Vibrio species NOT DETECTED NOT DETECTED Final   Vibrio cholerae NOT DETECTED NOT DETECTED Final   Enteroaggregative E coli (EAEC) NOT DETECTED NOT DETECTED Final   Enteropathogenic E coli (EPEC) NOT DETECTED NOT DETECTED Final   Enterotoxigenic E coli (ETEC) NOT DETECTED NOT DETECTED Final   Shiga like toxin producing E coli  (STEC) NOT DETECTED NOT DETECTED Final   Shigella/Enteroinvasive E coli (EIEC) NOT DETECTED NOT DETECTED Final   Cryptosporidium NOT DETECTED NOT DETECTED Final   Cyclospora cayetanensis NOT DETECTED NOT DETECTED Final   Entamoeba histolytica NOT DETECTED NOT DETECTED Final   Giardia lamblia NOT DETECTED NOT DETECTED Final   Adenovirus F40/41 NOT DETECTED NOT DETECTED Final   Astrovirus NOT DETECTED NOT DETECTED Final   Norovirus GI/GII NOT DETECTED NOT DETECTED Final   Rotavirus A NOT DETECTED NOT DETECTED Final   Sapovirus (I, II, IV, and V) NOT DETECTED NOT DETECTED Final    Comment: Performed at [facility], [address] [place], [place] [address]  C Difficile Quick Screen (NO PCR Reflex)     Status: None   Collection Time: 08/03/23  3:36 PM   Specimen: STOOL  Result Value Ref Range Status   C Diff antigen NEGATIVE NEGATIVE Final   C Diff toxin NEGATIVE NEGATIVE Final   C Diff interpretation No C. difficile detected.  Final    Comment: Performed at [facility], [address] [place], [place] [address]  Culture, blood (Routine X 2) w Reflex to ID Panel     Status: None   Collection Time: 08/03/23  7:27 PM   Specimen: BLOOD  Result Value Ref Range Status   Specimen Description BLOOD SITE NOT SPECIFIED  Final   Special Requests   Final    BOTTLES DRAWN AEROBIC AND ANAEROBIC Blood Culture adequate volume   Culture   Final    NO GROWTH 5 DAYS Performed at [facility], [address] [place], [place] [address]    Report Status 08/08/2023 FINAL  Final  Culture, blood (Routine X 2) w Reflex to ID Panel     Status: None  Collection Time: 08/03/23  7:27 PM   Specimen: BLOOD  Result Value Ref Range Status   Specimen Description BLOOD SITE NOT SPECIFIED  Final   Special Requests   Final    BOTTLES DRAWN AEROBIC AND ANAEROBIC Blood Culture results may not be optimal due to an excessive volume of blood received in culture bottles   Culture   Final    NO  GROWTH 5 DAYS Performed at [facility], [address], [place], [place] [address]    Report Status 08/08/2023 FINAL  Final      Radiology Studies: No results found.    LOS: 13 days    Jeoffrey Massed, MD [facility] 08/09/2023, 1:10 PM   If 7PM-7AM, please contact night-coverage [email or web address]

## 2023-08-09 NOTE — Plan of Care (Signed)

## 2023-08-10 DIAGNOSIS — R579 Shock, unspecified: Secondary | ICD-10-CM | POA: Diagnosis not present

## 2023-08-10 DIAGNOSIS — G9340 Encephalopathy, unspecified: Secondary | ICD-10-CM | POA: Diagnosis not present

## 2023-08-10 DIAGNOSIS — E871 Hypo-osmolality and hyponatremia: Secondary | ICD-10-CM | POA: Diagnosis not present

## 2023-08-10 DIAGNOSIS — E876 Hypokalemia: Secondary | ICD-10-CM | POA: Diagnosis not present

## 2023-08-10 LAB — BASIC METABOLIC PANEL
Anion gap: 9 (ref 5–15)
BUN: 13 mg/dL (ref 8–23)
CO2: 14 mmol/L — ABNORMAL LOW (ref 22–32)
Calcium: 7.7 mg/dL — ABNORMAL LOW (ref 8.9–10.3)
Chloride: 117 mmol/L — ABNORMAL HIGH (ref 98–111)
Creatinine, Ser: 1.61 mg/dL — ABNORMAL HIGH (ref 0.44–1.00)
GFR, Estimated: 35 mL/min — ABNORMAL LOW (ref >60)
Glucose, Bld: 93 mg/dL (ref 70–99)
Potassium: 3.5 mmol/L (ref 3.5–5.1)
Sodium: 140 mmol/L (ref 135–145)

## 2023-08-10 LAB — GLUCOSE, CAPILLARY
Glucose-Capillary: 96 mg/dL (ref 70–99)
Glucose-Capillary: 88 mg/dL (ref 70–99)
Glucose-Capillary: 87 mg/dL (ref 70–99)
Glucose-Capillary: 79 mg/dL (ref 70–99)
Glucose-Capillary: 80 mg/dL (ref 70–99)

## 2023-08-10 MED ORDER — LOPERAMIDE HCL 2 MG PO CAPS
2.0000 mg | ORAL_CAPSULE | ORAL | Status: DC | PRN
  Administered 2023-08-10: 2 mg via ORAL
  Filled 2023-08-10: qty 1

## 2023-08-10 MED ORDER — AMOXICILLIN-POT CLAVULANATE 500-125 MG PO TABS
1.0000 | ORAL_TABLET | Freq: Two times a day (BID) | ORAL | Status: AC
  Administered 2023-08-10: 1 via ORAL
  Filled 2023-08-10: qty 1

## 2023-08-10 MED ORDER — ENSURE ENLIVE PO LIQD
237.0000 mL | Freq: Three times a day (TID) | ORAL | Status: DC
  Administered 2023-08-10 – 2023-08-11 (×3): 237 mL via ORAL

## 2023-08-10 MED ORDER — ZINC SULFATE 220 (50 ZN) MG PO CAPS
220.0000 mg | ORAL_CAPSULE | Freq: Every day | ORAL | Status: DC
  Administered 2023-08-11: 220 mg via ORAL
  Filled 2023-08-10: qty 1

## 2023-08-10 MED ORDER — ADULT MULTIVITAMIN W/MINERALS CH
1.0000 | ORAL_TABLET | Freq: Every day | ORAL | Status: DC
  Administered 2023-08-11: 1 via ORAL
  Filled 2023-08-10: qty 1

## 2023-08-10 NOTE — Discharge Instructions (Signed)
 In a time of Crisis: [facility]  Mobile Crisis Management provides immediate crisis response, 24/7.  Call [phone]  [facility] for MH/DD/[facility] is available 24 hours a day, 7 days a week. Customer Service Specialists will assist you to find a crisis provider that is well-matched with your needs. Your local number is: [phone]  [facility] Care (BHUC) IOP, individual counseling, medication management [address] [place], [place] [address] [phone] Call for intake hours; Medicaid and Uninsured    Substance Use Outpatient Providers  [facility] ([facility]) Group and individual counseling. [address]  [place], [place] [address] [phone] [place]: [phone]  [place]: [phone] Medicaid and uninsured.   [facility] Offers IOP groups multiple times per week. [address] Sherian Maroon [place], [place] [address] [phone] Takes Medicaid and other insurances.   [facility]  Chemical Dependency Intensive Outpatient Program (IOP) [address] [place], [place] [address] [phone] Takes Nurse, learning disability and [place].   [facility]  IOP and Partial Hospitalization Program  [address]  [place], [place] [address] [phone] Private Insurance, [place] only for partial hospitalization  [facility] [address], [address], [place], [place] [address] [phone] Monday-Friday. Short and Long term options.  [facility]gent Care (BHUC) IOP, individual counseling, medication management [address] [place], [place] [address] [phone] Medicaid and [facility]  [facility] [address]  [place], [place] [address] [phone] Private Insurance and Self Pay   [facility] Outpatient [address]  [place], [place] [address] [phone] Private Insurance, [place], and Self Pay   [facility]  [address] [place], [place] [address] [facility]  [address]  [place], [place] [address] [phone]  [facility]  [address] [place], [place] [address] [phone]      Residential Treatment Programs  [facility] [address] [place], [place] [address] [phone] or [phone] Detox and Residential Rehab 21 days (Medicaid, private insurance, and self pay. If Medicare, will look into funding). No methadone. Call for pre-screen.   [facility]) [address]  [place], [place] [address] [phone] Detox 3-7 days (self Pay and Medicaid Limited availability). Transitional Program for females needs 60 days clean first.  Rehab Only for Males (Medicare, Medicaid, and Self Pay)-No methadone.  [facility] [address] [place], [place] [address] [phone] or [phone] Private Insurance only  [facility] PHONE: [phone] FAX: [phone] Residential program for women 21 and over for up to a year through a Christian 12-step recovery model. Self-pay.    [facility] [address] [place], [place] [address] Phone:  [phone] Must be detoxed 72 hours prior to admission; 28 day program.  Self-pay.  [facility] [address]  [place], [place] [phone] [employer], Medicare, [place] (not straight [place]). They offer assistance with transportation.   [facility] [address] [place],  [place], [place] [address] [phone] Christian Based Program. Men only. No insurance  [employer] is a substance use disorder treatment program for women, including those who are pregnant, parenting, and/or whose lives have been touched by abuse and violence. (800)  [phone]  [facility]  [address]  [place], [place] [address] Women's: [phone] Men's: [phone] No Medicaid.   [facility] [place] Locations across the [place] (mainly [place]) willing to help with transportation.  [phone] [employer] [facility]  [address] [place]  [place], [place] [address] [phone] Treatment Only, must make assessment appointment, and must be sober for assessment appointment. Self pay, [facility], must be [facility] resident. No methadone.   [facility]  [address] [place], [place] [address] [phone] No pending legal charges, Long-term work program. No methadone. Call for assessment.  [facility]  [address], [place], [place] [address] [phone] or [phone] Commercial Insurance Only  [facility] - [phone] Private Insurance (no [place]). Males/Females, call to make referrals, multiple facilities.   [facility]: [facility] [address] [place], [place] [address] [phone]  [facility]-no methadone: [facility] [address] [place], [place] [address] [phone] Lacy Duverney [facility] [phone] [place], [place] For women, houses 8 residents for sober living. No Medicaid.         AA Meetings Website to locate meetings (virtually or in person): [email or web address].[email or web address].biz/ Phone: [phone]  Syringe Services Program: Due to COVID-19, syringe services programs are likely operating under different hours with limited or no fixed site hours. Some programs may not be operating at all. Please contact the program directly using the phone numbers provided below to see if they are still operating under COVID-19. [facility] Solution to the Opioid Problem (GCSTOP) Fixed;  mobile; peer-based;Bobbye Riggs) [phone] [email or web address] Fixed site exchange at [facility], [address] [place], [place] [address] on Wednesdays (2:00 - 5:00 pm) and Thursdays (4:00 - 8:00 pm). Pop-up mobile exchange locations: [employer], [address] [place], [place] [address] on Tuesdays (11:00 am - 1:00 pm) and Fridays (11:00 am - 1:00 pm) -[facility] - [address], [place], [place] [address] on Tuesdays (2:00 - 4:00 pm) and Fridays (2:00 - 4:00 pm) -[employer] copy - also serves Radio broadcast assistant and [employer] [place] [employer];Fixed; mobile; peer-based; Lendon Ka [phone] [email or web address] [address] [place], [place] [address] Delivery and outreach available in [place] and [place], please call for more information. Monday, Tuesday: 1:00 -7:00 pm, Thursday: 4:00 pm - 8:00 pm, Friday: 1:00 pm - 8:00 pm)  Medication-Assisted Treatment (MAT):  -[facility]- services [address] and surrounding areas including [place], [place], [place], [place], [address], [place], [place], [place], [place], and [place], [place]. Options include Methadone, buprenorphine or Suboxone. [address], [place], [place] [address] Phone: [phone] Mon - Fri: 5:30am - 2:00pm Sat: 5:30am -7:30am Sun: Closed Holidays: 6:00am - 8:00am  -[facility]- We use FDA-approved medications, like methadone/suboxone/sublocade, and vivitrol. These medications are then combined with customized care plans that include individual or group counseling, toxicology, and medical care directed by on-site physicians. Accepts most insurance plans, Medicaid, and private pay.  [address] [place], [place] [address] Phone: [phone] Monday-Friday 5:00 AM - 10:00 AM Saturday 6:00 AM - 8:30 AM Sunday 6:00 AM - 7:00 AM  -[facility]- [facility] is a treatment & recovery focused  program. In addition to receiving methadone medication, our clients participate in individual and group counseling as well as random drug testing. If accepted into the [facility] Opioid Program, you will be provided several intake appointments and a physical exam  [address], [place] [address] Office: [phone]  Fax: [phone]  -[facility]- We put our community members at the center of everything we do, for remote treatment services as well as in-person, from alcohol withdrawal to opioid use and more.  [address], [place], [place] [address] [phone] Monday-Wednesday: 9:00am - 5:00pm Thursday: 9:00am - 6:00pm Friday: 9:00am - 5:00pm Saturday: 9:00am - 1:00pm Sunday: Closed  -[facility] [place]) [address] [place], [place] [address] [phone]  [place] [phone] [address] [place], [place] [address]  M-W    5:00am-12:00pm Thu     5:00am-10:00am Fri       5:00am-12:00pm Sat      5:00am-8:00am Sun     Closed  $12/daily for Methadone Treatment.

## 2023-08-10 NOTE — Progress Notes (Signed)
PROGRESS NOTE    Carol Rowe  GNF:[id number] DOB: Jan 20, 1956 DOA: 07/27/2023 PCP: Benita Stabile, MD  Brief Narrative: Carol Rowe is a 67 y.o. female with a history of primary hypertension, COPD, alcohol use disorder, hyperlipidemia-who presented with septic shock secondary to PNA-initially admitted at [facility]-transferred to [facility] ICU for intubation/vasopressor support.  Stabilized with antibiotics-subsequently extubated-further hospital course complicated by development of AKI requiring CRRT, encephalopathy, ileus-slowly improving with supportive care.  See below for further details.    Significant studies: 10/18>> CT head: No acute intracranial process 10/18>> CT C-spine: No fracture 10/19>> echo: EF 50-55% 10/23>> CT abdomen/pelvis: Possible ileus/partial SBO 10/26>> CXR: Patchy bibasilar consolidation.  Microbiology 10/18>> blood culture: No growth 10/20>> tracheal aspirate: Candida albicans (colonization) 10/25>> blood culture: No growth 10/25>> GI pathogen panel: Not detected 10/25>> C. difficile PCR: No growth  Consultants:  [facility]  Procedures:  CRRT ETT  Subjective Some more diarrhea yesterday and overnight-patient denies any other major issues.  Denies any abdominal pain.  Lying comfortably in bed.  Physical exam Gen Exam:Alert awake-not in any distress HEENT:atraumatic, normocephalic Chest: B/L clear to auscultation anteriorly CVS:S1S2 regular Abdomen:soft non tender, non distended Extremities:no edema Neurology: Non focal Skin: no rash  Assessment and Plan: Acute hypoxic respiratory failure (extubated 10/22) Septic shock secondary to PNA Sepsis physiology has resolved Required pressors-which were weaned off on 10/22 Completed a course of antibiotics with Zosyn  Fever 10/25-likely secondary to aspiration pneumonia  Afebrile-leukocytosis continues to fluctuate but overall clinically improved-nontoxic-appearing.    Culture data negative so far Remains on Augmentin-last dose on 11/1 to complete a 7-day course. Follow CBC vertically  AKI Secondary to ischemic ATN from septic shock Briefly required CRRT while in the ICU Renal function continues to slowly improve Follow electrolytes Avoid nephrotoxic agents  Acute toxic metabolic encephalopathy Felt to be a combination of acute illness/AKI/sedation Significantly better-completely awake and alert this morning  GI bleeding with acute blood loss anemia Etiology thought to be ischemic colitis/infectious colitis Stool studies negative GI bleeding has resolved-brown stools In rectal tube. GI recommending outpatient endoscopy evaluation  Ileus Resolved-tolerating diet-having BMs-in fact several loose BMs overnight. No abdominal pain/vomiting Diet advanced to dysphagia 2 diet  Diarrhea Continues to have intermittent diarrhea Per nursing staff-more diarrhea in the past 24 hours-likely the reason for persistent metabolic acidosis Stool studies negative Stop MiraLAX As needed Imodium added today  Multifactorial anemia Combination of anemia due to acute illness/acute blood loss anemia due to GI bleeding Required 1 unit of PRBC on 10/24-Hb stable since then Follow CBC periodically  Thrombocytopenia, resolved Due to sepsis-resolved Follow CBC periodically  Hyponatremia Resolved with IV fluids/volume management with CRRT  Hypokalemia Secondary to GI loss-loose stools overnight No longer on lactulose Replete/recheck.  Non anion gap metabolic acidosis Secondary to diarrhea/resolving AKI Unfortunately having more diarrhea overnight Continue bicarb supplementation-treat underlying diarrhea-see above.  Possible cirrhosis Seen incidentally on CT abdomen No clinical stigmata Stable for further possible workup in the outpatient setting when she is more stable/recovered from acute illness.   Left thyroid nodule 2.9 cm Seen incidentally on CT  C-spine Stable for outpatient follow-up  Debility/deconditioning PT/OT eval-SNF recommended.  Nutrition Status: Nutrition Problem: Severe Malnutrition Etiology: chronic illness Signs/Symptoms: severe fat depletion, severe muscle depletion Interventions: Tube feeding   DVT prophylaxis: Lovenox  Code Status:   Code Status: Full Code  Family Communication: Son-Will-[phone]-updated over the phone 10/29  Data Reviewed: I have personally reviewed following labs and imaging studies  CBC Lab Results  Component Value Date   WBC 18.4 (H) 08/09/2023   RBC 2.91 (L) 08/09/2023   HGB 9.4 (L) 08/09/2023   HCT 28.9 (L) 08/09/2023   MCV 99.3 08/09/2023   MCH 32.3 08/09/2023   PLT 336 08/09/2023   MCHC 32.5 08/09/2023   RDW 18.4 (H) 08/09/2023   LYMPHSABS 0.5 (L) 07/27/2023   MONOABS 0.3 07/27/2023   EOSABS 0.0 07/27/2023   BASOSABS 0.0 07/27/2023     Last metabolic panel Lab Results  Component Value Date   NA 140 08/10/2023   K 3.5 08/10/2023   CL 117 (H) 08/10/2023   CO2 14 (L) 08/10/2023   BUN 13 08/10/2023   CREATININE 1.61 (H) 08/10/2023   GLUCOSE 93 08/10/2023   GFRNONAA 35 (L) 08/10/2023   GFRAA >60 09/03/2017   CALCIUM 7.7 (L) 08/10/2023   PHOS 3.8 08/08/2023   PROT 5.0 (L) 08/07/2023   ALBUMIN <1.5 (L) 08/08/2023   BILITOT 1.1 08/07/2023   ALKPHOS 157 (H) 08/07/2023   AST 21 08/07/2023   ALT 28 08/07/2023   ANIONGAP 9 08/10/2023    GFR: Estimated Creatinine Clearance: 28.8 mL/min (A) (by C-G formula based on SCr of 1.61 mg/dL (H)).  Recent Results (from the past 240 hour(s))  Gastrointestinal Panel by PCR , Stool     Status: None   Collection Time: 08/03/23  3:36 PM   Specimen: Stool  Result Value Ref Range Status   Campylobacter species NOT DETECTED NOT DETECTED Final   Plesimonas shigelloides NOT DETECTED NOT DETECTED Final   Salmonella species NOT DETECTED NOT DETECTED Final   Yersinia enterocolitica NOT DETECTED NOT DETECTED Final   Vibrio  species NOT DETECTED NOT DETECTED Final   Vibrio cholerae NOT DETECTED NOT DETECTED Final   Enteroaggregative E coli (EAEC) NOT DETECTED NOT DETECTED Final   Enteropathogenic E coli (EPEC) NOT DETECTED NOT DETECTED Final   Enterotoxigenic E coli (ETEC) NOT DETECTED NOT DETECTED Final   Shiga like toxin producing E coli (STEC) NOT DETECTED NOT DETECTED Final   Shigella/Enteroinvasive E coli (EIEC) NOT DETECTED NOT DETECTED Final   Cryptosporidium NOT DETECTED NOT DETECTED Final   Cyclospora cayetanensis NOT DETECTED NOT DETECTED Final   Entamoeba histolytica NOT DETECTED NOT DETECTED Final   Giardia lamblia NOT DETECTED NOT DETECTED Final   Adenovirus F40/41 NOT DETECTED NOT DETECTED Final   Astrovirus NOT DETECTED NOT DETECTED Final   Norovirus GI/GII NOT DETECTED NOT DETECTED Final   Rotavirus A NOT DETECTED NOT DETECTED Final   Sapovirus (I, II, IV, and V) NOT DETECTED NOT DETECTED Final    Comment: Performed at [facility], [address] [place], [place] [address]  C Difficile Quick Screen (NO PCR Reflex)     Status: None   Collection Time: 08/03/23  3:36 PM   Specimen: STOOL  Result Value Ref Range Status   C Diff antigen NEGATIVE NEGATIVE Final   C Diff toxin NEGATIVE NEGATIVE Final   C Diff interpretation No C. difficile detected.  Final    Comment: Performed at [facility], [address], [place], [place] [address]  Culture, blood (Routine X 2) w Reflex to ID Panel     Status: None   Collection Time: 08/03/23  7:27 PM   Specimen: BLOOD  Result Value Ref Range Status   Specimen Description BLOOD SITE NOT SPECIFIED  Final   Special Requests   Final    BOTTLES DRAWN AEROBIC AND ANAEROBIC Blood Culture adequate volume  Culture   Final    NO GROWTH 5 DAYS Performed at [facility], [address] [place], [place] [address]    Report Status 08/08/2023 FINAL  Final  Culture, blood (Routine X 2) w Reflex to ID Panel     Status: None   Collection Time:  08/03/23  7:27 PM   Specimen: BLOOD  Result Value Ref Range Status   Specimen Description BLOOD SITE NOT SPECIFIED  Final   Special Requests   Final    BOTTLES DRAWN AEROBIC AND ANAEROBIC Blood Culture results may not be optimal due to an excessive volume of blood received in culture bottles   Culture   Final    NO GROWTH 5 DAYS Performed at [facility], [address] [place], [place] [address]    Report Status 08/08/2023 FINAL  Final      Radiology Studies: No results found.    LOS: 14 days    Jeoffrey Massed, MD [facility] 08/10/2023, 11:42 AM   If 7PM-7AM, please contact night-coverage [email or web address]

## 2023-08-10 NOTE — TOC Progression Note (Addendum)
Transition of Care [facility]) - Progression Note    Patient Details  Name: Carol Rowe MRN: [id number] Date of Birth: May 06, 1956  [facility]) CM/SW Contact  Mearl Latin, LCSW Phone Number: 08/10/2023, 9:12 AM  Clinical Narrative:    9am-CSW requested update on insurance authorization from [facility]. Per MD, patient likely stable for discharge tomorrow.   3:36 PM-[facility] has received insurance approval and request weekend CSW check in with Debbie tomorrow to make sure bed is still available.    Expected Discharge Plan: Skilled Nursing Facility Barriers to Discharge: Continued Medical Work up, English as a second language teacher  Expected Discharge Plan and Services In-house Referral: Clinical Social Work Discharge Planning Services: [employer] Consult Post Acute Care Choice: Skilled Nursing Facility Living arrangements for the past 2 months: Single Family Home                                       Social Determinants of Health (SDOH) Interventions SDOH Screenings   Tobacco Use: High Risk (07/27/2023)    Readmission Risk Interventions    08/07/2023    4:25 PM  Readmission Risk Prevention Plan  Transportation Screening Complete  Medication Review (RN Care Manager) Complete  PCP or Specialist appointment within 3-5 days of discharge Complete  HRI or Home Care Consult Complete  SW Recovery Care/Counseling Consult Complete  Palliative Care Screening Not Applicable  Skilled Nursing Facility Complete

## 2023-08-10 NOTE — Progress Notes (Signed)
Nutrition Follow-up  DOCUMENTATION CODES:   Severe malnutrition in context of chronic illness  INTERVENTION:  Provide Ensure Enlive po TID, each supplement provides 350 kcal and 20 grams of protein.  Provide multivitamin with minerals po daily.  Will put stop date of 14 days on zinc sulfate as prolonged intake of zinc supplementation can lead to copper deficiency.  NUTRITION DIAGNOSIS:   Severe Malnutrition related to chronic illness as evidenced by severe fat depletion, severe muscle depletion.  Ongoing - addressing with interventions.  GOAL:   Patient will meet greater than or equal to 90% of their needs  Progressing with interventions and improved PO intake per patient's report.  MONITOR:   Vent status, Labs, Weight trends, I & O's, TF tolerance  REASON FOR ASSESSMENT:   Consult, Ventilator Enteral/tube feeding initiation and management (CRRT protocol)  ASSESSMENT:   Pt transferred from [facility] with diagnosis of shock. PMH significant for alcohol use disorder, alcoholic liver cirrhosis, essential HTN, presumed COPD.  10/18: admitted w/hypoglycemia, metabolic encephalopathy, severe AKI 10/19: intubated; non-TDC placed for CRRT initiation, TF started at 15 ml/hr 10/22 CRRT discontinued, Extubated 10/23: significant emesis, NG for decompression  10/27: NG tube removed 10/28: s/p MBS, approved for dysphagia 2 diet with thin liquids per SLP, ordered for clear liquids 10/29: advanced to full liquids 10/30: diet advanced to dysphagia 2 with thin liquids  Met with pt at bedside. She reports her appetite is improving. Limited PO intake available since diet was advanced. Documented to have 100% of breakfast on 10/30. Pt reports she has been eating 2-3 meals per day and she is finishing 100% of meals. Discussed importance of adequate intake of calories and protein. Pt amenable to drinking Ensure to help meet calorie/protein needs. Pt also requesting assistance with ordering meals.  Will update order to reflect this. Pt denies nausea or emesis. Reports she had some abdominal pain a few days ago but that this has now resolved.   Admission 25 55.8 kg on 10/18. Weights fluctuated in chart during admission - suspect related to fluid status. Current wt 55.4 kg today. Recommend continuing to monitor trends.  Medications reviewed and include: Augmentin, calcium carbonate BID, folic acid 1 mg daily, Rena-vite at bedtime, pantoprazole, sodium bicarbonate 650 mg BID, thiamine 100 mg daily, zinc sulfate 220 mg daily  Labs reviewed: CBG 82-113, Chloride 117, CO2 14, Creatinine 1.61  UOP: no documented UOP in previous 24hrs  I/O: -5394.3 mL since admission  Diet Order:   Diet Order             DIET DYS 2 Room service appropriate? Yes; Fluid consistency: Thin  Diet effective now                  EDUCATION NEEDS:   No education needs have been identified at this time  Skin:  Skin Assessment: Skin Integrity Issues: Skin Integrity Issues:: Other (Comment) Other: 1 cm wound to left arm (type of wound not specified)  Last BM:  08/09/23 - large type 7  Height:   Ht Readings from Last 1 Encounters:  07/27/23 5\' 3"  (1.6 m)   Weight:   Wt Readings from Last 1 Encounters:  08/10/23 55.4 kg   BMI:  Body mass index is 21.64 kg/m.  Estimated Nutritional Needs:   Kcal:  1600-1800  Protein:  80-90 grams  Fluid:  1.6-1.8 L/day  Letta Median, MS, RD, LDN, CNSC Pager number available on Amion

## 2023-08-11 DIAGNOSIS — F4322 Adjustment disorder with anxiety: Secondary | ICD-10-CM | POA: Diagnosis not present

## 2023-08-11 DIAGNOSIS — R1312 Dysphagia, oropharyngeal phase: Secondary | ICD-10-CM | POA: Diagnosis not present

## 2023-08-11 DIAGNOSIS — F1090 Alcohol use, unspecified, uncomplicated: Secondary | ICD-10-CM | POA: Diagnosis not present

## 2023-08-11 DIAGNOSIS — Z780 Asymptomatic menopausal state: Secondary | ICD-10-CM | POA: Diagnosis not present

## 2023-08-11 DIAGNOSIS — N179 Acute kidney failure, unspecified: Secondary | ICD-10-CM | POA: Diagnosis not present

## 2023-08-11 DIAGNOSIS — R630 Anorexia: Secondary | ICD-10-CM | POA: Diagnosis not present

## 2023-08-11 DIAGNOSIS — G928 Other toxic encephalopathy: Secondary | ICD-10-CM | POA: Diagnosis not present

## 2023-08-11 DIAGNOSIS — R279 Unspecified lack of coordination: Secondary | ICD-10-CM | POA: Diagnosis not present

## 2023-08-11 DIAGNOSIS — J449 Chronic obstructive pulmonary disease, unspecified: Secondary | ICD-10-CM | POA: Diagnosis not present

## 2023-08-11 DIAGNOSIS — K922 Gastrointestinal hemorrhage, unspecified: Secondary | ICD-10-CM | POA: Diagnosis not present

## 2023-08-11 DIAGNOSIS — D696 Thrombocytopenia, unspecified: Secondary | ICD-10-CM | POA: Diagnosis not present

## 2023-08-11 DIAGNOSIS — M6281 Muscle weakness (generalized): Secondary | ICD-10-CM | POA: Diagnosis not present

## 2023-08-11 DIAGNOSIS — J9601 Acute respiratory failure with hypoxia: Secondary | ICD-10-CM | POA: Diagnosis not present

## 2023-08-11 DIAGNOSIS — R579 Shock, unspecified: Secondary | ICD-10-CM | POA: Diagnosis not present

## 2023-08-11 DIAGNOSIS — R58 Hemorrhage, not elsewhere classified: Secondary | ICD-10-CM | POA: Diagnosis not present

## 2023-08-11 DIAGNOSIS — M1A9XX0 Chronic gout, unspecified, without tophus (tophi): Secondary | ICD-10-CM | POA: Diagnosis not present

## 2023-08-11 DIAGNOSIS — G629 Polyneuropathy, unspecified: Secondary | ICD-10-CM | POA: Diagnosis not present

## 2023-08-11 DIAGNOSIS — D62 Acute posthemorrhagic anemia: Secondary | ICD-10-CM | POA: Diagnosis not present

## 2023-08-11 DIAGNOSIS — I10 Essential (primary) hypertension: Secondary | ICD-10-CM | POA: Diagnosis not present

## 2023-08-11 DIAGNOSIS — R41841 Cognitive communication deficit: Secondary | ICD-10-CM | POA: Diagnosis not present

## 2023-08-11 DIAGNOSIS — Z7401 Bed confinement status: Secondary | ICD-10-CM | POA: Diagnosis not present

## 2023-08-11 DIAGNOSIS — A4152 Sepsis due to Pseudomonas: Secondary | ICD-10-CM | POA: Diagnosis not present

## 2023-08-11 LAB — CBC WITH DIFFERENTIAL/PLATELET
Abs Immature Granulocytes: 0 10*3/uL (ref 0.00–0.07)
Basophils Absolute: 0.1 10*3/uL (ref 0.0–0.1)
Basophils Relative: 1 %
Eosinophils Absolute: 0.2 10*3/uL (ref 0.0–0.5)
Eosinophils Relative: 2 %
HCT: 24.3 % — ABNORMAL LOW (ref 36.0–46.0)
Hemoglobin: 7.8 g/dL — ABNORMAL LOW (ref 12.0–15.0)
Lymphocytes Relative: 6 %
Lymphs Abs: 0.7 10*3/uL (ref 0.7–4.0)
MCH: 32.1 pg (ref 26.0–34.0)
MCHC: 32.1 g/dL (ref 30.0–36.0)
MCV: 100 fL (ref 80.0–100.0)
Monocytes Absolute: 0.5 10*3/uL (ref 0.1–1.0)
Monocytes Relative: 4 %
Neutro Abs: 10.8 10*3/uL — ABNORMAL HIGH (ref 1.7–7.7)
Neutrophils Relative %: 87 %
Platelets: 237 10*3/uL (ref 150–400)
RBC: 2.43 MIL/uL — ABNORMAL LOW (ref 3.87–5.11)
RDW: 18.6 % — ABNORMAL HIGH (ref 11.5–15.5)
WBC: 12.4 10*3/uL — ABNORMAL HIGH (ref 4.0–10.5)
nRBC: 0 % (ref 0.0–0.2)
nRBC: 0 /100{WBCs}

## 2023-08-11 LAB — BASIC METABOLIC PANEL
Anion gap: 6 (ref 5–15)
BUN: 9 mg/dL (ref 8–23)
CO2: 18 mmol/L — ABNORMAL LOW (ref 22–32)
Calcium: 7.5 mg/dL — ABNORMAL LOW (ref 8.9–10.3)
Chloride: 113 mmol/L — ABNORMAL HIGH (ref 98–111)
Creatinine, Ser: 1.35 mg/dL — ABNORMAL HIGH (ref 0.44–1.00)
GFR, Estimated: 44 mL/min — ABNORMAL LOW (ref >60)
Glucose, Bld: 106 mg/dL — ABNORMAL HIGH (ref 70–99)
Potassium: 3.5 mmol/L (ref 3.5–5.1)
Sodium: 137 mmol/L (ref 135–145)

## 2023-08-11 LAB — GLUCOSE, CAPILLARY
Glucose-Capillary: 100 mg/dL — ABNORMAL HIGH (ref 70–99)
Glucose-Capillary: 72 mg/dL (ref 70–99)
Glucose-Capillary: 84 mg/dL (ref 70–99)

## 2023-08-11 MED ORDER — SODIUM CHLORIDE 0.9 % IV SOLN: INTRAVENOUS | Status: DC

## 2023-08-11 MED ORDER — LOPERAMIDE HCL 2 MG PO CAPS: 2.0000 mg | ORAL_CAPSULE | ORAL | Status: DC | PRN

## 2023-08-11 MED ORDER — VITAMIN B-1 100 MG PO TABS: 100.0000 mg | ORAL_TABLET | Freq: Every day | ORAL | Status: DC

## 2023-08-11 MED ORDER — TORSEMIDE 20 MG PO TABS: ORAL_TABLET | ORAL | Status: DC

## 2023-08-11 NOTE — TOC Transition Note (Addendum)
Transition of Care [facility]) - CM/SW Discharge Note   Patient Details  Name: Carol Rowe MRN: [id number] Date of Birth: 1956-01-18  Transition of Care [facility]) CM/SW Contact:  Verna Czech [place], [place] Phone Number: 08/11/2023, 9:52 AM   Clinical Narrative:     Patient to discharge today to [facility] for nursing and rehab. Patient will be going to room A17 Bed 1. Report can be called in to [phone]. Patient has been informed of discharge to SNF today.  11:06am Phone call to patient's sister Carol Rowe to confirm discharge to [facility]. Carol Rowe confirms that she will plan to see patient at the facility this afternoon.  Kanoe Wanner, LCSW Transition of Care      Barriers to Discharge: Continued Medical Work up, English as a second language teacher   Patient Goals and CMS Choice   Choice offered to / list presented to : Sibling (Patients sister Carol Rowe)  Discharge Placement                         Discharge Plan and Services Additional resources added to the After Visit Summary for   In-house Referral: Clinical Social Work Discharge Planning Services: CM Consult Post Acute Care Choice: Skilled Nursing Facility                               Social Determinants of Health (SDOH) Interventions SDOH Screenings   Tobacco Use: High Risk (07/27/2023)     Readmission Risk Interventions    08/07/2023    4:25 PM  Readmission Risk Prevention Plan  Transportation Screening Complete  Medication Review (RN Care Manager) Complete  PCP or Specialist appointment within 3-5 days of discharge Complete  HRI or Home Care Consult Complete  SW Recovery Care/Counseling Consult Complete  Palliative Care Screening Not Applicable  Skilled Nursing Facility Complete

## 2023-08-11 NOTE — Plan of Care (Signed)

## 2023-08-11 NOTE — Discharge Summary (Signed)
Carol Rowe VHQ:[id number] DOB: 12/10/1955 DOA: 07/27/2023  PCP: Benita Stabile, MD  Admit date: 07/27/2023  Discharge date: 08/11/2023  Admitted From: Home   disposition: SNF   Recommendations for Outpatient Follow-up:   Follow up with PCP in 1-2 weeks  PCP Please obtain BMP/CBC, 2 view CXR in 1week,  (see Discharge instructions)   PCP Please follow up on the following pending results: Kindly arrange for outpatient GI and endocrine follow-up.   Home Health: None Equipment/Devices: None Consultations:  PCCM Nephrology [facility] Discharge Condition: Stable    CODE STATUS: Full    Diet Recommendation: Failure to diet with feeding assistance and aspiration precautions.  Chief Complaint  Patient presents with   Hypoglycemia   Hypotension   Fall     Brief history of present illness from the day of admission and additional interim summary    67 y.o. female with a history of primary hypertension, COPD, alcohol use disorder, hyperlipidemia-who presented with septic shock secondary to PNA-initially admitted at [facility]-transferred to [facility] ICU for intubation/vasopressor support.  Stabilized with antibiotics-subsequently extubated-further hospital course complicated by development of AKI requiring CRRT, encephalopathy, ileus-slowly improving with supportive care.  See below for further details.     Significant studies: 10/18>> CT head: No acute intracranial process 10/18>> CT C-spine: No fracture 10/19>> echo: EF 50-55% 10/23>> CT abdomen/pelvis: Possible ileus/partial SBO 10/26>> CXR: Patchy bibasilar consolidation.   Microbiology 10/18>> blood culture: No growth 10/20>> tracheal aspirate: Candida albicans (colonization) 10/25>> blood culture: No growth 10/25>> GI pathogen panel: Not  detected 10/25>> C. difficile PCR: No growth   Consultants:  PCCM Nephrology [facility]   Procedures:  CRRT ETT                                                                 Hospital Course   Acute hypoxic respiratory failure (extubated 10/22), due to septic shock secondary to aspiration pneumonia.  Sepsis physiology has resolved, briefly Required pressors-which were weaned off on 10/22, has finished her antibiotic course.  Clinically pneumonia has resolved.  Now on room air.  And symptom-free.   AKI Secondary to ischemic ATN from septic shock Briefly required CRRT while in the ICU Renal function much improved continue to monitor at SNF.   Acute toxic metabolic encephalopathy Felt to be a combination of acute illness/AKI/sedation Significantly better-completely awake and alert this morning, close to her baseline   GI bleeding with acute blood loss anemia Etiology thought to be ischemic colitis/infectious colitis Stool studies negative GI bleeding has resolved-brown stools In rectal tube. GI recommending outpatient endoscopy evaluation, check CBC in 2 to 3 days at [facility].   Ileus Resolved with supportive care subsequently had developed diarrhea which has also resolved.   Diarrhea Continues to have intermittent diarrhea due to recent antibiotic use  and stool softeners, resolved after supportive care.  Stable stool studies.    Multifactorial anemia Combination of anemia due to acute illness/acute blood loss anemia due to GI bleeding Required 1 unit of PRBC on 10/24-Hb stable since then Follow CBC periodically   Thrombocytopenia, resolved Due to sepsis-resolved Follow CBC periodically at SNF    Non anion gap metabolic acidosis Secondary to diarrhea/resolving AKI AKI much improved, diarrhea has resolved, was on oral bicarb supplements, since diarrhea has resolved and anion gap is stable will withhold further oral bicarb supplementation.  Repeat BMP in 2 to  3 days at [facility].   Possible cirrhosis Seen incidentally on CT abdomen No clinical stigmata Stable for further possible workup in the outpatient setting with PCP postdischarge will benefit from outpatient GI follow-up to be arranged by PCP.   Left thyroid nodule 2.9 cm Seen incidentally on CT C-spine Stable for outpatient follow-up with endocrine per PCP.   Discharge diagnosis     Principal Problem:   Shock (HCC) Active Problems:   Septic shock (HCC)   Protein-calorie malnutrition, severe   Lower GI bleed   Anemia, posthemorrhagic, acute   Abnormal finding on imaging of liver    Discharge instructions    Discharge Instructions     Discharge instructions   Complete by: As directed    Follow with Primary MD Benita Stabile, MD in 7 days   Get CBC, CMP, magnesium, phosphorus, 2 view Chest X ray -  checked next visit with your primary MD or SNF MD   Activity: As tolerated with Full fall precautions use walker/cane & assistance as needed  Disposition SNF  Diet: Dysphagia 2 diet with feeding assistance and aspiration precautions, continue speech therapy follow-up at SNF  Special Instructions: If you have smoked or chewed Tobacco  in the last 2 yrs please stop smoking, stop any regular Alcohol  and or any Recreational drug use.  On your next visit with your primary care physician please Get Medicines reviewed and adjusted.  Please request your Prim.MD to go over all Hospital Tests and Procedure/Radiological results at the follow up, please get all Hospital records sent to your Prim MD by signing hospital release before you go home.  If you experience worsening of your admission symptoms, develop shortness of breath, life threatening emergency, suicidal or homicidal thoughts you must seek medical attention immediately by calling 911 or calling your MD immediately  if symptoms less severe.  You Must read complete instructions/literature along with all the possible adverse  reactions/side effects for all the Medicines you take and that have been prescribed to you. Take any new Medicines after you have completely understood and accpet all the possible adverse reactions/side effects.   Do not drive when taking Pain medications.  Do not take more than prescribed Pain, Sleep and Anxiety Medications   Increase activity slowly   Complete by: As directed    No wound care   Complete by: As directed        Discharge Medications   Allergies as of 08/11/2023       Reactions   Diclofenac Other (See Comments)   Macrobid [nitrofurantoin Macrocrystal] Hives   Mirtazapine Swelling        Medication List     STOP taking these medications    ALPRAZolam 0.5 MG tablet Commonly known as: XANAX   aspirin EC 325 MG tablet   furosemide 40 MG tablet Commonly known as: LASIX   ramipril 2.5 MG capsule Commonly known as:  ALTACE       TAKE these medications    acetaminophen 500 MG tablet Commonly known as: TYLENOL Take 1,000 mg by mouth every 6 (six) hours as needed for moderate pain.   albuterol 108 (90 Base) MCG/ACT inhaler Commonly known as: VENTOLIN HFA Inhale 2 puffs into the lungs 2 (two) times daily as needed for shortness of breath.   allopurinol 100 MG tablet Commonly known as: ZYLOPRIM Take by mouth.   ascorbic acid 500 MG tablet Commonly known as: VITAMIN C Take 500 mg by mouth every other day.   B COMPLEX PO Take 1 Dose by mouth every other day. Liquid b complex   Benefiber Drink Mix Pack Take 4 g by mouth at bedtime.   cetirizine 10 MG tablet Commonly known as: ZYRTEC Take 10 mg by mouth daily as needed for allergies.   Dialyvite Vitamin D 5000 125 MCG (5000 UT) capsule Generic drug: Cholecalciferol Take 5,000 Units by mouth every other day.   estradiol 0.1 MG/24HR patch Commonly known as: VIVELLE-DOT Place 1 patch onto the skin 2 (two) times a week.   fluticasone 50 MCG/ACT nasal spray Commonly known as: FLONASE Place 2  sprays into both nostrils daily.   folic acid 1 MG tablet Commonly known as: FOLVITE   levocetirizine 5 MG tablet Commonly known as: XYZAL Take 5 mg by mouth at bedtime.   loperamide 2 MG capsule Commonly known as: IMODIUM Take 1 capsule (2 mg total) by mouth as needed for diarrhea or loose stools.   Magnesium 400 MG Caps Take 400 mg by mouth every other day.   metoprolol succinate 25 MG 24 hr tablet Commonly known as: TOPROL-XL Take 1 tablet by mouth daily.   Neurontin 300 MG capsule Generic drug: gabapentin Take 300 mg by mouth at bedtime.   OVER THE COUNTER MEDICATION Take 6 tablets by mouth daily as needed (constipation). Tam herbal laxative   pantoprazole 40 MG tablet Commonly known as: PROTONIX Take 40 mg by mouth See admin instructions. Take 40 mg daily may take a second 40 mg dose as needed for heartburn   potassium chloride 10 MEQ tablet Commonly known as: KLOR-CON M Take 10 mEq by mouth daily.   Probiotic Caps Take 1 capsule by mouth every other day.   REFRESH DRY EYE THERAPY OP Place 1 drop into both eyes daily as needed (dry eyes).   rosuvastatin 5 MG tablet Commonly known as: CRESTOR Take 5 mg by mouth daily.   SALONPAS PAIN RELIEF PATCH EX Apply 1-3 patches topically daily as needed (pain).   thiamine 100 MG tablet Commonly known as: Vitamin B-1 Take 1 tablet (100 mg total) by mouth daily.   torsemide 20 MG tablet Commonly known as: DEMADEX Take 1 pill as needed for weight gain more than 2.5 pounds above baseline or lower extremity edema. What changed:  how much to take how to take this when to take this additional instructions   Trelegy Ellipta 100-62.5-25 MCG/ACT Aepb Generic drug: Fluticasone-Umeclidin-Vilant Inhale 1 puff into the lungs daily.   trolamine salicylate 10 % cream Commonly known as: ASPERCREME Apply 1 application topically as needed for muscle pain.   Turmeric Curcumin 500 MG Caps Take 500 mg by mouth every other  day.   valACYclovir 1000 MG tablet Commonly known as: VALTREX Take 500-1,000 mg by mouth See admin instructions. Take 500 mg daily, may take 1000 mg instead if currently breaking out   vitamin E 180 MG (400 UNITS) capsule Take 400  Units by mouth every other day.   Zinc 50 MG Caps Take 50 mg by mouth every other day.         Contact information for follow-up providers     Benita Stabile, MD. Schedule an appointment as soon as possible for a visit in 1 week(s).   Specialty: Internal Medicine Contact information: [address] [place] [address] [phone]              Contact information for after-discharge care     Destination     [facility] .   Service: Skilled Nursing Contact information: [address] [place] [address] [phone]                     Major procedures and Radiology Reports - PLEASE review detailed and final reports thoroughly  -     DG Swallowing Func-Speech Pathology  Result Date: 08/06/2023 Table formatting from the original result was not included. Modified Barium Swallow Study Patient Details Name: Carol Rowe MRN: [id number] Date of Birth: 18-Dec-1955 Today's Date: 08/06/2023 HPI/PMH: HPI: 67 yo female presents to ED on 10/18 found down s/p fall at home with hypoglycemia and hypotension. + head trauma but CTH negative for acute findings. Workup for shock. Pt admitted to ICU 10/19 for pressor support, intubation, ETT 10/19-10/22, CRRT 10/19-10/22. EEG 10/23 suggestive of moderate diffuse encephalopathy. Pt also with mild rhabdo. Hospitalization further complicated by encephalopathy and ileus. PMH includes ETOH abuse, cirrhosis, HTN, COPD, smoker, gout. Clinical Impression: Clinical Impression: Pt has a cognitively based oral dysphagia wtih relatively functional pharyngeal function. She has oral holding that fluctuates in duration, but does often require cues from  SLP to swallow. When she initiates posterior transit, her lingual motion is swift. There is oral residue across consistencies that does spill into her pharynx after the swallow, but there is no additional residue originating in the pharynx as her pharyngeal strength is good. She has intermittent, trace penetration with thin and nectar thick liquids (PAS 2) due to impaired timing, but her laryngeal vestibule closure is effective enough to clear penetrates as she swallows. No aspiration is observed. Discussed with RN, who was present for the study: suspect that her mentation may be her biggest limiting factor when it comes to oral nutrition. Recomemnd starting with Dys 2 (minced) diet and thin liquids to facilitate oral preparation. If nursing finds that this is still too effortful and prolonged at meal times, could downgrade to purees pending SLP f/u. Per MD, given concern for ileus this admission, will start with CLD for today (thin liquids) and advance to Dys 2 within the next day or so if tolerating. Factors that may increase risk of adverse event in presence of aspiration [name] & Clearance Coots 2021): Factors that may increase risk of adverse event in presence of aspiration [name] & Clearance Coots 2021): Reduced cognitive function; Limited mobility Recommendations/Plan: Swallowing Evaluation Recommendations Swallowing Evaluation Recommendations Recommendations: PO diet PO Diet Recommendation: Dysphagia 2 (Finely chopped); Thin liquids (Level 0) Liquid Administration via: Cup; Straw Medication Administration: Whole meds with liquid (can consider purees PRN if oral holdingi s observed clinically) Supervision: Staff to assist with self-feeding; Full assist for feeding Swallowing strategies  : Minimize environmental distractions; Slow rate; Small bites/sips Postural changes: Position pt fully upright for meals Oral care recommendations: Oral care BID (2x/day) Treatment Plan Treatment Plan Treatment recommendations: Therapy as  outlined in treatment plan below Follow-up recommendations: Skilled nursing-short  term rehab (<3 hours/day) Functional status assessment: Patient has had a recent decline in their functional status and demonstrates the ability to make significant improvements in function in a reasonable and predictable amount of time. Treatment frequency: Min 2x/week Treatment duration: 2 weeks Interventions: Aspiration precaution training; Compensatory techniques; Patient/family education; Trials of upgraded texture/liquids; Diet toleration management by SLP Recommendations Recommendations for follow up therapy are one component of a multi-disciplinary discharge planning process, led by the attending physician.  Recommendations may be updated based on patient status, additional functional criteria and insurance authorization. Assessment: Orofacial Exam: Orofacial Exam Oral Cavity - Dentition: Adequate natural dentition Anatomy: Anatomy: Suspected cervical osteophytes Boluses Administered: Boluses Administered Boluses Administered: Thin liquids (Level 0); Mildly thick liquids (Level 2, nectar thick); Moderately thick liquids (Level 3, honey thick); Puree; Solid  Oral Impairment Domain: Oral Impairment Domain Lip Closure: No labial escape Tongue control during bolus hold: Posterior escape of greater than half of bolus (cognitively does not hold boluses despite repeated cues to do so) Bolus preparation/mastication: Slow prolonged chewing/mashing with complete recollection Bolus transport/lingual motion: Delayed initiation of tongue motion (oral holding) Oral residue: Residue collection on oral structures Location of oral residue : Tongue Initiation of pharyngeal swallow : Pyriform sinuses  Pharyngeal Impairment Domain: Pharyngeal Impairment Domain Soft palate elevation: No bolus between soft palate (SP)/pharyngeal wall (PW) Laryngeal elevation: Complete superior movement of thyroid cartilage with complete approximation of arytenoids  to epiglottic petiole Anterior hyoid excursion: Complete anterior movement Epiglottic movement: Complete inversion Laryngeal vestibule closure: Complete, no air/contrast in laryngeal vestibule Pharyngeal stripping wave : Present - complete Pharyngeal contraction (A/P view only): N/A Pharyngoesophageal segment opening: Partial distention/partial duration, partial obstruction of flow Tongue base retraction: No contrast between tongue base and posterior pharyngeal wall (PPW) Pharyngeal residue: Complete pharyngeal clearance Location of pharyngeal residue: N/A  Esophageal Impairment Domain: Esophageal Impairment Domain Esophageal clearance upright position: Esophageal retention Pill: Pill Consistency administered: Thin liquids (Level 0) Thin liquids (Level 0): [facility] Penetration/Aspiration Scale Score: Penetration/Aspiration Scale Score 1.  Material does not enter airway: Moderately thick liquids (Level 3, honey thick); Puree; Solid; Pill 2.  Material enters airway, remains ABOVE vocal cords then ejected out: Thin liquids (Level 0); Mildly thick liquids (Level 2, nectar thick) Compensatory Strategies: No data recorded  General Information: Caregiver present: Yes (RN)  Diet Prior to this Study: NPO (pt pulled NGT)   Temperature : Normal   Respiratory Status: WFL   Supplemental O2: None (Room air)   History of Recent Intubation: Yes  Behavior/Cognition: Alert; Cooperative; Pleasant mood; Requires cueing; Impulsive Self-Feeding Abilities: Able to self-feed Baseline vocal quality/speech: Normal No data recorded Volitional Swallow: Able to elicit (with delays) Exam Limitations: No limitations Goal Planning: Prognosis for improved oropharyngeal function: Good Barriers to Reach Goals: Cognitive deficits No data recorded Patient/Family Stated Goal: To have some water/drink/ Consulted and agree with results and recommendations: Patient; Nurse; Physician Pain: Pain Assessment Pain Assessment: Faces Faces Pain Scale: 0 End of  Session: Start Time:SLP Start Time (ACUTE ONLY): 0825 Stop Time: SLP Stop Time (ACUTE ONLY): 0839 Time Calculation:SLP Time Calculation (min) (ACUTE ONLY): 14 min Charges: SLP Evaluations $ SLP Speech Visit: 1 Visit SLP Evaluations $BSS Swallow: 1 Procedure $MBS Swallow: 1 Procedure SLP visit diagnosis: SLP Visit Diagnosis: Dysphagia, oral phase (R13.11) Past Medical History: Past Medical History: Diagnosis Date  Alcoholic (HCC)   Arthritis   Hypertension   VIN III (vulvar intraepithelial neoplasia III)   Vulvar lesion  Past Surgical History: Past Surgical History: Procedure  Laterality Date  ABDOMINAL HYSTERECTOMY  1989  CESAREAN SECTION    CHOLECYSTECTOMY    COLONOSCOPY N/A 12/10/2019  Procedure: COLONOSCOPY;  Surgeon: [name], MD;  Location: [facility];  Service: Endoscopy;  Laterality: N/A;  730  POLYPECTOMY  12/10/2019  Procedure: POLYPECTOMY;  Surgeon: [name], MD;  Location: [facility];  Service: Endoscopy;;  skin cancer removal   Mahala Menghini., M.A. CCC-SLP Acute Rehabilitation Services Office [phone] Secure chat preferred 08/06/2023, 10:12 AM  DG CHEST PORT 1 VIEW  Result Date: 08/04/2023 CLINICAL DATA:  Hypoxia EXAM: PORTABLE CHEST 1 VIEW COMPARISON:  07/31/2023 FINDINGS: Single frontal view of the chest demonstrates enteric catheter passing below diaphragm tip excluded by collimation. Right internal jugular catheter tip overlies the superior vena cava. Endotracheal tube is no longer visualized. Cardiac silhouette is unremarkable. Continued areas of patchy consolidation at the lung bases, favoring atelectasis and/or scarring. Trace right pleural effusion. No pneumothorax. No acute bony abnormality. Prior healed left rib fractures. IMPRESSION: 1. Patchy bibasilar consolidation consistent with atelectasis and/or scarring. No acute airspace disease. 2. Trace right pleural effusion. 3. Support devices as above. Endotracheal tube is no longer visualized, please correlate with history of  extubation. Electronically Signed   By: Sharlet Salina M.D.   On: 08/04/2023 17:32   DG Abd Portable 1V  Result Date: 08/03/2023 CLINICAL DATA:  Ileus EXAM: PORTABLE ABDOMEN - 1 VIEW COMPARISON:  08/01/2023 CT and plain films. FINDINGS: NG tube is in the stomach with the tip in the distal stomach. Right groin dialysis catheter tip in the lower abdomen/upper pelvis. Continued prominent small bowel loops in the left abdomen, similar to prior study. No free air or organomegaly. Prior cholecystectomy. IMPRESSION: Continued mildly dilated left lower abdominal small bowel loops, not significantly changed since prior study. Electronically Signed   By: Charlett Nose M.D.   On: 08/03/2023 15:20   EEG adult  Result Date: 08/01/2023 Charlsie Quest, MD     08/01/2023  5:36 PM Patient Name: Carol Rowe MRN: [id number] Epilepsy Attending: Charlsie Quest Referring Physician/Provider: Josephine Igo, DO Date: 07/30/2023 Duration: 22.45 mins Patient history: 67yo F with ams getting eeg to evaluate for seizure Level of alertness: Awake AEDs during EEG study: None Technical aspects: This EEG study was done with scalp electrodes positioned according to the 10-20 International system of electrode placement. Electrical activity was reviewed with band pass filter of 1-70Hz , sensitivity of 7 uV/mm, display speed of 8mm/sec with a 60Hz  notched filter applied as appropriate. EEG data were recorded continuously and digitally stored.  Video monitoring was available and reviewed as appropriate. Description: EEG showed continuous generalized 3 to 6 Hz theta-delta slowing, at times with triphasic morphology. Hyperventilation and photic stimulation were not performed.   ABNORMALITY - Continuous slow, generalized IMPRESSION: This study is suggestive of moderate diffuse encephalopathy. No seizures or epileptiform discharges were seen throughout the recording. Charlsie Quest   DG Abd 1 View  Result Date: 08/01/2023 CLINICAL  DATA:  Nasogastric tube placement. EXAM: ABDOMEN - 1 VIEW COMPARISON:  CT earlier today FINDINGS: Tip and side port of the enteric tube below the diaphragm in the stomach. Dilated loop of small bowel in the left abdomen as seen on CT. IMPRESSION: Tip and side port of the enteric tube below the diaphragm in the stomach. Electronically Signed   By: Narda Rutherford M.D.   On: 08/01/2023 14:48   CT ABDOMEN PELVIS WO CONTRAST  Result Date: 08/01/2023 CLINICAL DATA:  Bowel obstruction suspected EXAM: CT ABDOMEN AND PELVIS WITHOUT CONTRAST TECHNIQUE: Multidetector CT imaging of the abdomen and pelvis was performed following the standard protocol without IV contrast. RADIATION DOSE REDUCTION: This exam was performed according to the departmental dose-optimization program which includes automated exposure control, adjustment of the mA and/or kV according to patient size and/or use of iterative reconstruction technique. COMPARISON:  07/28/2023 FINDINGS: Lower chest: Small bilateral pleural effusions and associated atelectasis or consolidation, increased compared to prior examination. Three-vessel coronary artery calcifications. Hepatobiliary: No focal liver abnormality is seen. Hepatic steatosis. Status post cholecystectomy. No biliary dilatation. Pancreas: Unremarkable. No pancreatic ductal dilatation or surrounding inflammatory changes. Spleen: Normal in size without significant abnormality. Adrenals/Urinary Tract: Adrenal glands are unremarkable. Kidneys are normal, without renal calculi, solid lesion, or hydronephrosis. Foley catheter in the urinary bladder. Stomach/Bowel: Esophagogastric tube with tip and side port below the diaphragm, tip in the vicinity of the pylorus. Fluid-filled, mildly distended loops of small bowel throughout the central abdomen, measuring up to 4.2 cm in the central abdomen (series 3, image 61). There is some mildly tethered appearing and narrowed loops in the right hemiabdomen (series 3,  image 67), however there is no clear transition point, and there is a significant volume of gas and fluid present in the proximal colon, the distal colon remaining fairly decompressed. Stomach is otherwise within normal limits. Appendix appears normal. Sigmoid diverticulosis. Vascular/Lymphatic: Severe aortic atherosclerosis. No enlarged abdominal or pelvic lymph nodes. Reproductive: Status post hysterectomy. Other: No abdominal wall hernia or abnormality. No ascites. Musculoskeletal: No acute or significant osseous findings. IMPRESSION: 1. Fluid-filled, mildly distended loops of small bowel throughout the central abdomen, measuring up to 4.2 cm in the central abdomen. There are some mildly tethered appearing and narrowed loops in the right hemiabdomen, however there is no clear transition point, and there is a significant volume of gas and fluid present in the proximal colon. Findings are most consistent with ileus, partial or developing distal small bowel obstruction not excluded. 2. Small bilateral pleural effusions and associated atelectasis or consolidation, increased compared to prior examination. 3. Hepatic steatosis. 4. Coronary artery disease. Aortic Atherosclerosis (ICD10-I70.0). Electronically Signed   By: Jearld Lesch M.D.   On: 08/01/2023 13:18   CT HEAD WO CONTRAST ( )  Result Date: 08/01/2023 CLINICAL DATA:  Mental status change, persistent or worsening. EXAM: CT HEAD WITHOUT CONTRAST TECHNIQUE: Contiguous axial images were obtained from the base of the skull through the vertex without intravenous contrast. RADIATION DOSE REDUCTION: This exam was performed according to the departmental dose-optimization program which includes automated exposure control, adjustment of the mA and/or kV according to patient size and/or use of iterative reconstruction technique. COMPARISON:  None Available. FINDINGS: Brain: No evidence of acute infarction, hemorrhage, hydrocephalus, extra-axial collection or mass  lesion/mass effect. Remote right cerebellar infarct. Confluent hypodensity of the periventricular white matter, nonspecific, most likely related to chronic microangiopathy. Mild parenchymal volume loss. Vascular: Calcified plaques in the bilateral carotid siphons. Skull: Normal. Negative for fracture or focal lesion. Sinuses/Orbits: No acute finding. Other: None. IMPRESSION: 1. No acute intracranial abnormality. 2. Remote right cerebellar infarct. 3. Chronic microangiopathy. Electronically Signed   By: Baldemar Lenis M.D.   On: 08/01/2023 12:11   DG CHEST PORT 1 VIEW  Result Date: 07/31/2023 CLINICAL DATA:  Hypoxia EXAM: PORTABLE CHEST 1 VIEW COMPARISON:  07/28/2023 FINDINGS: No significant change in AP portable chest radiograph. Support apparatus including endotracheal tube, esophagogastric tube, and right neck vascular catheter unchanged. Heart size normal.  Unchanged layering pleural effusions and associated atelectasis or consolidation. No new airspace opacity. No acute osseous findings. IMPRESSION: 1. No significant change in AP portable chest radiograph. 2. Unchanged layering pleural effusions and associated atelectasis or consolidation. No new airspace opacity. 3. Support apparatus unchanged. Electronically Signed   By: Jearld Lesch M.D.   On: 07/31/2023 11:42   DG CHEST PORT 1 VIEW  Result Date: 07/29/2023 CLINICAL DATA:  Hypoxia.  Hypoglycemia.  Hypotension.  Fall. EXAM: PORTABLE CHEST 1 VIEW COMPARISON:  07/28/2023 FINDINGS: Endotracheal tube, enteric tube, and right central venous catheter are unchanged in position. Linear atelectasis or consolidation demonstrated in both lung bases, progressing since prior study. Emphysematous changes and scattered fibrosis in the lungs. No pneumothorax. Thoracolumbar scoliosis convex towards the right. IMPRESSION: Appliances remain unchanged in satisfactory position. Infiltration or atelectasis in the lung bases with progression since prior  study. Electronically Signed   By: Burman Nieves M.D.   On: 07/29/2023 00:40   ECHOCARDIOGRAM COMPLETE  Result Date: 07/28/2023    ECHOCARDIOGRAM REPORT   Patient Name:   Carol Rowe Date of Exam: 07/28/2023 Medical Rec #:  [id number]       Height:       63.0 in Accession #:    [id number]      Weight:       133.8 lb Date of Birth:  1956-04-21        BSA:          1.630 m Patient Age:    67 years        BP:           100/69 mmHg Patient Gender: F               HR:           87 bpm. Exam Location:  Inpatient Procedure: 2D Echo, Cardiac Doppler and Color Doppler Indications:    Shock  History:        Patient has no prior history of Echocardiogram examinations.                 Risk Factors:Hypertension. ETOH, alcoholic cirrhosis.  Sonographer:    Milda Smart Referring Phys: [id number] JEAN-PIERRE ASSAKER  Sonographer Comments: Echo performed with patient supine and on artificial respirator. IMPRESSIONS  1. Left ventricular ejection fraction, by estimation, is 50 to 55%. The left ventricle has low normal function. Left ventricular endocardial border not optimally defined to evaluate regional wall motion. Left ventricular diastolic parameters are consistent with Grade I diastolic dysfunction (impaired relaxation).  2. Right ventricular systolic function is low normal. The right ventricular size is normal.  3. The mitral valve is normal in structure. Trivial mitral valve regurgitation. No evidence of mitral stenosis.  4. The tricuspid valve is abnormal. Tricuspid valve regurgitation is mild to moderate.  5. The aortic valve was not well visualized. Aortic valve regurgitation is not visualized. No aortic stenosis is present. Comparison(s): No prior Echocardiogram. FINDINGS  Left Ventricle: Left ventricular ejection fraction, by estimation, is 50 to 55%. The left ventricle has low normal function. Left ventricular endocardial border not optimally defined to evaluate regional wall motion. The left ventricular  internal cavity  size was normal in size. There is no left ventricular hypertrophy. Left ventricular diastolic parameters are consistent with Grade I diastolic dysfunction (impaired relaxation). Normal left ventricular filling pressure. Right Ventricle: The right ventricular size is normal. No increase in right ventricular wall thickness. Right ventricular systolic function is low normal. Left Atrium:  Left atrial size was normal in size. Right Atrium: Right atrial size was normal in size. Pericardium: There is no evidence of pericardial effusion. Mitral Valve: The mitral valve is normal in structure. Trivial mitral valve regurgitation. No evidence of mitral valve stenosis. MV peak gradient, 7.7 mmHg. The mean mitral valve gradient is 3.0 mmHg. Tricuspid Valve: The tricuspid valve is abnormal. Tricuspid valve regurgitation is mild to moderate. No evidence of tricuspid stenosis. Aortic Valve: The aortic valve was not well visualized. Aortic valve regurgitation is not visualized. No aortic stenosis is present. Pulmonic Valve: The pulmonic valve was not well visualized. Pulmonic valve regurgitation is not visualized. No evidence of pulmonic stenosis. Aorta: The aortic root and ascending aorta are structurally normal, with no evidence of dilitation. Venous: IVC assessment for right atrial pressure unable to be performed due to mechanical ventilation. IAS/Shunts: No atrial level shunt detected by color flow Doppler.  LEFT VENTRICLE PLAX 2D LVIDd:         4.10 cm     Diastology LVIDs:         3.10 cm     LV e' medial:    5.98 cm/s LV PW:         0.80 cm     LV E/e' medial:  13.7 LV IVS:        0.80 cm     LV e' lateral:   6.85 cm/s LVOT diam:     2.10 cm     LV E/e' lateral: 11.9 LV SV:         51 LV SV Index:   31 LVOT Area:     3.46 cm  LV Volumes (MOD) LV vol d, MOD A2C: 35.8 ml LV vol d, MOD A4C: 70.3 ml LV vol s, MOD A2C: 18.2 ml LV vol s, MOD A4C: 33.0 ml LV SV MOD A2C:     17.6 ml LV SV MOD A4C:     70.3 ml LV SV  MOD BP:      27.4 ml RIGHT VENTRICLE            IVC RV Basal diam:  3.20 cm    IVC diam: 1.20 cm RV S prime:     9.68 cm/s TAPSE (M-mode): 1.8 cm LEFT ATRIUM             Index        RIGHT ATRIUM           Index LA diam:        2.90 cm 1.78 cm/m   RA Area:     10.90 cm LA Vol (A2C):   22.4 ml 13.74 ml/m  RA Volume:   22.40 ml  13.74 ml/m LA Vol (A4C):   34.5 ml 21.16 ml/m LA Biplane Vol: 32.4 ml 19.88 ml/m  AORTIC VALVE LVOT Vmax:   85.70 cm/s LVOT Vmean:  62.900 cm/s LVOT VTI:    0.146 m  AORTA Ao Root diam: 2.70 cm Ao Asc diam:  3.10 cm MITRAL VALVE                TRICUSPID VALVE MV Area (PHT): 2.71 cm     TR Peak grad:   33.2 mmHg MV Area VTI:   1.72 cm     TR Mean grad:   20.0 mmHg MV Peak grad:  7.7 mmHg     TR Vmax:        288.00 cm/s MV Mean grad:  3.0 mmHg     TR Vmean:  214.0 cm/s MV Vmax:       1.39 m/s MV Vmean:      87.4 cm/s    SHUNTS MV Decel Time: 280 msec     Systemic VTI:  0.15 m MR Peak grad: 38.9 mmHg     Systemic Diam: 2.10 cm MR Vmax:      312.00 cm/s MV E velocity: 81.80 cm/s MV A velocity: 110.00 cm/s MV E/A ratio:  0.74 Vishnu Priya Mallipeddi Electronically signed by Winfield Rast Mallipeddi Signature Date/Time: 07/28/2023/2:56:39 PM    Final    DG Abd 1 View  Result Date: 07/28/2023 CLINICAL DATA:  Orogastric tube placement.  Tube was advanced. EXAM: ABDOMEN - 1 VIEW COMPARISON:  Earlier today FINDINGS: Advancement of the enteric tube, the tip and side port are now below the diaphragm in the stomach. Nonobstructed upper abdominal bowel gas pattern. IMPRESSION: Advancement of the enteric tube with tip and side port now below the diaphragm in the stomach. Electronically Signed   By: Narda Rutherford M.D.   On: 07/28/2023 12:14   DG Abd 1 View  Result Date: 07/28/2023 CLINICAL DATA:  Intubation EXAM: ABDOMEN - 1 VIEW COMPARISON:  Abdominal CT from earlier the same day FINDINGS: Enteric tube with tip at the stomach and side port at the GE junction. No gas dilated bowel.  Femoral line on the right with tip near the right iliac and IVC junction. Cholecystectomy clips. IMPRESSION: Enteric tube with tip at the proximal stomach and side port near the GE junction, consider 5 to 10 cm of advancement. Unremarkable right femoral line. Electronically Signed   By: Tiburcio Pea M.D.   On: 07/28/2023 08:04   DG CHEST PORT 1 VIEW  Result Date: 07/28/2023 CLINICAL DATA:  Intubation EXAM: PORTABLE CHEST 1 VIEW COMPARISON:  07/28/2023 FINDINGS: Endotracheal tube with tip just below the clavicular heads. An enteric tube tip reaches the stomach with side port near the GE junction. Right IJ line with tip at the upper cavoatrial junction. Low volume chest with streaky and hazy density at the bases. No edema, effusion, or pneumothorax. Normal heart size and mediastinal contours. IMPRESSION: 1. Unremarkable endotracheal tube positioning. 2. Enteric tube with tip at the stomach and side port near the GE junction, consider few cm of advancement. 3. Atelectatic type density at the bases, mildly progressed. Electronically Signed   By: Tiburcio Pea M.D.   On: 07/28/2023 08:03   CT ABDOMEN PELVIS WO CONTRAST  Result Date: 07/28/2023 CLINICAL DATA:  67 year old female with history of sepsis. EXAM: CT ABDOMEN AND PELVIS WITHOUT CONTRAST TECHNIQUE: Multidetector CT imaging of the abdomen and pelvis was performed following the standard protocol without IV contrast. RADIATION DOSE REDUCTION: This exam was performed according to the departmental dose-optimization program which includes automated exposure control, adjustment of the mA and/or kV according to patient size and/or use of iterative reconstruction technique. COMPARISON:  CT of the abdomen and pelvis 02/25/2009. FINDINGS: Lower chest: Atherosclerotic calcifications are noted in the thoracic aorta as well as the left main, left anterior descending, left circumflex and right coronary arteries. Calcifications of the aortic valve. Central venous  catheter terminating in the right atrium. Scattered areas of scarring and/or atelectasis in the lung bases bilaterally. Trace bilateral pleural effusions. Hepatobiliary: Liver has a shrunken appearance and nodular contour, indicative of underlying cirrhosis. There is also severe diffuse low attenuation throughout the hepatic parenchyma, indicative of a background of hepatic steatosis. No discrete cystic or solid hepatic lesions are confidently identified on today's noncontrast  CT examination. Status post cholecystectomy. Pancreas: No definite pancreatic mass identified on today's noncontrast CT examination. Soft tissue stranding surrounding the pancreas could indicate acute pancreatitis. No well organized peripancreatic fluid collection noted to clearly indicate the presence of a pseudocyst. Spleen: Portable. Adrenals/Urinary Tract: Unenhanced appearance of the kidneys is unremarkable. No hydroureteronephrosis. Bilateral adrenal glands are normal in appearance. Urinary bladder is nearly completely decompressed around an indwelling Foley balloon catheter. Stomach/Bowel: The appearance of the stomach is normal. No pathologic dilatation of small bowel or colon. Numerous colonic diverticuli are noted, without surrounding inflammatory changes to suggest an acute diverticulitis at this time. Normal appendix. Vascular/Lymphatic: Atherosclerotic calcifications are noted in the abdominal aorta and pelvic vasculature. No lymphadenopathy noted in the abdomen or pelvis. Reproductive: Status post hysterectomy. Right ovary is not confidently identified may be surgically absent or atrophic. Left ovary is unremarkable in appearance. Other: Trace volume of ascites.  No pneumoperitoneum. Musculoskeletal: There are no aggressive appearing lytic or blastic lesions noted in the visualized portions of the skeleton. IMPRESSION: 1. Soft tissue stranding surrounding the pancreas which may suggest acute pancreatitis. Correlation with lipase  levels and clinical history is recommended. 2. Severe hepatic steatosis with morphologic changes in the liver indicative of underlying cirrhosis. 3. Trace volume of ascites and trace bilateral pleural effusions. 4. Colonic diverticulosis without evidence of acute diverticulitis at this time. 5. Aortic atherosclerosis, in addition to left main and three-vessel coronary artery disease. Please note that although the presence of coronary artery calcium documents the presence of coronary artery disease, the severity of this disease and any potential stenosis cannot be assessed on this non-gated CT examination. Assessment for potential risk factor modification, dietary therapy or pharmacologic therapy may be warranted, if clinically indicated. 6. Additional incidental findings, as above. Electronically Signed   By: Trudie Reed M.D.   On: 07/28/2023 06:27   DG CHEST PORT 1 VIEW  Result Date: 07/28/2023 CLINICAL DATA:  Central line placement EXAM: PORTABLE CHEST 1 VIEW COMPARISON:  07/27/2023 FINDINGS: Right central venous catheter with tip projecting over the cavoatrial junction. No pneumothorax. Shallow inspiration with linear atelectasis in the lung bases. No pleural effusions. No pneumothorax. Normal heart size. Calcification of the aorta. Thoracolumbar scoliosis. IMPRESSION: Right central venous catheter tip projects over the cavoatrial junction region. Shallow inspiration with linear atelectasis in the lung bases. Electronically Signed   By: Burman Nieves M.D.   On: 07/28/2023 02:51   DG Chest Port 1 View  Result Date: 07/27/2023 CLINICAL DATA:  sick.  Central line placement EXAM: PORTABLE CHEST 1 VIEW COMPARISON:  None Available. FINDINGS: Right internal jugular central venous catheter tip overlying the right atrium. The heart and mediastinal contours are within normal limits. Atherosclerotic plaque. Bibasilar atelectasis. No focal consolidation. No pulmonary edema. No pleural effusion. No  pneumothorax. No acute osseous abnormality.  Old healed left rib fractures. IMPRESSION: Right internal jugular central venous catheter tip overlying the right atrium. Recommend retraction by 2 cm. Electronically Signed   By: Tish Frederickson M.D.   On: 07/27/2023 23:39   CT Cervical Spine Wo Contrast  Result Date: 07/27/2023 CLINICAL DATA:  Fall EXAM: CT CERVICAL SPINE WITHOUT CONTRAST TECHNIQUE: Multidetector CT imaging of the cervical spine was performed without intravenous contrast. Multiplanar CT image reconstructions were also generated. RADIATION DOSE REDUCTION: This exam was performed according to the departmental dose-optimization program which includes automated exposure control, adjustment of the mA and/or kV according to patient size and/or use of iterative reconstruction technique. COMPARISON:  None Available.  FINDINGS: Alignment: No traumatic listhesis.  Trace anterolisthesis C4 on C5. Skull base and vertebrae: No acute fracture. No primary bone lesion or focal pathologic process. Soft tissues and spinal canal: No prevertebral fluid or swelling. No visible canal hematoma. Left thyroid nodule measures up to 2.9 cm. Disc levels: Degenerative changes in the cervical spine. No high-grade spinal canal stenosis. Upper chest: No focal pulmonary opacity or pleural effusion. Emphysema. IMPRESSION: 1. No acute cervical spine fracture or traumatic listhesis. 2. Left thyroid nodule measuring up to 2.9 cm. If this has not previously been evaluated, a non-emergent ultrasound of the thyroid is recommended. (Reference: J Am Coll Radiol. 2015 Feb;12(2): 143-50) 3. Emphysema. Emphysema (ICD10-J43.9). Electronically Signed   By: Wiliam Ke M.D.   On: 07/27/2023 21:58   CT HEAD CODE STROKE WO CONTRAST  Result Date: 07/27/2023 CLINICAL DATA:  Code stroke. Hypoglycemia, neuro deficit, weak for several days EXAM: CT HEAD WITHOUT CONTRAST TECHNIQUE: Contiguous axial images were obtained from the base of the skull  through the vertex without intravenous contrast. RADIATION DOSE REDUCTION: This exam was performed according to the departmental dose-optimization program which includes automated exposure control, adjustment of the mA and/or kV according to patient size and/or use of iterative reconstruction technique. COMPARISON:  None Available. FINDINGS: Brain: No evidence of acute infarction, hemorrhage, mass, mass effect, or midline shift. No hydrocephalus or extra-axial collection. Periventricular white matter changes, likely the sequela of chronic small vessel ischemic disease. Vascular: No hyperdense vessel. Skull: Negative for fracture or focal lesion. Sinuses/Orbits: No acute finding. Other: The mastoid air cells are well aerated. ASPECTS [facility] Stroke Program Early CT Score) - Ganglionic level infarction (caudate, lentiform nuclei, internal capsule, insula, M1-M3 cortex): 7 - Supraganglionic infarction (M4-M6 cortex): 3 Total score (0-10 with 10 being normal): 10 IMPRESSION: 1. No acute intracranial process. 2. ASPECTS is 10. Code stroke imaging results were communicated on 07/27/2023 at 9:55 pm to provider Hyacinth Meeker via telephone, who verbally acknowledged these results. Electronically Signed   By: Wiliam Ke M.D.   On: 07/27/2023 21:55    Micro Results   Recent Results (from the past 240 hour(s))  Gastrointestinal Panel by PCR , Stool     Status: None   Collection Time: 08/03/23  3:36 PM   Specimen: Stool  Result Value Ref Range Status   Campylobacter species NOT DETECTED NOT DETECTED Final   Plesimonas shigelloides NOT DETECTED NOT DETECTED Final   Salmonella species NOT DETECTED NOT DETECTED Final   Yersinia enterocolitica NOT DETECTED NOT DETECTED Final   Vibrio species NOT DETECTED NOT DETECTED Final   Vibrio cholerae NOT DETECTED NOT DETECTED Final   Enteroaggregative E coli (EAEC) NOT DETECTED NOT DETECTED Final   Enteropathogenic E coli (EPEC) NOT DETECTED NOT DETECTED Final   Enterotoxigenic  E coli (ETEC) NOT DETECTED NOT DETECTED Final   Shiga like toxin producing E coli (STEC) NOT DETECTED NOT DETECTED Final   Shigella/Enteroinvasive E coli (EIEC) NOT DETECTED NOT DETECTED Final   Cryptosporidium NOT DETECTED NOT DETECTED Final   Cyclospora cayetanensis NOT DETECTED NOT DETECTED Final   Entamoeba histolytica NOT DETECTED NOT DETECTED Final   Giardia lamblia NOT DETECTED NOT DETECTED Final   Adenovirus F40/41 NOT DETECTED NOT DETECTED Final   Astrovirus NOT DETECTED NOT DETECTED Final   Norovirus GI/GII NOT DETECTED NOT DETECTED Final   Rotavirus A NOT DETECTED NOT DETECTED Final   Sapovirus (I, II, IV, and V) NOT DETECTED NOT DETECTED Final    Comment: Performed at [facility]  [facility], [address], [place], [place] [address]  C Difficile Quick Screen (NO PCR Reflex)     Status: None   Collection Time: 08/03/23  3:36 PM   Specimen: STOOL  Result Value Ref Range Status   C Diff antigen NEGATIVE NEGATIVE Final   C Diff toxin NEGATIVE NEGATIVE Final   C Diff interpretation No C. difficile detected.  Final    Comment: Performed at [facility], [address] [place], [place] [address]  Culture, blood (Routine X 2) w Reflex to ID Panel     Status: None   Collection Time: 08/03/23  7:27 PM   Specimen: BLOOD  Result Value Ref Range Status   Specimen Description BLOOD SITE NOT SPECIFIED  Final   Special Requests   Final    BOTTLES DRAWN AEROBIC AND ANAEROBIC Blood Culture adequate volume   Culture   Final    NO GROWTH 5 DAYS Performed at [facility], [address], [place], [place] [address]    Report Status 08/08/2023 FINAL  Final  Culture, blood (Routine X 2) w Reflex to ID Panel     Status: None   Collection Time: 08/03/23  7:27 PM   Specimen: BLOOD  Result Value Ref Range Status   Specimen Description BLOOD SITE NOT SPECIFIED  Final   Special Requests   Final    BOTTLES DRAWN AEROBIC AND ANAEROBIC Blood Culture results may not be optimal due to  an excessive volume of blood received in culture bottles   Culture   Final    NO GROWTH 5 DAYS Performed at [facility], [address], [place], [place] [address]    Report Status 08/08/2023 FINAL  Final    Today   Subjective    Tinleigh Pfeffer today has no headache,no chest abdominal pain,no new weakness tingling or numbness, feels much better wants to go home today.    Objective   Blood pressure 102/74, pulse 94, temperature 98.8 F (37.1 C), temperature source Oral, resp. rate 16, height 5\' 3"  (1.6 m), weight 56.1 kg, SpO2 100%.   Intake/Output Summary (Last 24 hours) at 08/11/2023 0854 Last data filed at 08/10/2023 1200 Gross per 24 hour  Intake 480 ml  Output --  Net 480 ml    Exam  Awake Alert, No new F.N deficits,    [place].AT,PERRAL Supple Neck,   Symmetrical Chest wall movement, Good air movement bilaterally, CTAB RRR,No Gallops,   +ve B.Sounds, Abd Soft, Non tender,  No Cyanosis, Clubbing or edema    Data Review   Recent Labs  Lab 08/04/23 1559 08/05/23 0436 08/07/23 0317 08/09/23 0610 08/11/23 0432  WBC  --  9.9 12.3* 18.4* 12.4*  HGB 9.4* 9.8* 8.9* 9.4* 7.8*  HCT 28.7* 30.2* 27.5* 28.9* 24.3*  PLT  --  231 302 336 237  MCV  --  100.7* 100.0 99.3 100.0  MCH  --  32.7 32.4 32.3 32.1  MCHC  --  32.5 32.4 32.5 32.1  RDW  --  19.8* 19.0* 18.4* 18.6*  LYMPHSABS  --   --   --   --  0.7  MONOABS  --   --   --   --  0.5  EOSABS  --   --   --   --  0.2  BASOSABS  --   --   --   --  0.1    Recent Labs  Lab 08/04/23 0946 08/04/23 1559 08/04/23 1559 08/05/23 0436  08/06/23 0309 08/06/23 1241 08/06/23 1711 08/07/23 0317 08/08/23 0532 08/09/23 0610 08/10/23 0309 08/11/23 0432  NA  --  144   < > 142 146*  --    < > 138 138 137 140 137  K  --  4.3   < > 4.1 3.2*  --   --  2.8* 3.1* 3.5 3.5 3.5  CL  --  109   < > 110 112*  --   --  112* 113* 114* 117* 113*  CO2  --  21*   < > 17* 17*  --   --  16* 14* 15* 14* 18*  ANIONGAP  --  14   < > 15 17*   --   --  10 11 8 9 6   GLUCOSE  --  99   < > 90 106*  --   --  120* 89 83 93 106*  BUN  --  52*   < > 47* 36*  --   --  24* 18 13 13 9   CREATININE  --  2.53*   < > 2.46* 2.15*  --   --  1.97* 1.71* 1.63* 1.61* 1.35*  AST  --   --   --   --   --   --   --  21  --   --   --   --   ALT  --   --   --   --   --   --   --  28  --   --   --   --   ALKPHOS  --   --   --   --   --   --   --  157*  --   --   --   --   BILITOT  --   --   --   --   --   --   --  1.1  --   --   --   --   ALBUMIN  --  1.9*  --  1.8* 1.7*  --   --  1.6* <1.5*  --   --   --   PROCALCITON  --   --   --   --   --   --   --  2.47  --   --   --   --   INR 1.3*  --   --   --   --   --   --   --   --   --   --   --   AMMONIA  --   --   --   --   --  38*  --   --   --   --   --   --   MG  --   --   --  1.9 1.6*  --   --  1.7 2.1  --   --   --   CALCIUM  --  8.5*   < > 8.0* 8.3*  --   --  8.1* 8.0* 8.3* 7.7* 7.5*   < > = values in this interval not displayed.    Total Time in preparing paper work, data evaluation and todays exam - 35 minutes  Signature  -    Susa Raring M.D on 08/11/2023 at 8:54 AM   -  To page go to [email or web address]

## 2023-08-13 DIAGNOSIS — J449 Chronic obstructive pulmonary disease, unspecified: Secondary | ICD-10-CM | POA: Diagnosis not present

## 2023-08-13 DIAGNOSIS — F1090 Alcohol use, unspecified, uncomplicated: Secondary | ICD-10-CM | POA: Diagnosis not present

## 2023-08-13 DIAGNOSIS — D62 Acute posthemorrhagic anemia: Secondary | ICD-10-CM | POA: Diagnosis not present

## 2023-08-13 DIAGNOSIS — N179 Acute kidney failure, unspecified: Secondary | ICD-10-CM | POA: Diagnosis not present

## 2023-08-13 DIAGNOSIS — K922 Gastrointestinal hemorrhage, unspecified: Secondary | ICD-10-CM | POA: Diagnosis not present

## 2023-08-13 DIAGNOSIS — I10 Essential (primary) hypertension: Secondary | ICD-10-CM | POA: Diagnosis not present

## 2023-08-13 DIAGNOSIS — M6281 Muscle weakness (generalized): Secondary | ICD-10-CM | POA: Diagnosis not present

## 2023-08-13 DIAGNOSIS — D696 Thrombocytopenia, unspecified: Secondary | ICD-10-CM | POA: Diagnosis not present

## 2023-08-15 DIAGNOSIS — M6281 Muscle weakness (generalized): Secondary | ICD-10-CM | POA: Diagnosis not present

## 2023-08-15 DIAGNOSIS — F1090 Alcohol use, unspecified, uncomplicated: Secondary | ICD-10-CM | POA: Diagnosis not present

## 2023-08-15 DIAGNOSIS — J449 Chronic obstructive pulmonary disease, unspecified: Secondary | ICD-10-CM | POA: Diagnosis not present

## 2023-08-15 DIAGNOSIS — D696 Thrombocytopenia, unspecified: Secondary | ICD-10-CM | POA: Diagnosis not present

## 2023-08-15 DIAGNOSIS — K922 Gastrointestinal hemorrhage, unspecified: Secondary | ICD-10-CM | POA: Diagnosis not present

## 2023-08-15 DIAGNOSIS — I10 Essential (primary) hypertension: Secondary | ICD-10-CM | POA: Diagnosis not present

## 2023-08-15 DIAGNOSIS — D62 Acute posthemorrhagic anemia: Secondary | ICD-10-CM | POA: Diagnosis not present

## 2023-08-15 DIAGNOSIS — N179 Acute kidney failure, unspecified: Secondary | ICD-10-CM | POA: Diagnosis not present

## 2023-08-16 DIAGNOSIS — I10 Essential (primary) hypertension: Secondary | ICD-10-CM | POA: Diagnosis not present

## 2023-08-16 DIAGNOSIS — G629 Polyneuropathy, unspecified: Secondary | ICD-10-CM | POA: Diagnosis not present

## 2023-08-16 DIAGNOSIS — F1090 Alcohol use, unspecified, uncomplicated: Secondary | ICD-10-CM | POA: Diagnosis not present

## 2023-08-16 DIAGNOSIS — D62 Acute posthemorrhagic anemia: Secondary | ICD-10-CM | POA: Diagnosis not present

## 2023-08-16 DIAGNOSIS — Z780 Asymptomatic menopausal state: Secondary | ICD-10-CM | POA: Diagnosis not present

## 2023-08-16 DIAGNOSIS — J449 Chronic obstructive pulmonary disease, unspecified: Secondary | ICD-10-CM | POA: Diagnosis not present

## 2023-08-16 DIAGNOSIS — M1A9XX0 Chronic gout, unspecified, without tophus (tophi): Secondary | ICD-10-CM | POA: Diagnosis not present

## 2023-08-16 DIAGNOSIS — N179 Acute kidney failure, unspecified: Secondary | ICD-10-CM | POA: Diagnosis not present

## 2023-08-17 DIAGNOSIS — N179 Acute kidney failure, unspecified: Secondary | ICD-10-CM | POA: Diagnosis not present

## 2023-08-17 DIAGNOSIS — K922 Gastrointestinal hemorrhage, unspecified: Secondary | ICD-10-CM | POA: Diagnosis not present

## 2023-08-17 DIAGNOSIS — M6281 Muscle weakness (generalized): Secondary | ICD-10-CM | POA: Diagnosis not present

## 2023-08-17 DIAGNOSIS — J449 Chronic obstructive pulmonary disease, unspecified: Secondary | ICD-10-CM | POA: Diagnosis not present

## 2023-08-17 DIAGNOSIS — I10 Essential (primary) hypertension: Secondary | ICD-10-CM | POA: Diagnosis not present

## 2023-08-17 DIAGNOSIS — D62 Acute posthemorrhagic anemia: Secondary | ICD-10-CM | POA: Diagnosis not present

## 2023-08-17 DIAGNOSIS — D696 Thrombocytopenia, unspecified: Secondary | ICD-10-CM | POA: Diagnosis not present

## 2023-08-17 DIAGNOSIS — F1090 Alcohol use, unspecified, uncomplicated: Secondary | ICD-10-CM | POA: Diagnosis not present

## 2023-08-20 DIAGNOSIS — F1090 Alcohol use, unspecified, uncomplicated: Secondary | ICD-10-CM | POA: Diagnosis not present

## 2023-08-20 DIAGNOSIS — F4322 Adjustment disorder with anxiety: Secondary | ICD-10-CM | POA: Diagnosis not present

## 2023-08-20 DIAGNOSIS — I10 Essential (primary) hypertension: Secondary | ICD-10-CM | POA: Diagnosis not present

## 2023-08-20 DIAGNOSIS — J449 Chronic obstructive pulmonary disease, unspecified: Secondary | ICD-10-CM | POA: Diagnosis not present

## 2023-08-20 DIAGNOSIS — D62 Acute posthemorrhagic anemia: Secondary | ICD-10-CM | POA: Diagnosis not present

## 2023-08-20 DIAGNOSIS — N179 Acute kidney failure, unspecified: Secondary | ICD-10-CM | POA: Diagnosis not present

## 2023-08-20 DIAGNOSIS — K922 Gastrointestinal hemorrhage, unspecified: Secondary | ICD-10-CM | POA: Diagnosis not present

## 2023-08-20 DIAGNOSIS — M6281 Muscle weakness (generalized): Secondary | ICD-10-CM | POA: Diagnosis not present

## 2023-08-20 DIAGNOSIS — D696 Thrombocytopenia, unspecified: Secondary | ICD-10-CM | POA: Diagnosis not present

## 2023-08-22 DIAGNOSIS — J449 Chronic obstructive pulmonary disease, unspecified: Secondary | ICD-10-CM | POA: Diagnosis not present

## 2023-08-22 DIAGNOSIS — N179 Acute kidney failure, unspecified: Secondary | ICD-10-CM | POA: Diagnosis not present

## 2023-08-22 DIAGNOSIS — M6281 Muscle weakness (generalized): Secondary | ICD-10-CM | POA: Diagnosis not present

## 2023-08-22 DIAGNOSIS — D696 Thrombocytopenia, unspecified: Secondary | ICD-10-CM | POA: Diagnosis not present

## 2023-08-22 DIAGNOSIS — I10 Essential (primary) hypertension: Secondary | ICD-10-CM | POA: Diagnosis not present

## 2023-08-22 DIAGNOSIS — K922 Gastrointestinal hemorrhage, unspecified: Secondary | ICD-10-CM | POA: Diagnosis not present

## 2023-08-22 DIAGNOSIS — D62 Acute posthemorrhagic anemia: Secondary | ICD-10-CM | POA: Diagnosis not present

## 2023-08-22 DIAGNOSIS — F1090 Alcohol use, unspecified, uncomplicated: Secondary | ICD-10-CM | POA: Diagnosis not present

## 2023-08-24 DIAGNOSIS — D62 Acute posthemorrhagic anemia: Secondary | ICD-10-CM | POA: Diagnosis not present

## 2023-08-24 DIAGNOSIS — F1090 Alcohol use, unspecified, uncomplicated: Secondary | ICD-10-CM | POA: Diagnosis not present

## 2023-08-24 DIAGNOSIS — K922 Gastrointestinal hemorrhage, unspecified: Secondary | ICD-10-CM | POA: Diagnosis not present

## 2023-08-24 DIAGNOSIS — N179 Acute kidney failure, unspecified: Secondary | ICD-10-CM | POA: Diagnosis not present

## 2023-08-24 DIAGNOSIS — J449 Chronic obstructive pulmonary disease, unspecified: Secondary | ICD-10-CM | POA: Diagnosis not present

## 2023-08-24 DIAGNOSIS — I10 Essential (primary) hypertension: Secondary | ICD-10-CM | POA: Diagnosis not present

## 2023-08-24 DIAGNOSIS — M6281 Muscle weakness (generalized): Secondary | ICD-10-CM | POA: Diagnosis not present

## 2023-08-24 DIAGNOSIS — D696 Thrombocytopenia, unspecified: Secondary | ICD-10-CM | POA: Diagnosis not present

## 2023-08-27 DIAGNOSIS — I10 Essential (primary) hypertension: Secondary | ICD-10-CM | POA: Diagnosis not present

## 2023-08-27 DIAGNOSIS — F1090 Alcohol use, unspecified, uncomplicated: Secondary | ICD-10-CM | POA: Diagnosis not present

## 2023-08-27 DIAGNOSIS — D62 Acute posthemorrhagic anemia: Secondary | ICD-10-CM | POA: Diagnosis not present

## 2023-08-27 DIAGNOSIS — J449 Chronic obstructive pulmonary disease, unspecified: Secondary | ICD-10-CM | POA: Diagnosis not present

## 2023-08-27 DIAGNOSIS — K922 Gastrointestinal hemorrhage, unspecified: Secondary | ICD-10-CM | POA: Diagnosis not present

## 2023-08-27 DIAGNOSIS — M6281 Muscle weakness (generalized): Secondary | ICD-10-CM | POA: Diagnosis not present

## 2023-08-27 DIAGNOSIS — D696 Thrombocytopenia, unspecified: Secondary | ICD-10-CM | POA: Diagnosis not present

## 2023-08-27 DIAGNOSIS — N179 Acute kidney failure, unspecified: Secondary | ICD-10-CM | POA: Diagnosis not present

## 2023-08-29 DIAGNOSIS — K922 Gastrointestinal hemorrhage, unspecified: Secondary | ICD-10-CM | POA: Diagnosis not present

## 2023-08-29 DIAGNOSIS — D696 Thrombocytopenia, unspecified: Secondary | ICD-10-CM | POA: Diagnosis not present

## 2023-08-29 DIAGNOSIS — N179 Acute kidney failure, unspecified: Secondary | ICD-10-CM | POA: Diagnosis not present

## 2023-08-29 DIAGNOSIS — F1090 Alcohol use, unspecified, uncomplicated: Secondary | ICD-10-CM | POA: Diagnosis not present

## 2023-08-29 DIAGNOSIS — M6281 Muscle weakness (generalized): Secondary | ICD-10-CM | POA: Diagnosis not present

## 2023-08-29 DIAGNOSIS — R630 Anorexia: Secondary | ICD-10-CM | POA: Diagnosis not present

## 2023-08-29 DIAGNOSIS — I10 Essential (primary) hypertension: Secondary | ICD-10-CM | POA: Diagnosis not present

## 2023-08-29 DIAGNOSIS — D62 Acute posthemorrhagic anemia: Secondary | ICD-10-CM | POA: Diagnosis not present

## 2023-08-30 DIAGNOSIS — D62 Acute posthemorrhagic anemia: Secondary | ICD-10-CM | POA: Diagnosis not present

## 2023-08-30 DIAGNOSIS — G629 Polyneuropathy, unspecified: Secondary | ICD-10-CM | POA: Diagnosis not present

## 2023-08-30 DIAGNOSIS — J449 Chronic obstructive pulmonary disease, unspecified: Secondary | ICD-10-CM | POA: Diagnosis not present

## 2023-08-30 DIAGNOSIS — N179 Acute kidney failure, unspecified: Secondary | ICD-10-CM | POA: Diagnosis not present

## 2023-08-30 DIAGNOSIS — M1A9XX0 Chronic gout, unspecified, without tophus (tophi): Secondary | ICD-10-CM | POA: Diagnosis not present

## 2023-08-30 DIAGNOSIS — R630 Anorexia: Secondary | ICD-10-CM | POA: Diagnosis not present

## 2023-08-30 DIAGNOSIS — I10 Essential (primary) hypertension: Secondary | ICD-10-CM | POA: Diagnosis not present

## 2023-08-30 DIAGNOSIS — M6281 Muscle weakness (generalized): Secondary | ICD-10-CM | POA: Diagnosis not present

## 2023-09-03 DIAGNOSIS — J449 Chronic obstructive pulmonary disease, unspecified: Secondary | ICD-10-CM | POA: Diagnosis not present

## 2023-09-03 DIAGNOSIS — R531 Weakness: Secondary | ICD-10-CM | POA: Diagnosis not present

## 2023-09-03 DIAGNOSIS — R2689 Other abnormalities of gait and mobility: Secondary | ICD-10-CM | POA: Diagnosis not present

## 2023-09-03 DIAGNOSIS — R6521 Severe sepsis with septic shock: Secondary | ICD-10-CM | POA: Diagnosis not present

## 2023-09-03 DIAGNOSIS — Z9181 History of falling: Secondary | ICD-10-CM | POA: Diagnosis not present

## 2023-09-03 DIAGNOSIS — R262 Difficulty in walking, not elsewhere classified: Secondary | ICD-10-CM | POA: Diagnosis not present

## 2023-09-03 DIAGNOSIS — N178 Other acute kidney failure: Secondary | ICD-10-CM | POA: Diagnosis not present

## 2023-09-03 DIAGNOSIS — M6281 Muscle weakness (generalized): Secondary | ICD-10-CM | POA: Diagnosis not present

## 2023-09-04 ENCOUNTER — Encounter (INDEPENDENT_AMBULATORY_CARE_PROVIDER_SITE_OTHER): Payer: Self-pay | Admitting: Gastroenterology

## 2023-09-07 ENCOUNTER — Other Ambulatory Visit: Payer: Self-pay

## 2023-09-07 ENCOUNTER — Inpatient Hospital Stay (HOSPITAL_COMMUNITY): Payer: Medicare HMO

## 2023-09-07 ENCOUNTER — Emergency Department (HOSPITAL_COMMUNITY): Payer: Medicare HMO

## 2023-09-07 ENCOUNTER — Inpatient Hospital Stay (HOSPITAL_COMMUNITY): Payer: BC Managed Care – PPO

## 2023-09-07 ENCOUNTER — Encounter (HOSPITAL_COMMUNITY): Payer: Self-pay

## 2023-09-07 ENCOUNTER — Inpatient Hospital Stay (HOSPITAL_COMMUNITY)
Admission: EM | Admit: 2023-09-07 | Discharge: 2023-10-10 | DRG: 870 | Disposition: E | Payer: Medicare HMO | Attending: Pulmonary Disease | Admitting: Pulmonary Disease

## 2023-09-07 DIAGNOSIS — Z6824 Body mass index (BMI) 24.0-24.9, adult: Secondary | ICD-10-CM

## 2023-09-07 DIAGNOSIS — R627 Adult failure to thrive: Secondary | ICD-10-CM | POA: Diagnosis present

## 2023-09-07 DIAGNOSIS — Z90710 Acquired absence of both cervix and uterus: Secondary | ICD-10-CM

## 2023-09-07 DIAGNOSIS — R0989 Other specified symptoms and signs involving the circulatory and respiratory systems: Secondary | ICD-10-CM | POA: Diagnosis not present

## 2023-09-07 DIAGNOSIS — J90 Pleural effusion, not elsewhere classified: Secondary | ICD-10-CM | POA: Diagnosis not present

## 2023-09-07 DIAGNOSIS — F1010 Alcohol abuse, uncomplicated: Secondary | ICD-10-CM | POA: Diagnosis present

## 2023-09-07 DIAGNOSIS — Z8249 Family history of ischemic heart disease and other diseases of the circulatory system: Secondary | ICD-10-CM

## 2023-09-07 DIAGNOSIS — N179 Acute kidney failure, unspecified: Secondary | ICD-10-CM | POA: Diagnosis not present

## 2023-09-07 DIAGNOSIS — R918 Other nonspecific abnormal finding of lung field: Secondary | ICD-10-CM | POA: Diagnosis not present

## 2023-09-07 DIAGNOSIS — D638 Anemia in other chronic diseases classified elsewhere: Secondary | ICD-10-CM | POA: Diagnosis present

## 2023-09-07 DIAGNOSIS — J1282 Pneumonia due to coronavirus disease 2019: Secondary | ICD-10-CM | POA: Diagnosis not present

## 2023-09-07 DIAGNOSIS — U071 COVID-19: Secondary | ICD-10-CM | POA: Insufficient documentation

## 2023-09-07 DIAGNOSIS — Z803 Family history of malignant neoplasm of breast: Secondary | ICD-10-CM

## 2023-09-07 DIAGNOSIS — K7682 Hepatic encephalopathy: Secondary | ICD-10-CM | POA: Diagnosis present

## 2023-09-07 DIAGNOSIS — Z743 Need for continuous supervision: Secondary | ICD-10-CM | POA: Diagnosis not present

## 2023-09-07 DIAGNOSIS — Z66 Do not resuscitate: Secondary | ICD-10-CM | POA: Diagnosis not present

## 2023-09-07 DIAGNOSIS — I10 Essential (primary) hypertension: Secondary | ICD-10-CM | POA: Diagnosis not present

## 2023-09-07 DIAGNOSIS — E860 Dehydration: Secondary | ICD-10-CM | POA: Diagnosis present

## 2023-09-07 DIAGNOSIS — G9341 Metabolic encephalopathy: Secondary | ICD-10-CM

## 2023-09-07 DIAGNOSIS — S2249XA Multiple fractures of ribs, unspecified side, initial encounter for closed fracture: Secondary | ICD-10-CM | POA: Diagnosis not present

## 2023-09-07 DIAGNOSIS — N390 Urinary tract infection, site not specified: Secondary | ICD-10-CM

## 2023-09-07 DIAGNOSIS — K625 Hemorrhage of anus and rectum: Secondary | ICD-10-CM | POA: Diagnosis not present

## 2023-09-07 DIAGNOSIS — I4891 Unspecified atrial fibrillation: Secondary | ICD-10-CM | POA: Diagnosis not present

## 2023-09-07 DIAGNOSIS — J440 Chronic obstructive pulmonary disease with acute lower respiratory infection: Secondary | ICD-10-CM | POA: Diagnosis not present

## 2023-09-07 DIAGNOSIS — I6523 Occlusion and stenosis of bilateral carotid arteries: Secondary | ICD-10-CM | POA: Diagnosis not present

## 2023-09-07 DIAGNOSIS — D689 Coagulation defect, unspecified: Secondary | ICD-10-CM | POA: Diagnosis present

## 2023-09-07 DIAGNOSIS — E871 Hypo-osmolality and hyponatremia: Secondary | ICD-10-CM | POA: Diagnosis present

## 2023-09-07 DIAGNOSIS — K7030 Alcoholic cirrhosis of liver without ascites: Secondary | ICD-10-CM | POA: Diagnosis present

## 2023-09-07 DIAGNOSIS — J9601 Acute respiratory failure with hypoxia: Secondary | ICD-10-CM

## 2023-09-07 DIAGNOSIS — Z85828 Personal history of other malignant neoplasm of skin: Secondary | ICD-10-CM

## 2023-09-07 DIAGNOSIS — F32A Depression, unspecified: Secondary | ICD-10-CM | POA: Diagnosis present

## 2023-09-07 DIAGNOSIS — N170 Acute kidney failure with tubular necrosis: Secondary | ICD-10-CM | POA: Diagnosis not present

## 2023-09-07 DIAGNOSIS — R579 Shock, unspecified: Secondary | ICD-10-CM | POA: Diagnosis not present

## 2023-09-07 DIAGNOSIS — R9082 White matter disease, unspecified: Secondary | ICD-10-CM | POA: Diagnosis not present

## 2023-09-07 DIAGNOSIS — E861 Hypovolemia: Secondary | ICD-10-CM | POA: Diagnosis present

## 2023-09-07 DIAGNOSIS — I471 Supraventricular tachycardia, unspecified: Secondary | ICD-10-CM | POA: Diagnosis not present

## 2023-09-07 DIAGNOSIS — G629 Polyneuropathy, unspecified: Secondary | ICD-10-CM | POA: Diagnosis present

## 2023-09-07 DIAGNOSIS — Z8616 Personal history of COVID-19: Secondary | ICD-10-CM | POA: Diagnosis not present

## 2023-09-07 DIAGNOSIS — R6521 Severe sepsis with septic shock: Secondary | ICD-10-CM | POA: Diagnosis present

## 2023-09-07 DIAGNOSIS — I959 Hypotension, unspecified: Secondary | ICD-10-CM | POA: Diagnosis not present

## 2023-09-07 DIAGNOSIS — R188 Other ascites: Secondary | ICD-10-CM | POA: Diagnosis not present

## 2023-09-07 DIAGNOSIS — E874 Mixed disorder of acid-base balance: Secondary | ICD-10-CM | POA: Diagnosis not present

## 2023-09-07 DIAGNOSIS — Z4682 Encounter for fitting and adjustment of non-vascular catheter: Secondary | ICD-10-CM | POA: Diagnosis not present

## 2023-09-07 DIAGNOSIS — F17210 Nicotine dependence, cigarettes, uncomplicated: Secondary | ICD-10-CM | POA: Diagnosis present

## 2023-09-07 DIAGNOSIS — K529 Noninfective gastroenteritis and colitis, unspecified: Secondary | ICD-10-CM | POA: Diagnosis present

## 2023-09-07 DIAGNOSIS — E785 Hyperlipidemia, unspecified: Secondary | ICD-10-CM | POA: Diagnosis present

## 2023-09-07 DIAGNOSIS — R531 Weakness: Secondary | ICD-10-CM | POA: Diagnosis not present

## 2023-09-07 DIAGNOSIS — Z716 Tobacco abuse counseling: Secondary | ICD-10-CM

## 2023-09-07 DIAGNOSIS — E870 Hyperosmolality and hypernatremia: Secondary | ICD-10-CM | POA: Diagnosis not present

## 2023-09-07 DIAGNOSIS — R4182 Altered mental status, unspecified: Secondary | ICD-10-CM | POA: Diagnosis not present

## 2023-09-07 DIAGNOSIS — A419 Sepsis, unspecified organism: Principal | ICD-10-CM | POA: Diagnosis present

## 2023-09-07 DIAGNOSIS — E875 Hyperkalemia: Secondary | ICD-10-CM | POA: Diagnosis not present

## 2023-09-07 DIAGNOSIS — Z515 Encounter for palliative care: Secondary | ICD-10-CM

## 2023-09-07 DIAGNOSIS — Z87412 Personal history of vulvar dysplasia: Secondary | ICD-10-CM

## 2023-09-07 DIAGNOSIS — B9629 Other Escherichia coli [E. coli] as the cause of diseases classified elsewhere: Secondary | ICD-10-CM | POA: Diagnosis not present

## 2023-09-07 DIAGNOSIS — Z800 Family history of malignant neoplasm of digestive organs: Secondary | ICD-10-CM

## 2023-09-07 DIAGNOSIS — A4151 Sepsis due to Escherichia coli [E. coli]: Principal | ICD-10-CM | POA: Diagnosis present

## 2023-09-07 DIAGNOSIS — J9811 Atelectasis: Secondary | ICD-10-CM | POA: Diagnosis not present

## 2023-09-07 DIAGNOSIS — E43 Unspecified severe protein-calorie malnutrition: Secondary | ICD-10-CM | POA: Diagnosis present

## 2023-09-07 DIAGNOSIS — R6520 Severe sepsis without septic shock: Secondary | ICD-10-CM | POA: Diagnosis not present

## 2023-09-07 DIAGNOSIS — J432 Centrilobular emphysema: Secondary | ICD-10-CM | POA: Diagnosis not present

## 2023-09-07 DIAGNOSIS — F419 Anxiety disorder, unspecified: Secondary | ICD-10-CM | POA: Diagnosis present

## 2023-09-07 DIAGNOSIS — J189 Pneumonia, unspecified organism: Secondary | ICD-10-CM | POA: Diagnosis not present

## 2023-09-07 DIAGNOSIS — K7689 Other specified diseases of liver: Secondary | ICD-10-CM | POA: Diagnosis not present

## 2023-09-07 DIAGNOSIS — Z452 Encounter for adjustment and management of vascular access device: Secondary | ICD-10-CM | POA: Diagnosis not present

## 2023-09-07 DIAGNOSIS — Z833 Family history of diabetes mellitus: Secondary | ICD-10-CM

## 2023-09-07 DIAGNOSIS — Z79899 Other long term (current) drug therapy: Secondary | ICD-10-CM

## 2023-09-07 DIAGNOSIS — B3731 Acute candidiasis of vulva and vagina: Secondary | ICD-10-CM | POA: Diagnosis present

## 2023-09-07 DIAGNOSIS — R34 Anuria and oliguria: Secondary | ICD-10-CM | POA: Insufficient documentation

## 2023-09-07 DIAGNOSIS — E162 Hypoglycemia, unspecified: Secondary | ICD-10-CM | POA: Diagnosis present

## 2023-09-07 DIAGNOSIS — R0902 Hypoxemia: Secondary | ICD-10-CM | POA: Diagnosis not present

## 2023-09-07 DIAGNOSIS — Z1612 Extended spectrum beta lactamase (ESBL) resistance: Secondary | ICD-10-CM | POA: Diagnosis present

## 2023-09-07 DIAGNOSIS — E8809 Other disorders of plasma-protein metabolism, not elsewhere classified: Secondary | ICD-10-CM | POA: Diagnosis present

## 2023-09-07 DIAGNOSIS — D696 Thrombocytopenia, unspecified: Secondary | ICD-10-CM | POA: Insufficient documentation

## 2023-09-07 DIAGNOSIS — R1084 Generalized abdominal pain: Secondary | ICD-10-CM | POA: Diagnosis not present

## 2023-09-07 LAB — BASIC METABOLIC PANEL
Anion gap: 18 — ABNORMAL HIGH (ref 5–15)
BUN: 23 mg/dL (ref 8–23)
CO2: 8 mmol/L — ABNORMAL LOW (ref 22–32)
Calcium: 7.6 mg/dL — ABNORMAL LOW (ref 8.9–10.3)
Chloride: 105 mmol/L (ref 98–111)
Creatinine, Ser: 2.21 mg/dL — ABNORMAL HIGH (ref 0.44–1.00)
GFR, Estimated: 24 mL/min — ABNORMAL LOW (ref >60)
Glucose, Bld: 113 mg/dL — ABNORMAL HIGH (ref 70–99)
Potassium: 4.8 mmol/L (ref 3.5–5.1)
Sodium: 131 mmol/L — ABNORMAL LOW (ref 135–145)

## 2023-09-07 LAB — CBC WITH DIFFERENTIAL/PLATELET
Abs Immature Granulocytes: 5 10*3/uL — ABNORMAL HIGH (ref 0.00–0.07)
Band Neutrophils: 22 %
Basophils Absolute: 0 10*3/uL (ref 0.0–0.1)
Basophils Relative: 0 %
Eosinophils Absolute: 0 10*3/uL (ref 0.0–0.5)
Eosinophils Relative: 0 %
HCT: 26.1 % — ABNORMAL LOW (ref 36.0–46.0)
Hemoglobin: 8.7 g/dL — ABNORMAL LOW (ref 12.0–15.0)
Lymphocytes Relative: 5 %
Lymphs Abs: 2.8 10*3/uL (ref 0.7–4.0)
MCH: 35.5 pg — ABNORMAL HIGH (ref 26.0–34.0)
MCHC: 33.3 g/dL (ref 30.0–36.0)
MCV: 106.5 fL — ABNORMAL HIGH (ref 80.0–100.0)
Metamyelocytes Relative: 4 %
Monocytes Absolute: 1.1 10*3/uL — ABNORMAL HIGH (ref 0.1–1.0)
Monocytes Relative: 2 %
Myelocytes: 4 %
Neutro Abs: 46.8 10*3/uL — ABNORMAL HIGH (ref 1.7–7.7)
Neutrophils Relative %: 62 %
Platelets: 131 10*3/uL — ABNORMAL LOW (ref 150–400)
Promyelocytes Relative: 1 %
RBC: 2.45 MIL/uL — ABNORMAL LOW (ref 3.87–5.11)
RDW: 23.7 % — ABNORMAL HIGH (ref 11.5–15.5)
WBC Morphology: INCREASED
WBC: 55.7 10*3/uL (ref 4.0–10.5)
nRBC: 0.1 % (ref 0.0–0.2)

## 2023-09-07 LAB — POCT I-STAT 7, (LYTES, BLD GAS, ICA,H+H)
Acid-base deficit: 17 mmol/L — ABNORMAL HIGH (ref 0.0–2.0)
Bicarbonate: 7.9 mmol/L — ABNORMAL LOW (ref 20.0–28.0)
Calcium, Ion: 1.09 mmol/L — ABNORMAL LOW (ref 1.15–1.40)
HCT: 24 % — ABNORMAL LOW (ref 36.0–46.0)
Hemoglobin: 8.2 g/dL — ABNORMAL LOW (ref 12.0–15.0)
O2 Saturation: 94 %
Patient temperature: 34.5
Potassium: 4.8 mmol/L (ref 3.5–5.1)
Sodium: 132 mmol/L — ABNORMAL LOW (ref 135–145)
TCO2: 8 mmol/L — ABNORMAL LOW (ref 22–32)
pCO2 arterial: <15 mm[Hg] — CL (ref 32–48)
pH, Arterial: 7.338 — ABNORMAL LOW (ref 7.35–7.45)
pO2, Arterial: 63 mm[Hg] — ABNORMAL LOW (ref 83–108)

## 2023-09-07 LAB — COMPREHENSIVE METABOLIC PANEL
ALT: 28 U/L (ref 0–44)
AST: 69 U/L — ABNORMAL HIGH (ref 15–41)
Albumin: <1.5 g/dL — ABNORMAL LOW (ref 3.5–5.0)
Alkaline Phosphatase: 283 U/L — ABNORMAL HIGH (ref 38–126)
Anion gap: 14 (ref 5–15)
BUN: 27 mg/dL — ABNORMAL HIGH (ref 8–23)
CO2: 13 mmol/L — ABNORMAL LOW (ref 22–32)
Calcium: 7.5 mg/dL — ABNORMAL LOW (ref 8.9–10.3)
Chloride: 106 mmol/L (ref 98–111)
Creatinine, Ser: 2.21 mg/dL — ABNORMAL HIGH (ref 0.44–1.00)
GFR, Estimated: 24 mL/min — ABNORMAL LOW (ref >60)
Glucose, Bld: 70 mg/dL (ref 70–99)
Potassium: 5.8 mmol/L — ABNORMAL HIGH (ref 3.5–5.1)
Sodium: 133 mmol/L — ABNORMAL LOW (ref 135–145)
Total Bilirubin: 0.8 mg/dL (ref <1.2)
Total Protein: 4.2 g/dL — ABNORMAL LOW (ref 6.5–8.1)

## 2023-09-07 LAB — ACETAMINOPHEN LEVEL: Acetaminophen (Tylenol), Serum: <10 ug/mL — ABNORMAL LOW (ref 10–30)

## 2023-09-07 LAB — TSH: TSH: 3.003 u[IU]/mL (ref 0.350–4.500)

## 2023-09-07 LAB — MRSA NEXT GEN BY PCR, NASAL: MRSA by PCR Next Gen: DETECTED — AB

## 2023-09-07 LAB — CORTISOL: Cortisol, Plasma: 36.9 ug/dL

## 2023-09-07 LAB — RESP PANEL BY RT-PCR (RSV, FLU A&B, COVID)  RVPGX2
Influenza A by PCR: NEGATIVE
Influenza B by PCR: NEGATIVE
Resp Syncytial Virus by PCR: NEGATIVE
SARS Coronavirus 2 by RT PCR: POSITIVE — AB

## 2023-09-07 LAB — DIC (DISSEMINATED INTRAVASCULAR COAGULATION)PANEL
D-Dimer, Quant: 2.43 ug{FEU}/mL — ABNORMAL HIGH (ref 0.00–0.50)
Fibrinogen: 287 mg/dL (ref 210–475)
INR: 3.7 — ABNORMAL HIGH (ref 0.8–1.2)
Platelets: 121 K/uL — ABNORMAL LOW (ref 150–400)
Prothrombin Time: 36.5 s — ABNORMAL HIGH (ref 11.4–15.2)
Smear Review: NONE SEEN
aPTT: 59 s — ABNORMAL HIGH (ref 24–36)

## 2023-09-07 LAB — HEPATITIS PANEL, ACUTE
HCV Ab: NONREACTIVE
Hep A IgM: NONREACTIVE
Hep B C IgM: NONREACTIVE
Hepatitis B Surface Ag: NONREACTIVE

## 2023-09-07 LAB — LACTIC ACID, PLASMA
Lactic Acid, Venous: 5.7 mmol/L (ref 0.5–1.9)
Lactic Acid, Venous: 6.2 mmol/L (ref 0.5–1.9)
Lactic Acid, Venous: >9 mmol/L (ref 0.5–1.9)

## 2023-09-07 LAB — AMMONIA
Ammonia: 41 umol/L — ABNORMAL HIGH (ref 9–35)
Ammonia: 43 umol/L — ABNORMAL HIGH (ref 9–35)

## 2023-09-07 LAB — GLUCOSE, CAPILLARY
Glucose-Capillary: 55 mg/dL — ABNORMAL LOW (ref 70–99)
Glucose-Capillary: 115 mg/dL — ABNORMAL HIGH (ref 70–99)
Glucose-Capillary: 103 mg/dL — ABNORMAL HIGH (ref 70–99)

## 2023-09-07 LAB — PROTIME-INR
INR: 3.7 — ABNORMAL HIGH (ref 0.8–1.2)
Prothrombin Time: 37 s — ABNORMAL HIGH (ref 11.4–15.2)

## 2023-09-07 LAB — ETHANOL: Alcohol, Ethyl (B): <10 mg/dL

## 2023-09-07 LAB — LIPASE, BLOOD: Lipase: 18 U/L (ref 11–51)

## 2023-09-07 LAB — PROCALCITONIN: Procalcitonin: 25.01 ng/mL

## 2023-09-07 LAB — APTT: aPTT: 59 s — ABNORMAL HIGH (ref 24–36)

## 2023-09-07 MED ORDER — SODIUM CHLORIDE 0.9 % IV SOLN: 250.0000 mL | INTRAVENOUS | Status: AC

## 2023-09-07 MED ORDER — UMECLIDINIUM BROMIDE 62.5 MCG/ACT IN AEPB
1.0000 | INHALATION_SPRAY | Freq: Every day | RESPIRATORY_TRACT | Status: DC
  Filled 2023-09-07: qty 7

## 2023-09-07 MED ORDER — BUDESONIDE 0.5 MG/2ML IN SUSP: 0.5000 mg | Freq: Two times a day (BID) | RESPIRATORY_TRACT | Status: DC

## 2023-09-07 MED ORDER — NOREPINEPHRINE 4 MG/250ML-% IV SOLN
2.0000 ug/min | INTRAVENOUS | Status: DC
  Filled 2023-09-07: qty 250

## 2023-09-07 MED ORDER — FOLIC ACID 1 MG PO TABS
1.0000 mg | ORAL_TABLET | Freq: Every day | ORAL | Status: DC
  Administered 2023-09-09: 1 mg via ORAL
  Filled 2023-09-07 (×2): qty 1

## 2023-09-07 MED ORDER — THIAMINE HCL 100 MG/ML IJ SOLN
100.0000 mg | Freq: Every day | INTRAMUSCULAR | Status: DC
  Administered 2023-09-07 – 2023-09-12 (×6): 100 mg via INTRAVENOUS
  Filled 2023-09-07 (×6): qty 2

## 2023-09-07 MED ORDER — NOREPINEPHRINE 4 MG/250ML-% IV SOLN
0.0000 ug/min | INTRAVENOUS | Status: DC
  Administered 2023-09-07: 2 ug/min via INTRAVENOUS
  Filled 2023-09-07: qty 250

## 2023-09-07 MED ORDER — METRONIDAZOLE 500 MG/100ML IV SOLN
500.0000 mg | Freq: Once | INTRAVENOUS | Status: AC
Start: 2023-09-07 — End: 2023-09-07
  Administered 2023-09-07: 500 mg via INTRAVENOUS
  Filled 2023-09-07: qty 100

## 2023-09-07 MED ORDER — VANCOMYCIN HCL 250 MG PO CAPS: 500.0000 mg | ORAL_CAPSULE | Freq: Four times a day (QID) | ORAL | Status: DC

## 2023-09-07 MED ORDER — DOCUSATE SODIUM 100 MG PO CAPS: 100.0000 mg | ORAL_CAPSULE | Freq: Two times a day (BID) | ORAL | Status: DC | PRN

## 2023-09-07 MED ORDER — SODIUM CHLORIDE 0.9 % IV SOLN
2.0000 g | INTRAVENOUS | Status: DC
  Administered 2023-09-08: 2 g via INTRAVENOUS
  Filled 2023-09-07: qty 12.5

## 2023-09-07 MED ORDER — ACETAMINOPHEN 325 MG PO TABS: 650.0000 mg | ORAL_TABLET | ORAL | Status: DC | PRN

## 2023-09-07 MED ORDER — SODIUM CHLORIDE 0.9 % IV SOLN: 250.0000 mL | INTRAVENOUS | Status: DC

## 2023-09-07 MED ORDER — VANCOMYCIN 50 MG/ML ORAL SOLUTION
125.0000 mg | Freq: Four times a day (QID) | ORAL | Status: DC
  Administered 2023-09-08 – 2023-09-09 (×4): 125 mg via ORAL
  Filled 2023-09-07 (×15): qty 2.5

## 2023-09-07 MED ORDER — SODIUM BICARBONATE 8.4 % IV SOLN
INTRAVENOUS | Status: AC
  Filled 2023-09-07 (×2): qty 1000

## 2023-09-07 MED ORDER — POLYETHYLENE GLYCOL 3350 17 G PO PACK: 17.0000 g | PACK | Freq: Every day | ORAL | Status: DC | PRN

## 2023-09-07 MED ORDER — SODIUM ZIRCONIUM CYCLOSILICATE 10 G PO PACK
10.0000 g | PACK | Freq: Two times a day (BID) | ORAL | Status: DC
  Filled 2023-09-07: qty 1

## 2023-09-07 MED ORDER — PANTOPRAZOLE SODIUM 40 MG PO TBEC: 40.0000 mg | DELAYED_RELEASE_TABLET | Freq: Every day | ORAL | Status: DC

## 2023-09-07 MED ORDER — VANCOMYCIN HCL IN DEXTROSE 1-5 GM/200ML-% IV SOLN
1000.0000 mg | Freq: Once | INTRAVENOUS | Status: AC
Start: 2023-09-07 — End: 2023-09-07
  Administered 2023-09-07: 1000 mg via INTRAVENOUS
  Filled 2023-09-07: qty 200

## 2023-09-07 MED ORDER — CLOTRIMAZOLE 2 % VA CREA
1.0000 | TOPICAL_CREAM | Freq: Every day | VAGINAL | Status: AC
  Administered 2023-09-08 – 2023-09-09 (×3): 1 via VAGINAL
  Filled 2023-09-07: qty 21

## 2023-09-07 MED ORDER — FLUTICASONE FUROATE-VILANTEROL 100-25 MCG/ACT IN AEPB
1.0000 | INHALATION_SPRAY | Freq: Every day | RESPIRATORY_TRACT | Status: DC
  Filled 2023-09-07: qty 28

## 2023-09-07 MED ORDER — LACTATED RINGERS IV BOLUS (SEPSIS)
1000.0000 mL | Freq: Once | INTRAVENOUS | Status: AC
Start: 2023-09-07 — End: 2023-09-07
  Administered 2023-09-07: 1000 mL via INTRAVENOUS

## 2023-09-07 MED ORDER — ALBUMIN HUMAN 5 % IV SOLN
25.0000 g | Freq: Once | INTRAVENOUS | Status: AC
  Administered 2023-09-07: 25 g via INTRAVENOUS
  Filled 2023-09-07: qty 500

## 2023-09-07 MED ORDER — IPRATROPIUM-ALBUTEROL 0.5-2.5 (3) MG/3ML IN SOLN: 3.0000 mL | RESPIRATORY_TRACT | Status: DC | PRN

## 2023-09-07 MED ORDER — FAMOTIDINE IN NACL 20-0.9 MG/50ML-% IV SOLN
20.0000 mg | INTRAVENOUS | Status: DC
  Administered 2023-09-07: 20 mg via INTRAVENOUS
  Filled 2023-09-07: qty 50

## 2023-09-07 MED ORDER — FOLIC ACID 1 MG PO TABS: 1.0000 mg | ORAL_TABLET | Freq: Every day | ORAL | Status: DC

## 2023-09-07 MED ORDER — LACTATED RINGERS IV BOLUS (SEPSIS)
500.0000 mL | Freq: Once | INTRAVENOUS | Status: AC
Start: 2023-09-07 — End: 2023-09-07
  Administered 2023-09-07: 500 mL via INTRAVENOUS

## 2023-09-07 MED ORDER — DEXTROSE 50 % IV SOLN
INTRAVENOUS | Status: AC
  Administered 2023-09-07: 50 mL
  Filled 2023-09-07: qty 50

## 2023-09-07 MED ORDER — SODIUM CHLORIDE 0.9 % IV SOLN
500.0000 mg | INTRAVENOUS | Status: AC
  Administered 2023-09-07 – 2023-09-09 (×3): 500 mg via INTRAVENOUS
  Filled 2023-09-07 (×3): qty 5

## 2023-09-07 MED ORDER — VANCOMYCIN VARIABLE DOSE PER UNSTABLE RENAL FUNCTION (PHARMACIST DOSING): Status: DC

## 2023-09-07 MED ORDER — VITAMIN K1 10 MG/ML IJ SOLN
5.0000 mg | Freq: Once | INTRAVENOUS | Status: AC
  Administered 2023-09-07: 5 mg via INTRAVENOUS
  Filled 2023-09-07 (×2): qty 0.5

## 2023-09-07 MED ORDER — ADULT MULTIVITAMIN W/MINERALS CH: 1.0000 | ORAL_TABLET | Freq: Every day | ORAL | Status: DC

## 2023-09-07 MED ORDER — CEFEPIME HCL 2 G IV SOLR
2.0000 g | Freq: Once | INTRAVENOUS | Status: AC
  Administered 2023-09-07: 2 g via INTRAVENOUS
  Filled 2023-09-07: qty 12.5

## 2023-09-07 MED ORDER — METRONIDAZOLE 500 MG/100ML IV SOLN
500.0000 mg | Freq: Three times a day (TID) | INTRAVENOUS | Status: DC
  Administered 2023-09-07 – 2023-09-10 (×8): 500 mg via INTRAVENOUS
  Filled 2023-09-07 (×8): qty 100

## 2023-09-07 MED ORDER — THIAMINE MONONITRATE 100 MG PO TABS: 100.0000 mg | ORAL_TABLET | Freq: Every day | ORAL | Status: DC

## 2023-09-07 MED ORDER — REVEFENACIN 175 MCG/3ML IN SOLN: 175.0000 ug | Freq: Every day | RESPIRATORY_TRACT | Status: DC

## 2023-09-07 MED ORDER — ALBUMIN HUMAN 25 % IV SOLN
50.0000 g | Freq: Once | INTRAVENOUS | Status: AC
  Administered 2023-09-07: 12.5 g via INTRAVENOUS
  Filled 2023-09-07: qty 200

## 2023-09-07 MED ORDER — IPRATROPIUM-ALBUTEROL 0.5-2.5 (3) MG/3ML IN SOLN
3.0000 mL | Freq: Four times a day (QID) | RESPIRATORY_TRACT | Status: DC
  Administered 2023-09-08 – 2023-09-12 (×19): 3 mL via RESPIRATORY_TRACT
  Filled 2023-09-07 (×19): qty 3

## 2023-09-07 MED ORDER — LACTATED RINGERS IV BOLUS
1000.0000 mL | Freq: Once | INTRAVENOUS | Status: AC
  Administered 2023-09-07: 1000 mL via INTRAVENOUS

## 2023-09-07 MED ORDER — LACTATED RINGERS IV BOLUS (SEPSIS)
250.0000 mL | Freq: Once | INTRAVENOUS | Status: AC
Start: 2023-09-07 — End: 2023-09-07
  Administered 2023-09-07: 250 mL via INTRAVENOUS

## 2023-09-07 MED ORDER — NYSTATIN 100000 UNIT/GM EX CREA
TOPICAL_CREAM | Freq: Three times a day (TID) | CUTANEOUS | Status: DC
  Filled 2023-09-07 (×4): qty 30

## 2023-09-07 MED ORDER — ADULT MULTIVITAMIN W/MINERALS CH
1.0000 | ORAL_TABLET | Freq: Every day | ORAL | Status: DC
  Administered 2023-09-09 – 2023-09-12 (×4): 1 via ORAL
  Filled 2023-09-07 (×5): qty 1

## 2023-09-07 MED ORDER — NOREPINEPHRINE 4 MG/250ML-% IV SOLN
0.0000 ug/min | INTRAVENOUS | Status: DC
  Administered 2023-09-07: 17 ug/min via INTRAVENOUS
  Administered 2023-09-08: 18 ug/min via INTRAVENOUS
  Administered 2023-09-08: 22 ug/min via INTRAVENOUS
  Filled 2023-09-07 (×2): qty 250

## 2023-09-07 MED ORDER — ARFORMOTEROL TARTRATE 15 MCG/2ML IN NEBU: 15.0000 ug | INHALATION_SOLUTION | Freq: Two times a day (BID) | RESPIRATORY_TRACT | Status: DC

## 2023-09-07 MED ORDER — IPRATROPIUM-ALBUTEROL 0.5-2.5 (3) MG/3ML IN SOLN: 3.0000 mL | Freq: Four times a day (QID) | RESPIRATORY_TRACT | Status: DC

## 2023-09-07 MED ORDER — LACTULOSE 10 GM/15ML PO SOLN
30.0000 g | Freq: Every day | ORAL | Status: DC
  Administered 2023-09-08 – 2023-09-11 (×4): 30 g via ORAL
  Filled 2023-09-07 (×5): qty 45

## 2023-09-07 MED ORDER — CHLORHEXIDINE GLUCONATE CLOTH 2 % EX PADS
6.0000 | MEDICATED_PAD | Freq: Every day | CUTANEOUS | Status: DC
  Administered 2023-09-07 – 2023-09-11 (×5): 6 via TOPICAL

## 2023-09-07 MED ORDER — PHENYLEPHRINE HCL-NACL 20-0.9 MG/250ML-% IV SOLN: 25.0000 ug/min | INTRAVENOUS | Status: DC

## 2023-09-07 MED ORDER — INSULIN ASPART 100 UNIT/ML IJ SOLN: 1.0000 [IU] | INTRAMUSCULAR | Status: DC

## 2023-09-07 MED ORDER — LACTATED RINGERS IV SOLN: INTRAVENOUS | Status: DC

## 2023-09-07 NOTE — ED Provider Notes (Addendum)
[place] EMERGENCY DEPARTMENT AT [facility] Provider Note   CSN: [id number] Arrival date & time: 09/07/23  1305     History  Chief Complaint  Patient presents with   Weakness   Failure To Thrive    Patriciajo Wolden Piacente is a 67 y.o. female.  Patient brought in by EMS.  Patient was in rehab.  But now back home.  Patient diagnosed with COVID last Saturday.  Patient feeling weak since has not been eating or drinking very well.  Patient states she has pain in her stomach that started around the first week of November.  Past medical history significant for hypertension and vulvar endothelial neoplasia 3 history of alcohol problems.  Status post abdominal hysterectomy gallbladder removal.  Patient smokes about half a pack cigarettes a day.  Patient last admitted in our system October 18 and discharged on November 2.  They say the patient has a history of hypertension COPD alcohol use disorder hyperlipidemia presented in septic shock at that time admitted to [facility] transfer to [facility], ICU for intubation and vasopressor support.  Stabilized with antibiotics subsequently extubated and further hospitalization complicated of a acute kidney injury requiring cc RRT encephalopathy ileus slowly improving supportive care.   Patient is a full code.  There was some discussion that the patient want to talk about DNR status but she was not sure.  Patient's temp was low 91.5 patient put on warming blanket.  Initial blood pressure 74/50 to get down to 6443 per still in the 70s despite her sepsis 30 cc/kg fluid.  Will give another liter of fluid.  Respiratory rate 20.  Heart rate not tachycardic.  Patient placed on 2 L of nasal cannula oxygen not because her sats were low.  But for comfort.       Home Medications Prior to Admission medications   Medication Sig Start Date End Date Taking? Authorizing Provider  acetaminophen (TYLENOL) 500 MG tablet Take 1,000 mg by mouth every 6  (six) hours as needed for moderate pain.    [provider]  albuterol (VENTOLIN HFA) 108 (90 Base) MCG/ACT inhaler Inhale 2 puffs into the lungs 2 (two) times daily as needed for shortness of breath. 11/11/19   [provider]  allopurinol (ZYLOPRIM) 100 MG tablet Take by mouth.    [provider]  ALPRAZolam Prudy Feeler) 0.5 MG tablet Take 0.5 mg by mouth 3 (three) times daily as needed. 09/03/23   [provider]  ascorbic acid (VITAMIN C) 500 MG tablet Take 500 mg by mouth every other day.    [provider]  B Complex Vitamins (B COMPLEX PO) Take 1 Dose by mouth every other day. Liquid b complex    [provider]  cephALEXin (KEFLEX) 500 MG capsule Take 500 mg by mouth 2 (two) times daily. 09/03/23   [provider]  cetirizine (ZYRTEC) 10 MG tablet Take 10 mg by mouth daily as needed for allergies.    [provider]  Cholecalciferol (DIALYVITE VITAMIN D 5000) 125 MCG (5000 UT) capsule Take 5,000 Units by mouth every other day.    [provider]  estradiol (VIVELLE-DOT) 0.1 MG/24HR Place 1 patch onto the skin 2 (two) times a week.    [provider]  fluticasone (FLONASE) 50 MCG/ACT nasal spray Place 2 sprays into both nostrils daily.    [provider]  folic acid (FOLVITE) 1 MG tablet     [provider]  gabapentin (NEURONTIN) 300  MG capsule Take 300 mg by mouth at bedtime.    [provider]  Glycerin-Polysorbate 80 (REFRESH DRY EYE THERAPY OP) Place 1 drop into both eyes daily as needed (dry eyes).    [provider]  HYDROcodone-acetaminophen (NORCO/VICODIN) 5-325 MG tablet Take 1 tablet by mouth 2 (two) times daily as needed. 07/30/23   [provider]  levocetirizine (XYZAL) 5 MG tablet Take 5 mg by mouth at bedtime.    [provider]  loperamide (IMODIUM) 2 MG capsule Take 1 capsule (2 mg total) by mouth as needed for diarrhea or loose stools.  08/11/23   Leroy Sea, MD  Magnesium 400 MG CAPS Take 400 mg by mouth every other day.    [provider]  Menthol-Methyl Salicylate (SALONPAS PAIN RELIEF PATCH EX) Apply 1-3 patches topically daily as needed (pain).    [provider]  metoprolol succinate (TOPROL-XL) 25 MG 24 hr tablet Take 1 tablet by mouth daily. 06/18/23   [provider]  mirtazapine (REMERON) 7.5 MG tablet Take 7.5 mg by mouth at bedtime.    [provider]  OVER THE COUNTER MEDICATION Take 6 tablets by mouth daily as needed (constipation). Tam herbal laxative    [provider]  pantoprazole (PROTONIX) 40 MG tablet Take 40 mg by mouth See admin instructions. Take 40 mg daily may take a second 40 mg dose as needed for heartburn    [provider]  potassium chloride (KLOR-CON) 10 MEQ tablet Take 10 mEq by mouth daily.    [provider]  potassium chloride (KLOR-CON) 10 MEQ tablet Take 10 mEq by mouth daily. 09/02/23   [provider]  Probiotic CAPS Take 1 capsule by mouth every other day.    [provider]  rosuvastatin (CRESTOR) 5 MG tablet Take 5 mg by mouth daily.    [provider]  thiamine (VITAMIN B-1) 100 MG tablet Take 1 tablet (100 mg total) by mouth daily. 08/11/23   Leroy Sea, MD  torsemide (DEMADEX) 20 MG tablet Take 1 pill as needed for weight gain more than 2.5 pounds above baseline or lower extremity edema. 08/11/23   Leroy Sea, MD  TRELEGY ELLIPTA 100-62.5-25 MCG/ACT AEPB Inhale 1 puff into the lungs daily. 07/11/23   [provider]  trolamine salicylate (ASPERCREME) 10 % cream Apply 1 application topically as needed for muscle pain.    [provider]  Turmeric Curcumin 500 MG CAPS Take 500 mg by mouth every other day.    [provider]  valACYclovir (VALTREX) 1000 MG tablet Take 500-1,000 mg by mouth See admin instructions. Take 500 mg daily, may take 1000 mg instead if  currently breaking out    [provider]  valACYclovir (VALTREX) 500 MG tablet Take 1,000 mg by mouth daily. 09/03/23   [provider]  vitamin E 180 MG (400 UNITS) capsule Take 400 Units by mouth every other day.    [provider]  Wheat Dextrin (BENEFIBER DRINK MIX) PACK Take 4 g by mouth at bedtime. 12/10/19   [name], MD  Zinc 50 MG CAPS Take 50 mg by mouth every other day.    [provider]      Allergies    Diclofenac, Macrobid [nitrofurantoin macrocrystal], and Mirtazapine    Review of Systems   Review of Systems  Constitutional:  Positive for activity change and appetite change. Negative for chills and fever.  HENT:  Negative for ear pain  and sore throat.   Eyes:  Negative for pain and visual disturbance.  Respiratory:  Negative for cough and shortness of breath.   Cardiovascular:  Negative for chest pain and palpitations.  Gastrointestinal:  Positive for abdominal pain. Negative for vomiting.  Genitourinary:  Negative for dysuria and hematuria.  Musculoskeletal:  Negative for arthralgias and back pain.  Skin:  Negative for color change and rash.  Neurological:  Negative for seizures and syncope.  All other systems reviewed and are negative.   Physical Exam Updated Vital Signs BP (!) 64/43 (BP Location: Left Arm)   Temp (!) 92.2 F (33.4 C) (Rectal)   Resp 20   Ht 1.6 m (5\' 3" )   Wt 50.3 kg   BMI 19.66 kg/m  Physical Exam Vitals and nursing note reviewed.  Constitutional:      General: She is not in acute distress.    Appearance: She is well-developed.  HENT:     Head: Normocephalic and atraumatic.     Mouth/Throat:     Mouth: Mucous membranes are dry.  Eyes:     Conjunctiva/sclera: Conjunctivae normal.  Cardiovascular:     Rate and Rhythm: Normal rate and regular rhythm.     Heart sounds: No murmur heard. Pulmonary:     Effort: Pulmonary effort is normal. No respiratory distress.     Breath sounds: Normal  breath sounds.  Abdominal:     General: There is no distension.     Palpations: Abdomen is soft.     Tenderness: There is no abdominal tenderness.  Musculoskeletal:        General: No swelling.     Cervical back: Neck supple.  Skin:    General: Skin is warm and dry.     Capillary Refill: Capillary refill takes less than 2 seconds.  Neurological:     Mental Status: She is alert and oriented to person, place, and time.  Psychiatric:        Mood and Affect: Mood normal.     ED Results / Procedures / Treatments   Labs (all labs ordered are listed, but only abnormal results are displayed) Labs Reviewed  LACTIC ACID, PLASMA - Abnormal; Notable for the following components:      Result Value   Lactic Acid, Venous 5.7 (*)    All other components within normal limits  LACTIC ACID, PLASMA - Abnormal; Notable for the following components:   Lactic Acid, Venous 6.2 (*)    All other components within normal limits  COMPREHENSIVE METABOLIC PANEL - Abnormal; Notable for the following components:   Sodium 133 (*)    Potassium 5.8 (*)    CO2 13 (*)    BUN 27 (*)    Creatinine, Ser 2.21 (*)    Calcium 7.5 (*)    Total Protein 4.2 (*)    Albumin <1.5 (*)    AST 69 (*)    Alkaline Phosphatase 283 (*)    GFR, Estimated 24 (*)    All other components within normal limits  CBC WITH DIFFERENTIAL/PLATELET - Abnormal; Notable for the following components:   WBC 55.7 (*)    RBC 2.45 (*)    Hemoglobin 8.7 (*)    HCT 26.1 (*)    MCV 106.5 (*)    MCH 35.5 (*)    RDW 23.7 (*)    Platelets 131 (*)    Neutro Abs 46.8 (*)    Monocytes Absolute 1.1 (*)    Abs Immature Granulocytes 5.00 (*)  All other components within normal limits  PROTIME-INR - Abnormal; Notable for the following components:   Prothrombin Time 37.0 (*)    INR 3.7 (*)    All other components within normal limits  APTT - Abnormal; Notable for the following components:   aPTT 59 (*)    All other components within normal  limits  RESP PANEL BY RT-PCR (RSV, FLU A&B, COVID)  RVPGX2  CULTURE, BLOOD (ROUTINE X 2)  CULTURE, BLOOD (ROUTINE X 2)  URINALYSIS, W/ REFLEX TO CULTURE (INFECTION SUSPECTED)    EKG None  Radiology DG Chest Port 1 View  Result Date: 09/07/2023 CLINICAL DATA:  COVID infection EXAM: PORTABLE CHEST 1 VIEW COMPARISON:  Chest radiograph dated 08/04/2023 FINDINGS: Mildly low lung volumes. Left retrocardiac patchy and linear opacities. Blunting of the bilateral costophrenic angles. No pneumothorax. The heart size and mediastinal contours are within normal limits. No acute osseous abnormality. Right upper quadrant surgical clips. IMPRESSION: 1. Left retrocardiac patchy and linear opacities, which may represent atelectasis, aspiration, or pneumonia. 2. Blunting of the bilateral costophrenic angles, which may represent trace pleural effusions or pleural thickening. Electronically Signed   By: Agustin Cree M.D.   On: 09/07/2023 13:52    Procedures Procedures    Medications Ordered in ED Medications  lactated ringers infusion ( Intravenous New Bag/Given 09/07/23 1656)  vancomycin variable dose per unstable renal function (pharmacist dosing) (has no administration in time range)  ceFEPIme (MAXIPIME) 2 g in sodium chloride 0.9 % 100 mL IVPB (has no administration in time range)  norepinephrine (LEVOPHED) 4mg  in (0.016 mg/mL) premix infusion (has no administration in time range)  lactated ringers bolus 1,000 mL (0 mLs Intravenous Stopped 09/07/23 1433)    And  lactated ringers bolus 500 mL (0 mLs Intravenous Stopped 09/07/23 1524)    And  lactated ringers bolus 250 mL (0 mLs Intravenous Stopped 09/07/23 1516)  ceFEPIme (MAXIPIME) 2 g in sodium chloride 0.9 % 100 mL IVPB (0 g Intravenous Stopped 09/07/23 1510)  metroNIDAZOLE (FLAGYL) IVPB 500 mg (0 mg Intravenous Stopped 09/07/23 1530)  vancomycin (VANCOCIN) IVPB 1000 mg/200 mL premix (0 mg Intravenous Stopped 09/07/23 1607)  lactated ringers  bolus 1,000 mL (1,000 mLs Intravenous Bolus 09/07/23 1702)    ED Course/ Medical Decision Making/ A&P                                 Medical Decision Making Amount and/or Complexity of Data Reviewed Labs: ordered. Radiology: ordered. ECG/medicine tests: ordered.  Risk Prescription drug management. Decision regarding hospitalization.   Patient met sepsis criteria hypothermic hypotensive sepsis started with broad-spectrum antibiotics.  After 30 cc kilo bolus.  Patient still hypotensive so restarting norepinephrine drip.  Patient will need admission to ICU.  Patient remains alert.  Will put in temp Foley.  Patient does have Bair hugger on to warm her up.  Patient's lactic acid first was 5.7-second was 6.2 very concerning.  Blood cultures done and pending.  Complete metabolic panel potassium 5.8.  Patient's blood sugar was 70.  Creatinine 2.21 for GFR 24 much worse than baseline could consistent with acute kidney injury.  Liver function test alk phos was 283 total bili was normal.  Patient has had a complaint of some abdominal pain but clearly not stable enough for CT of the abdomen.  White blood cell count is 55.7 platelets 131 hemoglobin 8.7 not significant change from before.  Patient's INR 3.7.  Portable chest x-ray left retrocardiac patchy and linear opacities which may reflect atelectasis aspiration or pneumonia.  Discussed with Dr. Francine Graven on for critical care.  Recommending that we get the CT scan here that we do stool culture or panel and that we do C. difficile.  And to start the oral vancomycin for her.  We have started norepinephrine.  Patient seems to be responding.  Also getting another liter of fluid.  He will accept patient for admission to the ICU.   Final Clinical Impression(s) / ED Diagnoses Final diagnoses:  Sepsis, due to unspecified organism, unspecified whether acute organ dysfunction present (HCC)  AKI (acute kidney injury) (HCC)  Hyperkalemia    Rx / DC  Orders ED Discharge Orders     None         Vanetta Mulders, MD 09/07/23 1704    Vanetta Mulders, MD 09/07/23 1721

## 2023-09-07 NOTE — Procedures (Signed)
Arterial Catheter Insertion Procedure Note  Carol Rowe  [id number]  05/24/1956  Date:09/07/23  Time:9:06 PM    Provider Performing: Patrici Ranks    Procedure: Insertion of Arterial Line (40981) with US guidance (19147)   Indication(s) Blood pressure monitoring and/or need for frequent ABGs  Consent Unable to obtain consent due to emergent nature of procedure.  Anesthesia None   Time Out Verified patient identification, verified procedure, site/side was marked, verified correct patient position, special equipment/implants available, medications/allergies/relevant history reviewed, required imaging and test results available.   Sterile Technique Maximal sterile technique including full sterile barrier drape, hand hygiene, sterile gown, sterile gloves, mask, hair covering, sterile ultrasound probe cover (if used).   Procedure Description Area of catheter insertion was cleaned with chlorhexidine and draped in sterile fashion. With real-time ultrasound guidance an arterial catheter failed to be placed into the left radial artery.  Appropriate arterial tracings confirmed on monitor.     Complications/Tolerance None; patient tolerated the procedure well.   EBL Minimal   Specimen(s) None

## 2023-09-07 NOTE — ED Triage Notes (Signed)
Pt bib [facility] from home. Pt states she tested positive for covid last Saturday, and has been feeling weak since. Pt states she has not been eating or drinking good. Also states she has pain in her stomach that started around the first week of November.

## 2023-09-07 NOTE — Consult Note (Signed)
Pharmacy Antibiotic Note  ASSESSMENT: 67 y.o. female is presenting with sepsis. Recently COVID positive on 11/23 and continues to feel poor and weak. CXR show leftsided patchy opacities, concerning for PNA. She is in AKI, as well. Pharmacy has been consulted to manage cefepime and vancomycin dosing. Received one-time doses of cefepime, vancomycin, and metronidazole.  Patient measurements: Height: 5\' 3"  (160 cm) Weight: 50.3 kg (111 lb) IBW/kg (Calculated) : 52.4  Vital signs: BP: 74/52 (11/29 1328) No results for input(s): "WBC", "CREATININE" in the last 168 hours.  Invalid input(s): "LACTICACIDVEN", "PROCALCITONIN" CrCl cannot be calculated (Patient's most recent lab result is older than the maximum 21 days allowed.).  Allergies: Allergies  Allergen Reactions   Diclofenac Other (See Comments)   Macrobid [Nitrofurantoin Macrocrystal] Hives   Mirtazapine Swelling    Antimicrobials this admission: Cefepime 11/29 >> Vancomycin 11/29 >> Metronidazole 11/29 x 1  Dose adjustments this admission: N/A  Microbiology results: 11/29 BCx: sent  PLAN: Initiate cefepime 2 g IV q24H Obtain 24-hr vancomycin random level before re-dosing vancomycin Follow up culture results to assess for antibiotic optimization. Monitor renal function to assess for any necessary antibiotic dosing changes.   Thank you for allowing pharmacy to be a part of this patient's care.  Will M. Dareen Piano, PharmD Clinical Pharmacist 09/07/2023 1:55 PM

## 2023-09-07 NOTE — ED Notes (Signed)
Unable to obtain an O2 at this time, tried multiple different locations. Pt is cold, rectal temp 91.5 (EDP aware) bear hugger in place. Pt stated she felt a little SOB, placed pt on 2L for comfort.

## 2023-09-07 NOTE — Progress Notes (Signed)
eLink Physician-Brief Progress Note Patient Name: Carol Rowe DOB: 1956-04-09 MRN: [id number]   Date of Service  09/07/2023  HPI/Events of Note  Patient admitted from outside hospital with suspected septic shock, pneumonia, and altered mental status. Patient also has acute kidney injury and hyperkalemia.  eICU Interventions  New Patient Evaluation.        Thomasene Lot Murle Otting 09/07/2023, 7:59 PM

## 2023-09-07 NOTE — ED Notes (Signed)
Pt is a difficult stick, attempting to get an IV.

## 2023-09-07 NOTE — H&P (Addendum)
NAME:  Carol Rowe, MRN:  [id number], DOB:  [birth year], LOS: 0 ADMISSION DATE:  09/07/2023, CONSULTATION DATE:  09/07/23 REFERRING MD:  Deretha Emory - [facility], CHIEF COMPLAINT:  generalized weakness    History of Present Illness:  67 yo F PMH etoh cirrhosis, COPD, HTN who was recently admitted 07/2023 for septic shock 2/2 aspiration PNA requiring ETT CRRT pressors (then dc to SNF, then home) who presented to [facility] ED 11/29 with CC generalized weakness. Reportedly tested + for COVID 11/23 and has felt poorly since. Associated poor appetite, reduced PO intake. Also endorsed abd pain x several weeks (n.b during 07/2023 admission there was concern for colitis but Cdiff was neg, concern for ileus)  At [facility], she was reportedly dehydrated appearing and was given IVF. Her labs resulted significant leukocytosis -- WBC 55, as well as worsening LA 5.7 to 6.2, as well as new thrombocytopenia 131 and coagulopathy INR 3.7 so was started on broad abx for sepsis. Her BP ultimately down-trended and she was started on pressors peripherally   PCCM  is called to admit to [facility] campus in this setting  Have requested CT a/p , GI panel, UA, PO vanc in interim    Pertinent  Medical History  Aspiration PNA Septic shock AKI requiring CRRT Alcoholic cirrhosis HTN COPD   Significant Hospital Events: Including procedures, antibiotic start and stop dates in addition to other pertinent events   11/29 [facility] ED to PCCM at [facility] campus.   Interim History / Subjective:  Patient somewhat confused  Objective   Blood pressure (!) 80/50, pulse 82, temperature (!) 92.2 F (33.4 C), temperature source Rectal, resp. rate (!) 30, height 5\' 3"  (1.6 m), weight 50.3 kg, SpO2 97%.       No intake or output data in the 24 hours ending 09/07/23 1722 Filed Weights   09/07/23 1331  Weight: 50.3 kg    Examination: General:  ill appearing female HEENT: MM pink/dry; Watson in place Neuro: altered; able to state name and place; mae; perrl CV:  s1s2, RRR, no m/r/g PULM:  dim clear BS bilaterally;  2L GI: soft, bsx4 active  Extremities: warm/dry, no edema  Skin: no rashes or lesions    Resolved Hospital Problem list     Assessment & Plan:   Reported covid infection -self reported that she tested positive about a week PTA -checking here -isolation precautions   -on minimal o2 and cxr mostly clear; will hold on treatment  Shock: septic +/- hypovolemia -concern for GI source. Recent admit for septic shock 2/2 PNA, there was also concern for colitis at that time but Cdiff neg. Alt could be UTI, bacteremia.  -recent covid positive 11/23; CXR w/ left retrocardiac patchy opacities concerning for atelectasis, aspiration, vs. pna Hypothermia P -NE and IVF for MAP > 65  -add PO vanc -cont broad abx -- has both pseudomonas and MRSA risk factors, and there is concern for GI source so vanc, cefepime, flagyl   -GI panel -Bcx -UA  -trend LA; consider more IV fluids -CT c/a/p  -bair hugger for normothermia -cortisol pending  Acute encephalopathy: likely sepsis; ammonia mildly elevated P -ct head -lactulose; trend ammonia -treat sepsis as above -Limit sedating meds  AKI NAGMA  Hyperkalemia  P -received lokelma at [facility]  -check bmp -urine studies  -CT a/p as above  -Trend BMP / urinary output -Replace electrolytes as indicated -Avoid nephrotoxic agents, ensure adequate renal perfusion  Elevated LFTs Possible alcoholic cirrhosis Mild hyperammonemia -ALP 283, AST 69,  ALT WNL at28  -MELD Na is 30 (Na 133, no HD, Cr 2.21, Tbili 0.8, INR 3.7, Na 133), MELD 3.0 is 32 -normal Tbili. -consider alcoholic hepatitis but not convinced this is reflective of her current picture  -apa level wnl P -hepatitis panel  -trend LFTs, coags -possible RUQ [place] depending on CT  -lactulose; trend ammonia  Coagulopathy Thrombocytopenia Anemia, chronic  -r/t infection vs liver dysfunction  P -trend cbc and coags  Lactic  acidosis -in setting of shock, multisystem organ dysfunction -w hepatic dysfunction, may be slow to clear    Borderline hypoglycemia -poor intake vs liver dysfunction P -q4hr checks. May need augmentation   Candidal Vulvovaginitis MASD on bottom POA P -clotrimazole  Hx COPD -triple therapy nebs -prn duoneb for wheezing -wean Kelso for sats >92%  Hx HTN -holding home antihypertensives given shock   EtOH use  -thiamine, folic acid, mvi  Hx of anxiety/depression? P -hold home remeron and xanax for now  Hx of peripheral neuropathy P -hold gabapentin  Best Practice (right click and "Reselect all SmartList Selections" daily)   Diet/type: NPO DVT prophylaxis:    Pressure ulcer(s): not present on admission  GI prophylaxis: PPI Lines: Central line and Arterial Line Foley:  Yes, and it is still needed Code Status:  full code Last date of multidisciplinary goals of care discussion [11/29 updated sister Burna Mortimer over phone]  Labs   CBC: Recent Labs  Lab 09/07/23 1404  WBC 55.7*  NEUTROABS 46.8*  HGB 8.7*  HCT 26.1*  MCV 106.5*  PLT 131*    Basic Metabolic Panel: Recent Labs  Lab 09/07/23 1404  NA 133*  K 5.8*  CL 106  CO2 13*  GLUCOSE 70  BUN 27*  CREATININE 2.21*  CALCIUM 7.5*   GFR: Estimated Creatinine Clearance: 20.2 mL/min (A) (by C-G formula based on SCr of 2.21 mg/dL (H)). Recent Labs  Lab 09/07/23 1404 09/07/23 1417 09/07/23 1608  WBC 55.7*  --   --   LATICACIDVEN  --  5.7* 6.2*    Liver Function Tests: Recent Labs  Lab 09/07/23 1404  AST 69*  ALT 28  ALKPHOS 283*  BILITOT 0.8  PROT 4.2*  ALBUMIN <1.5*   No results for input(s): "LIPASE", "AMYLASE" in the last 168 hours. No results for input(s): "AMMONIA" in the last 168 hours.  ABG    Component Value Date/Time   PHART 7.523 (H) 07/31/2023 1203   PCO2ART 33.6 07/31/2023 1203   PO2ART 88 07/31/2023 1203   HCO3 27.7 07/31/2023 1203   TCO2 29 07/31/2023 1203   ACIDBASEDEF 1.0  07/30/2023 1327   O2SAT 98 07/31/2023 1203     Coagulation Profile: Recent Labs  Lab 09/07/23 1404  INR 3.7*    Cardiac Enzymes: No results for input(s): "CKTOTAL", "CKMB", "CKMBINDEX", "TROPONINI" in the last 168 hours.  HbA1C: Hgb A1c MFr Bld  Date/Time Value Ref Range Status  07/30/2023 02:14 PM 5.1 4.8 - 5.6 % Final    Comment:    (NOTE) Pre diabetes:          5.7%-6.4%  Diabetes:              >6.4%  Glycemic control for   <7.0% adults with diabetes     CBG: No results for input(s): "GLUCAP" in the last 168 hours.  Review of Systems:   Patient is encephalopathic and/or intubated; therefore, history has been obtained from chart review.    Past Medical History:  She,  has a past medical  history of Alcoholic (HCC), Arthritis, Hypertension, VIN III (vulvar intraepithelial neoplasia III), and Vulvar lesion.   Surgical History:   Past Surgical History:  Procedure Laterality Date   ABDOMINAL HYSTERECTOMY  1989   CESAREAN SECTION     CHOLECYSTECTOMY     COLONOSCOPY N/A 12/10/2019   Procedure: COLONOSCOPY;  Surgeon: [name], MD;  Location: [facility];  Service: Endoscopy;  Laterality: N/A;  730   POLYPECTOMY  12/10/2019   Procedure: POLYPECTOMY;  Surgeon: [name], MD;  Location: [facility];  Service: Endoscopy;;   skin cancer removal       Social History:   reports that she has been smoking cigarettes. She has never used smokeless tobacco. She reports current alcohol use. She reports that she does not use drugs.   Family History:  Her family history includes Breast cancer in her mother; Colon cancer in her father; Diabetes in her paternal grandmother; Heart disease in her father and maternal grandmother.   Allergies Allergies  Allergen Reactions   Diclofenac Other (See Comments)   Macrobid [Nitrofurantoin Macrocrystal] Hives   Mirtazapine Swelling     Home Medications  Prior to Admission medications   Medication Sig Start Date End  Date Taking? Authorizing Provider  acetaminophen (TYLENOL) 500 MG tablet Take 1,000 mg by mouth every 6 (six) hours as needed for moderate pain.    [provider]  albuterol (VENTOLIN HFA) 108 (90 Base) MCG/ACT inhaler Inhale 2 puffs into the lungs 2 (two) times daily as needed for shortness of breath. 11/11/19   [provider]  allopurinol (ZYLOPRIM) 100 MG tablet Take by mouth.    [provider]  ALPRAZolam Prudy Feeler) 0.5 MG tablet Take 0.5 mg by mouth 3 (three) times daily as needed. 09/03/23   [provider]  ascorbic acid (VITAMIN C) 500 MG tablet Take 500 mg by mouth every other day.    [provider]  B Complex Vitamins (B COMPLEX PO) Take 1 Dose by mouth every other day. Liquid b complex    [provider]  cephALEXin (KEFLEX) 500 MG capsule Take 500 mg by mouth 2 (two) times daily. 09/03/23   [provider]  cetirizine (ZYRTEC) 10 MG tablet Take 10 mg by mouth daily as needed for allergies.    [provider]  Cholecalciferol (DIALYVITE VITAMIN D 5000) 125 MCG (5000 UT) capsule Take 5,000 Units by mouth every other day.    [provider]  estradiol (VIVELLE-DOT) 0.1 MG/24HR Place 1 patch onto the skin 2 (two) times a week.    [provider]  fluticasone (FLONASE) 50 MCG/ACT nasal spray Place 2 sprays into both nostrils daily.    [provider]  folic acid (FOLVITE) 1 MG tablet     [provider]  gabapentin (NEURONTIN) 300 MG capsule Take 300 mg by mouth at bedtime.    [provider]  Glycerin-Polysorbate 80 (REFRESH DRY EYE THERAPY OP) Place 1 drop into both eyes daily as needed (dry eyes).    [provider]  HYDROcodone-acetaminophen (NORCO/VICODIN) 5-325 MG tablet Take 1 tablet by mouth 2 (two) times daily as needed. 07/30/23   [provider]  levocetirizine (XYZAL) 5 MG tablet Take 5 mg by mouth at bedtime.    [provider]  loperamide  (IMODIUM) 2 MG capsule Take 1 capsule (2 mg total) by mouth as needed for diarrhea or loose stools. 08/11/23   Leroy Sea, MD  Magnesium 400 MG CAPS  Take 400 mg by mouth every other day.    [provider]  Menthol-Methyl Salicylate (SALONPAS PAIN RELIEF PATCH EX) Apply 1-3 patches topically daily as needed (pain).    [provider]  metoprolol succinate (TOPROL-XL) 25 MG 24 hr tablet Take 1 tablet by mouth daily. 06/18/23   [provider]  mirtazapine (REMERON) 7.5 MG tablet Take 7.5 mg by mouth at bedtime.    [provider]  OVER THE COUNTER MEDICATION Take 6 tablets by mouth daily as needed (constipation). Tam herbal laxative    [provider]  pantoprazole (PROTONIX) 40 MG tablet Take 40 mg by mouth See admin instructions. Take 40 mg daily may take a second 40 mg dose as needed for heartburn    [provider]  potassium chloride (KLOR-CON) 10 MEQ tablet Take 10 mEq by mouth daily.    [provider]  potassium chloride (KLOR-CON) 10 MEQ tablet Take 10 mEq by mouth daily. 09/02/23   [provider]  Probiotic CAPS Take 1 capsule by mouth every other day.    [provider]  rosuvastatin (CRESTOR) 5 MG tablet Take 5 mg by mouth daily.    [provider]  thiamine (VITAMIN B-1) 100 MG tablet Take 1 tablet (100 mg total) by mouth daily. 08/11/23   Leroy Sea, MD  torsemide (DEMADEX) 20 MG tablet Take 1 pill as needed for weight gain more than 2.5 pounds above baseline or lower extremity edema. 08/11/23   Leroy Sea, MD  TRELEGY ELLIPTA 100-62.5-25 MCG/ACT AEPB Inhale 1 puff into the lungs daily. 07/11/23   [provider]  trolamine salicylate (ASPERCREME) 10 % cream Apply 1 application topically as needed for muscle pain.    [provider]  Turmeric Curcumin 500 MG CAPS Take 500 mg by mouth every other day.    [provider]  valACYclovir (VALTREX) 1000 MG tablet  Take 500-1,000 mg by mouth See admin instructions. Take 500 mg daily, may take 1000 mg instead if currently breaking out    [provider]  valACYclovir (VALTREX) 500 MG tablet Take 1,000 mg by mouth daily. 09/03/23   [provider]  vitamin E 180 MG (400 UNITS) capsule Take 400 Units by mouth every other day.    [provider]  Wheat Dextrin (BENEFIBER DRINK MIX) PACK Take 4 g by mouth at bedtime. 12/10/19   [name], MD  Zinc 50 MG CAPS Take 50 mg by mouth every other day.    [provider]     Critical care time: 50 minutes    JD Daryel [facility] 09/07/2023, 8:31 PM  Please see [email or web address] for pager details.  From 7A-7P if no response, please call [phone]. After hours, please call ELink [phone].

## 2023-09-07 NOTE — Consult Note (Signed)
CODE SEPSIS - PHARMACY COMMUNICATION  **Broad-spectrum antimicrobials should be administered within one hour of sepsis diagnosis**  Time Code Sepsis call or page was received: 1330  Antibiotics ordered: Cefepime, Vancomycin, Metronidazole  Time of first antibiotic administration: 1430  Additional action taken by pharmacy: N/A  If necessary, name of provider/nurse contacted: N/A    Will M. Dareen Piano, PharmD Clinical Pharmacist 09/07/2023 1:43 PM

## 2023-09-07 NOTE — Procedures (Signed)
Central Venous Catheter Insertion Procedure Note  MARLAYA LAPERLE  [id number]  [birth year]  Date:09/07/23  Time:9:06 PM   Provider Performing:Ronal Maybury Allen Norris   Procedure: Insertion of Non-tunneled Central Venous Catheter(36556) with US guidance (78295)   Indication(s) Medication administration and Difficult access  Consent Unable to obtain consent due to emergent nature of procedure.  Anesthesia Topical only with 1% lidocaine   Timeout Verified patient identification, verified procedure, site/side was marked, verified correct patient position, special equipment/implants available, medications/allergies/relevant history reviewed, required imaging and test results available.  Sterile Technique Maximal sterile technique including full sterile barrier drape, hand hygiene, sterile gown, sterile gloves, mask, hair covering, sterile ultrasound probe cover (if used).  Procedure Description Area of catheter insertion was cleaned with chlorhexidine and draped in sterile fashion.  With real-time ultrasound guidance a central venous catheter was placed into the left internal jugular vein. Nonpulsatile blood flow and easy flushing noted in all ports.  The catheter was sutured in place and sterile dressing applied.  Complications/Tolerance None; patient tolerated the procedure well. Chest X-ray is ordered to verify placement for internal jugular or subclavian cannulation.   Chest x-ray is not ordered for femoral cannulation.  EBL Minimal  Specimen(s) None

## 2023-09-07 NOTE — Procedures (Signed)
Arterial Catheter Insertion Procedure Note  Carol Rowe  [id number]  12-May-1956  Date:09/07/23  Time:9:07 PM    Provider Performing: Patrici Ranks    Procedure: Insertion of Arterial Line (46962) with US guidance (95284)   Indication(s) Blood pressure monitoring and/or need for frequent ABGs  Consent Unable to obtain consent due to emergent nature of procedure.  Anesthesia None   Time Out Verified patient identification, verified procedure, site/side was marked, verified correct patient position, special equipment/implants available, medications/allergies/relevant history reviewed, required imaging and test results available.   Sterile Technique Maximal sterile technique including full sterile barrier drape, hand hygiene, sterile gown, sterile gloves, mask, hair covering, sterile ultrasound probe cover (if used).   Procedure Description Area of catheter insertion was cleaned with chlorhexidine and draped in sterile fashion. With real-time ultrasound guidance an arterial catheter was placed into the left femoral artery.  Appropriate arterial tracings confirmed on monitor.     Complications/Tolerance None; patient tolerated the procedure well.   EBL Minimal   Specimen(s) None

## 2023-09-08 ENCOUNTER — Inpatient Hospital Stay (HOSPITAL_COMMUNITY): Payer: Medicare HMO

## 2023-09-08 DIAGNOSIS — N179 Acute kidney failure, unspecified: Secondary | ICD-10-CM

## 2023-09-08 DIAGNOSIS — J189 Pneumonia, unspecified organism: Secondary | ICD-10-CM | POA: Diagnosis not present

## 2023-09-08 DIAGNOSIS — R579 Shock, unspecified: Secondary | ICD-10-CM

## 2023-09-08 DIAGNOSIS — A419 Sepsis, unspecified organism: Secondary | ICD-10-CM | POA: Diagnosis not present

## 2023-09-08 LAB — COMPREHENSIVE METABOLIC PANEL
ALT: 25 U/L (ref 0–44)
ALT: 18 U/L (ref 0–44)
ALT: 22 U/L (ref 0–44)
AST: 61 U/L — ABNORMAL HIGH (ref 15–41)
AST: 60 U/L — ABNORMAL HIGH (ref 15–41)
AST: 59 U/L — ABNORMAL HIGH (ref 15–41)
Albumin: 2.8 g/dL — ABNORMAL LOW (ref 3.5–5.0)
Albumin: 2.7 g/dL — ABNORMAL LOW (ref 3.5–5.0)
Albumin: 2.4 g/dL — ABNORMAL LOW (ref 3.5–5.0)
Alkaline Phosphatase: 249 U/L — ABNORMAL HIGH (ref 38–126)
Alkaline Phosphatase: 238 U/L — ABNORMAL HIGH (ref 38–126)
Alkaline Phosphatase: 249 U/L — ABNORMAL HIGH (ref 38–126)
Anion gap: 18 — ABNORMAL HIGH (ref 5–15)
Anion gap: 19 — ABNORMAL HIGH (ref 5–15)
BUN: 21 mg/dL (ref 8–23)
BUN: 22 mg/dL (ref 8–23)
BUN: 20 mg/dL (ref 8–23)
CO2: <7 mmol/L — ABNORMAL LOW (ref 22–32)
CO2: 7 mmol/L — ABNORMAL LOW (ref 22–32)
CO2: 15 mmol/L — ABNORMAL LOW (ref 22–32)
Calcium: 7.5 mg/dL — ABNORMAL LOW (ref 8.9–10.3)
Calcium: 7.4 mg/dL — ABNORMAL LOW (ref 8.9–10.3)
Calcium: 7.7 mg/dL — ABNORMAL LOW (ref 8.9–10.3)
Chloride: 107 mmol/L (ref 98–111)
Chloride: 105 mmol/L (ref 98–111)
Chloride: 100 mmol/L (ref 98–111)
Creatinine, Ser: 2.27 mg/dL — ABNORMAL HIGH (ref 0.44–1.00)
Creatinine, Ser: 2.33 mg/dL — ABNORMAL HIGH (ref 0.44–1.00)
Creatinine, Ser: 1.73 mg/dL — ABNORMAL HIGH (ref 0.44–1.00)
GFR, Estimated: 23 mL/min — ABNORMAL LOW (ref >60)
GFR, Estimated: 23 mL/min — ABNORMAL LOW (ref >60)
GFR, Estimated: 32 mL/min — ABNORMAL LOW (ref >60)
Glucose, Bld: 102 mg/dL — ABNORMAL HIGH (ref 70–99)
Glucose, Bld: 112 mg/dL — ABNORMAL HIGH (ref 70–99)
Glucose, Bld: 173 mg/dL — ABNORMAL HIGH (ref 70–99)
Potassium: 5 mmol/L (ref 3.5–5.1)
Potassium: 4.9 mmol/L (ref 3.5–5.1)
Potassium: 5.2 mmol/L — ABNORMAL HIGH (ref 3.5–5.1)
Sodium: 133 mmol/L — ABNORMAL LOW (ref 135–145)
Sodium: 130 mmol/L — ABNORMAL LOW (ref 135–145)
Sodium: 134 mmol/L — ABNORMAL LOW (ref 135–145)
Total Bilirubin: 1.5 mg/dL — ABNORMAL HIGH (ref <1.2)
Total Bilirubin: 1.3 mg/dL — ABNORMAL HIGH (ref <1.2)
Total Bilirubin: 1.7 mg/dL — ABNORMAL HIGH (ref <1.2)
Total Protein: 4.5 g/dL — ABNORMAL LOW (ref 6.5–8.1)
Total Protein: 4.6 g/dL — ABNORMAL LOW (ref 6.5–8.1)
Total Protein: 4.4 g/dL — ABNORMAL LOW (ref 6.5–8.1)

## 2023-09-08 LAB — CBC
HCT: 20.2 % — ABNORMAL LOW (ref 36.0–46.0)
HCT: 20.5 % — ABNORMAL LOW (ref 36.0–46.0)
HCT: 28.5 % — ABNORMAL LOW (ref 36.0–46.0)
HCT: 27.8 % — ABNORMAL LOW (ref 36.0–46.0)
Hemoglobin: 6.4 g/dL — CL (ref 12.0–15.0)
Hemoglobin: 6.9 g/dL — CL (ref 12.0–15.0)
Hemoglobin: 9.7 g/dL — ABNORMAL LOW (ref 12.0–15.0)
Hemoglobin: 9.6 g/dL — ABNORMAL LOW (ref 12.0–15.0)
MCH: 35 pg — ABNORMAL HIGH (ref 26.0–34.0)
MCH: 35.8 pg — ABNORMAL HIGH (ref 26.0–34.0)
MCH: 32.7 pg (ref 26.0–34.0)
MCH: 32.7 pg (ref 26.0–34.0)
MCHC: 31.7 g/dL (ref 30.0–36.0)
MCHC: 33.7 g/dL (ref 30.0–36.0)
MCHC: 34 g/dL (ref 30.0–36.0)
MCHC: 34.5 g/dL (ref 30.0–36.0)
MCV: 110.4 fL — ABNORMAL HIGH (ref 80.0–100.0)
MCV: 106.2 fL — ABNORMAL HIGH (ref 80.0–100.0)
MCV: 96 fL (ref 80.0–100.0)
MCV: 94.6 fL (ref 80.0–100.0)
Platelets: 110 10*3/uL — ABNORMAL LOW (ref 150–400)
Platelets: 81 10*3/uL — ABNORMAL LOW (ref 150–400)
Platelets: 68 10*3/uL — ABNORMAL LOW (ref 150–400)
Platelets: 60 10*3/uL — ABNORMAL LOW (ref 150–400)
RBC: 1.83 MIL/uL — ABNORMAL LOW (ref 3.87–5.11)
RBC: 1.93 MIL/uL — ABNORMAL LOW (ref 3.87–5.11)
RBC: 2.97 MIL/uL — ABNORMAL LOW (ref 3.87–5.11)
RBC: 2.94 MIL/uL — ABNORMAL LOW (ref 3.87–5.11)
RDW: 23.2 % — ABNORMAL HIGH (ref 11.5–15.5)
RDW: 23.6 % — ABNORMAL HIGH (ref 11.5–15.5)
RDW: 22.6 % — ABNORMAL HIGH (ref 11.5–15.5)
RDW: 23.6 % — ABNORMAL HIGH (ref 11.5–15.5)
WBC: 55.6 10*3/uL (ref 4.0–10.5)
WBC: 65.9 10*3/uL (ref 4.0–10.5)
WBC: 58.2 10*3/uL (ref 4.0–10.5)
WBC: 54.9 10*3/uL (ref 4.0–10.5)
nRBC: 0.2 % (ref 0.0–0.2)
nRBC: 0.2 % (ref 0.0–0.2)
nRBC: 0.1 % (ref 0.0–0.2)
nRBC: 0.2 % (ref 0.0–0.2)

## 2023-09-08 LAB — POCT I-STAT 7, (LYTES, BLD GAS, ICA,H+H)
Acid-base deficit: 18 mmol/L — ABNORMAL HIGH (ref 0.0–2.0)
Acid-base deficit: 10 mmol/L — ABNORMAL HIGH (ref 0.0–2.0)
Acid-base deficit: 7 mmol/L — ABNORMAL HIGH (ref 0.0–2.0)
Bicarbonate: 6.8 mmol/L — ABNORMAL LOW (ref 20.0–28.0)
Bicarbonate: 15.5 mmol/L — ABNORMAL LOW (ref 20.0–28.0)
Bicarbonate: 16.6 mmol/L — ABNORMAL LOW (ref 20.0–28.0)
Calcium, Ion: 1.05 mmol/L — ABNORMAL LOW (ref 1.15–1.40)
Calcium, Ion: 1.06 mmol/L — ABNORMAL LOW (ref 1.15–1.40)
Calcium, Ion: 1.05 mmol/L — ABNORMAL LOW (ref 1.15–1.40)
HCT: 23 % — ABNORMAL LOW (ref 36.0–46.0)
HCT: 22 % — ABNORMAL LOW (ref 36.0–46.0)
HCT: 23 % — ABNORMAL LOW (ref 36.0–46.0)
Hemoglobin: 7.8 g/dL — ABNORMAL LOW (ref 12.0–15.0)
Hemoglobin: 7.5 g/dL — ABNORMAL LOW (ref 12.0–15.0)
Hemoglobin: 7.8 g/dL — ABNORMAL LOW (ref 12.0–15.0)
O2 Saturation: 92 %
O2 Saturation: 98 %
O2 Saturation: 91 %
Patient temperature: 36.4
Patient temperature: 96.8
Patient temperature: 95
Potassium: 4.9 mmol/L (ref 3.5–5.1)
Potassium: 4.5 mmol/L (ref 3.5–5.1)
Potassium: 4.6 mmol/L (ref 3.5–5.1)
Sodium: 132 mmol/L — ABNORMAL LOW (ref 135–145)
Sodium: 136 mmol/L (ref 135–145)
Sodium: 133 mmol/L — ABNORMAL LOW (ref 135–145)
TCO2: 7 mmol/L — ABNORMAL LOW (ref 22–32)
TCO2: 16 mmol/L — ABNORMAL LOW (ref 22–32)
TCO2: 17 mmol/L — ABNORMAL LOW (ref 22–32)
pCO2 arterial: <15 mm[Hg] — CL (ref 32–48)
pCO2 arterial: 28.9 mm[Hg] — ABNORMAL LOW (ref 32–48)
pCO2 arterial: 23.4 mm[Hg] — ABNORMAL LOW (ref 32–48)
pH, Arterial: 7.268 — ABNORMAL LOW (ref 7.35–7.45)
pH, Arterial: 7.333 — ABNORMAL LOW (ref 7.35–7.45)
pH, Arterial: 7.45 (ref 7.35–7.45)
pO2, Arterial: 67 mm[Hg] — ABNORMAL LOW (ref 83–108)
pO2, Arterial: 101 mm[Hg] (ref 83–108)
pO2, Arterial: 50 mm[Hg] — ABNORMAL LOW (ref 83–108)

## 2023-09-08 LAB — BLOOD CULTURE ID PANEL (REFLEXED) - BCID2

## 2023-09-08 LAB — RESPIRATORY PANEL BY PCR

## 2023-09-08 LAB — GASTROINTESTINAL PANEL BY PCR, STOOL (REPLACES STOOL CULTURE)

## 2023-09-08 LAB — C DIFFICILE QUICK SCREEN W PCR REFLEX
C Diff antigen: POSITIVE — AB
C Diff toxin: NEGATIVE

## 2023-09-08 LAB — VANCOMYCIN, RANDOM: Vancomycin Rm: 11 ug/mL

## 2023-09-08 LAB — PREPARE RBC (CROSSMATCH)

## 2023-09-08 LAB — GLUCOSE, CAPILLARY
Glucose-Capillary: 154 mg/dL — ABNORMAL HIGH (ref 70–99)
Glucose-Capillary: 143 mg/dL — ABNORMAL HIGH (ref 70–99)

## 2023-09-08 LAB — MAGNESIUM: Magnesium: 1.4 mg/dL — ABNORMAL LOW (ref 1.7–2.4)

## 2023-09-08 LAB — PROTIME-INR
INR: 4.7 (ref 0.8–1.2)
Prothrombin Time: 44.1 s — ABNORMAL HIGH (ref 11.4–15.2)

## 2023-09-08 LAB — CLOSTRIDIUM DIFFICILE BY PCR, REFLEXED: Toxigenic C. Difficile by PCR: NEGATIVE

## 2023-09-08 LAB — PHOSPHORUS: Phosphorus: 4.6 mg/dL (ref 2.5–4.6)

## 2023-09-08 LAB — LACTIC ACID, PLASMA: Lactic Acid, Venous: >9 mmol/L (ref 0.5–1.9)

## 2023-09-08 MED ORDER — ROCURONIUM BROMIDE 10 MG/ML (PF) SYRINGE
PREFILLED_SYRINGE | INTRAVENOUS | Status: AC
  Administered 2023-09-08: 50 mg
  Filled 2023-09-08: qty 10

## 2023-09-08 MED ORDER — SODIUM CHLORIDE 0.9% IV SOLUTION
Freq: Once | INTRAVENOUS | Status: DC
Start: 2023-09-08 — End: 2023-09-08

## 2023-09-08 MED ORDER — ORAL CARE MOUTH RINSE
15.0000 mL | OROMUCOSAL | Status: DC
  Administered 2023-09-08 – 2023-09-12 (×49): 15 mL via OROMUCOSAL

## 2023-09-08 MED ORDER — DEXMEDETOMIDINE HCL IN NACL 400 MCG/100ML IV SOLN
0.0000 ug/kg/h | INTRAVENOUS | Status: DC
  Administered 2023-09-08: 0.4 ug/kg/h via INTRAVENOUS
  Filled 2023-09-08: qty 100

## 2023-09-08 MED ORDER — SODIUM CHLORIDE 0.9 % IV SOLN
1.0000 g | INTRAVENOUS | Status: DC
  Administered 2023-09-08 – 2023-09-09 (×2): 1 g via INTRAVENOUS
  Filled 2023-09-08 (×2): qty 20

## 2023-09-08 MED ORDER — PROPOFOL 1000 MG/100ML IV EMUL
0.0000 ug/kg/min | INTRAVENOUS | Status: DC
  Administered 2023-09-08: 5 ug/kg/min via INTRAVENOUS
  Administered 2023-09-08 – 2023-09-09 (×4): 25 ug/kg/min via INTRAVENOUS
  Filled 2023-09-08 (×6): qty 100

## 2023-09-08 MED ORDER — PANTOPRAZOLE SODIUM 40 MG IV SOLR
40.0000 mg | Freq: Two times a day (BID) | INTRAVENOUS | Status: DC
  Administered 2023-09-08 – 2023-09-12 (×9): 40 mg via INTRAVENOUS
  Filled 2023-09-08 (×9): qty 10

## 2023-09-08 MED ORDER — VASOPRESSIN 20 UNITS/100 ML INFUSION FOR SHOCK
0.0000 [IU]/min | INTRAVENOUS | Status: DC
  Administered 2023-09-08 – 2023-09-10 (×7): 0.03 [IU]/min via INTRAVENOUS
  Filled 2023-09-08 (×9): qty 100

## 2023-09-08 MED ORDER — ETOMIDATE 2 MG/ML IV SOLN
INTRAVENOUS | Status: AC
  Filled 2023-09-08: qty 20

## 2023-09-08 MED ORDER — SODIUM BICARBONATE 8.4 % IV SOLN
INTRAVENOUS | Status: AC
  Filled 2023-09-08: qty 50

## 2023-09-08 MED ORDER — SODIUM BICARBONATE 8.4 % IV SOLN
100.0000 meq | Freq: Once | INTRAVENOUS | Status: AC
  Administered 2023-09-08: 100 meq via INTRAVENOUS
  Filled 2023-09-08: qty 100

## 2023-09-08 MED ORDER — EPINEPHRINE 1 MG/10ML IJ SOSY
PREFILLED_SYRINGE | INTRAMUSCULAR | Status: AC
  Filled 2023-09-08: qty 30

## 2023-09-08 MED ORDER — CALCIUM GLUCONATE-NACL 1-0.675 GM/50ML-% IV SOLN
1.0000 g | Freq: Once | INTRAVENOUS | Status: AC
  Administered 2023-09-08: 1000 mg via INTRAVENOUS
  Filled 2023-09-08: qty 50

## 2023-09-08 MED ORDER — SODIUM BICARBONATE 8.4 % IV SOLN
INTRAVENOUS | Status: AC
  Filled 2023-09-08: qty 100

## 2023-09-08 MED ORDER — FENTANYL CITRATE PF 50 MCG/ML IJ SOSY
25.0000 ug | PREFILLED_SYRINGE | Freq: Once | INTRAMUSCULAR | Status: AC
  Administered 2023-09-08: 25 ug via INTRAVENOUS

## 2023-09-08 MED ORDER — OLANZAPINE 5 MG PO TBDP
5.0000 mg | ORAL_TABLET | Freq: Once | ORAL | Status: DC | PRN
  Filled 2023-09-08: qty 1

## 2023-09-08 MED ORDER — SODIUM CHLORIDE 0.9% IV SOLUTION: Freq: Once | INTRAVENOUS | Status: AC

## 2023-09-08 MED ORDER — ORAL CARE MOUTH RINSE: 15.0000 mL | OROMUCOSAL | Status: DC | PRN

## 2023-09-08 MED ORDER — NOREPINEPHRINE 16 MG/250ML-% IV SOLN
0.0000 ug/min | INTRAVENOUS | Status: DC
  Administered 2023-09-08: 30 ug/min via INTRAVENOUS
  Administered 2023-09-08: 26 ug/min via INTRAVENOUS
  Administered 2023-09-09: 17 ug/min via INTRAVENOUS
  Administered 2023-09-09: 10 ug/min via INTRAVENOUS
  Administered 2023-09-11: 2 ug/min via INTRAVENOUS
  Filled 2023-09-08 (×5): qty 250

## 2023-09-08 MED ORDER — ALBUTEROL SULFATE (2.5 MG/3ML) 0.083% IN NEBU
INHALATION_SOLUTION | RESPIRATORY_TRACT | Status: AC
  Filled 2023-09-08: qty 3

## 2023-09-08 MED ORDER — VANCOMYCIN HCL 750 MG/150ML IV SOLN: 750.0000 mg | INTRAVENOUS | Status: DC

## 2023-09-08 MED ORDER — VANCOMYCIN HCL 750 MG/150ML IV SOLN
750.0000 mg | Freq: Once | INTRAVENOUS | Status: AC
  Administered 2023-09-08: 750 mg via INTRAVENOUS
  Filled 2023-09-08: qty 150

## 2023-09-08 MED ORDER — INSULIN ASPART 100 UNIT/ML IJ SOLN
0.0000 [IU] | INTRAMUSCULAR | Status: DC
  Administered 2023-09-08: 2 [IU] via SUBCUTANEOUS
  Administered 2023-09-08 – 2023-09-11 (×10): 1 [IU] via SUBCUTANEOUS
  Administered 2023-09-12: 2 [IU] via SUBCUTANEOUS
  Administered 2023-09-12: 1 [IU] via SUBCUTANEOUS

## 2023-09-08 MED ORDER — VITAMIN K1 10 MG/ML IJ SOLN
5.0000 mg | Freq: Once | INTRAVENOUS | Status: AC
  Administered 2023-09-08: 5 mg via INTRAVENOUS
  Filled 2023-09-08: qty 0.5

## 2023-09-08 MED ORDER — FENTANYL 2500MCG IN NS 250ML (10MCG/ML) PREMIX INFUSION
25.0000 ug/h | INTRAVENOUS | Status: DC
  Administered 2023-09-08: 25 ug/h via INTRAVENOUS
  Administered 2023-09-09: 75 ug/h via INTRAVENOUS
  Filled 2023-09-08 (×3): qty 250

## 2023-09-08 MED ORDER — HYDROCORTISONE SOD SUC (PF) 100 MG IJ SOLR
100.0000 mg | Freq: Three times a day (TID) | INTRAMUSCULAR | Status: DC
  Administered 2023-09-08 – 2023-09-12 (×13): 100 mg via INTRAVENOUS
  Filled 2023-09-08 (×14): qty 2

## 2023-09-08 MED ORDER — STERILE WATER FOR INJECTION IV SOLN: INTRAVENOUS | Status: DC

## 2023-09-08 MED ORDER — CALCIUM GLUCONATE-NACL 2-0.675 GM/100ML-% IV SOLN
2.0000 g | Freq: Once | INTRAVENOUS | Status: AC
  Administered 2023-09-08: 2000 mg via INTRAVENOUS
  Filled 2023-09-08: qty 100

## 2023-09-08 MED ORDER — SODIUM BICARBONATE 8.4 % IV SOLN
100.0000 meq | Freq: Once | INTRAVENOUS | Status: AC
Start: 2023-09-08 — End: 2023-09-08

## 2023-09-08 MED ORDER — FENTANYL BOLUS VIA INFUSION
25.0000 ug | INTRAVENOUS | Status: DC | PRN
  Administered 2023-09-08: 25 ug via INTRAVENOUS
  Administered 2023-09-09: 50 ug via INTRAVENOUS
  Administered 2023-09-09 – 2023-09-10 (×2): 25 ug via INTRAVENOUS

## 2023-09-08 MED ORDER — MIDAZOLAM HCL 2 MG/2ML IJ SOLN
INTRAMUSCULAR | Status: AC
  Administered 2023-09-08: 2 mg
  Filled 2023-09-08: qty 2

## 2023-09-08 MED ORDER — DOCUSATE SODIUM 50 MG/5ML PO LIQD
100.0000 mg | Freq: Two times a day (BID) | ORAL | Status: DC
  Administered 2023-09-08 – 2023-09-09 (×4): 100 mg
  Filled 2023-09-08 (×5): qty 10

## 2023-09-08 MED ORDER — POLYETHYLENE GLYCOL 3350 17 G PO PACK
17.0000 g | PACK | Freq: Every day | ORAL | Status: DC
  Administered 2023-09-09: 17 g
  Filled 2023-09-08 (×2): qty 1

## 2023-09-08 MED ORDER — GERHARDT'S BUTT CREAM
TOPICAL_CREAM | Freq: Two times a day (BID) | CUTANEOUS | Status: DC
  Filled 2023-09-08 (×2): qty 1

## 2023-09-08 MED ORDER — VANCOMYCIN HCL IN DEXTROSE 1-5 GM/200ML-% IV SOLN: 1000.0000 mg | INTRAVENOUS | Status: DC

## 2023-09-08 MED ORDER — STERILE WATER FOR INJECTION IV SOLN
INTRAVENOUS | Status: DC
  Filled 2023-09-08 (×2): qty 1000

## 2023-09-08 MED ORDER — FENTANYL CITRATE PF 50 MCG/ML IJ SOSY
PREFILLED_SYRINGE | INTRAMUSCULAR | Status: AC
  Administered 2023-09-08: 50 ug
  Filled 2023-09-08: qty 2

## 2023-09-08 MED ORDER — PHENYLEPHRINE 80 MCG/ML (10ML) SYRINGE FOR IV PUSH (FOR BLOOD PRESSURE SUPPORT)
PREFILLED_SYRINGE | INTRAVENOUS | Status: AC
  Filled 2023-09-08: qty 10

## 2023-09-08 MED ORDER — MAGNESIUM SULFATE 2 GM/50ML IV SOLN
2.0000 g | Freq: Once | INTRAVENOUS | Status: AC
  Administered 2023-09-08: 2 g via INTRAVENOUS
  Filled 2023-09-08: qty 50

## 2023-09-08 NOTE — Progress Notes (Signed)
Initial Nutrition Assessment  DOCUMENTATION CODES:  Not applicable  INTERVENTION:  When stable to feed, recommend the following: Vital AF 1.2 at 55 ml/h (1320 ml per day) Start at 15 and advance by 10mL q8h to goal of 55 Provides 1584 kcal, 99 gm protein, 1071 ml free water daily When starting nutrition or dextrose containing IVF, pt will be at risk for refeeding given poor PO intake and hx of malnutrition. Monitor magnesium and phosphorus every 12 hours x 4 occurrences, MD to replete as needed. MVI, thiamine, and folic acid daily for hx EtOH abuse If unable to feed enterally in 3-5 days, recommend initiation of TPN for nutrition support as pt has a hx of malnutrition and has reported poor PO intake since onset of illness  NUTRITION DIAGNOSIS:  Inadequate oral intake related to inability to eat as evidenced by NPO status.  GOAL:  Patient will meet greater than or equal to 90% of their needs  MONITOR:  I & O's, Vent status, Labs, Weight trends  REASON FOR ASSESSMENT:  Consult Assessment of nutrition requirement/status (malnutrition)  ASSESSMENT:  Pt with hx of HTN, COPD, cirrhosis and EtOH abuse presented to ED from home with weakness and poor PO ongoing since being dx with COVID 11/23. Admitted with sepsis.  Noted pt recently admitted from 10/18-11/2 and required ICU stay and CRRT. Pt had been discharged to SNF for rehab but had returned home prior to this admission.  11/29 - presented to [facility] ED, transferred to [facility] for ICU level care 11/30 - intubated   Patient is currently intubated on ventilator support. OGT in place with side port in the gastric body. Noted that family reports that pt had signed DNR form while at rehab but was waiting on MD to also sign. Continuing aggressive measures for now to determine cause of current illness.   Pt with rising lactic acid levels, worsening anion gap, and escalating pressor support. Not appropriate or stable for enteral nutrition  this AM. Pt dx with severe malnutrition during last hospital stay. Will leave feeding recommendations to be utilized as soon as pt is stable enough to receive. Would recommend initiation of TPN if unable to feed enterally in the next 3-5 days due to malnutrition.  MV: 12.6 L/min Temp (24hrs), Avg:95.5 F (35.3 C), Min:91.5 F (33.1 C), Max:98.2 F (36.8 C) Arterial Line MAP = 79  Propofol: 7.55 ml/hr (199 kcal/d at current infusion rate)  Admit / Current weight: 50.3 kg No recent weight hx available in chart   Intake/Output Summary (Last 24 hours) at 09/08/2023 1052 Last data filed at 09/08/2023 1000 Gross per 24 hour  Intake 4362.75 ml  Output 150 ml  Net 4212.75 ml  Net IO Since Admission: 4,212.75 mL [09/08/23 1052]  Nutritionally Relevant Medications: Scheduled Meds:  docusate  100 mg BID   folic acid  1 mg Daily   hydrocortisone sod succinate (SOLU-CORTEF)   100 mg Q8H   lactulose  30 g Daily   multivitamin with minerals  1 tablet Daily   pantoprazole IV  40 mg Q12H   polyethylene glycol  17 g Daily   sodium zirconium cyclosilicate  10 g BID   thiamine (VITAMIN B1)   100 mg Daily   vancomycin  125 mg Q6H   Continuous Infusions:  azithromycin Stopped (09/08/23 0029)   ceFEPime (MAXIPIME) IV     famotidine (PEPCID) IV Stopped (09/07/23 2353)   metronidazole 100 mL/hr at 09/08/23 0600   norepinephrine (LEVOPHED) Adult infusion 30  mcg/min (09/08/23 0801)   phenylephrine (NEO-SYNEPHRINE) Adult infusion     propofol (DIPRIVAN) infusion 5 mcg/kg/min (09/08/23 0813)   sodium bicarbonate 150 mEq in dextrose 5 % 1,150 mL infusion 125 mL/hr at 09/08/23 0759   vasopressin 0.03 Units/min (09/08/23 0600)   PRN Meds: docusate sodium, EPINEPHrine, polyethylene glycol  Labs Reviewed: Creatinine 2.33 Mg 1.4 Lactic Acid >9.0 CBG ranges from 55-115 mg/dL over the last 24 hours HgbA1c 5.1% (10/21)  NUTRITION - FOCUSED PHYSICAL EXAM: Defer to in-person assessment  Diet  Order:   Diet Order             Diet NPO time specified Except for: Sips with Meds  Diet effective now                   EDUCATION NEEDS:  Not appropriate for education at this time  Skin:  Skin Assessment: Reviewed RN Assessment MASD to the perineum/buttocks area (6 x 2 inches)  Last BM:  11/30 - type 6 x 4 Clostridium difficile positive this admission  Height:  Ht Readings from Last 1 Encounters:  09/08/23 5\' 3"  (1.6 m)    Weight:  Wt Readings from Last 1 Encounters:  09/07/23 50.3 kg    Ideal Body Weight:  52.3 kg  BMI:  Body mass index is 19.66 kg/m.  Estimated Nutritional Needs:  Kcal:  1500-1800 kcal/d Protein:  80-100g/d Fluid:  1.5-1.8L/d    Greig Castilla, RD, LDN Registered Dietitian II RD pager # available in AMION  After hours/weekend pager # available in [facility]

## 2023-09-08 NOTE — Progress Notes (Signed)
Pt transported to and from CT scan on the ventilator without incident.

## 2023-09-08 NOTE — Progress Notes (Signed)
PCCM: Resident Note   NAME:  Carol Rowe, MRN:  [id number], DOB:  1956/01/14, LOS: 1 ADMISSION DATE:  09/07/2023, CONSULTATION DATE:  09/07/23 REFERRING MD:  Deretha Emory - [facility], CHIEF COMPLAINT:  generalized weakness   History of Present Illness:  67 yo F PMH etoh cirrhosis, COPD, HTN who was recently admitted 07/2023 for septic shock 2/2 aspiration PNA requiring ETT CRRT pressors (then dc to SNF, then home) who presented to [facility] ED 11/29 with CC generalized weakness. Reportedly tested + for COVID 11/23 and has felt poorly since. Associated poor appetite, reduced PO intake. Also endorsed abd pain x several weeks (n.b during 07/2023 admission there was concern for colitis but Cdiff was neg, concern for ileus)  At [facility], she was reportedly dehydrated appearing and was given IVF. Her labs resulted significant leukocytosis -- WBC 55, as well as worsening LA 5.7 to 6.2, as well as new thrombocytopenia 131 and coagulopathy INR 3.7 so was started on broad abx for sepsis. Her BP ultimately down-trended and she was started on pressors peripherally    PCCM  is called to admit to [facility] campus in this setting  Have requested CT a/p , GI panel, UA, PO vanc in interim    Pertinent  Medical History  Aspiration PNA Septic shock AKI requiring CRRT Alcoholic cirrhosis HTN COPD   Significant Hospital Events: Including procedures, antibiotic start and stop dates in addition to other pertinent events   11/29 [facility] ED to PCCM at [facility] campus.   Interim History / Subjective:  N/A  Review of Systems:   Patient is not participating   Objective   Blood pressure 101/61, pulse 90, temperature (!) 97.2 F (36.2 C), resp. rate (!) 30, height 5\' 3"  (1.6 m), weight 50.3 kg, SpO2 100%.    Vent Mode: PRVC FiO2 (%):  [40 %-100 %] 60 % Set Rate:  [28 bmp-30 bmp] 28 bmp Vt Set:  [410 mL] 410 mL PEEP:  [5 cmH20] 5 cmH20 Plateau Pressure:  [13 cmH20] 13 cmH20   Intake/Output Summary (Last 24 hours) at 09/08/2023 1034 Last  data filed at 09/08/2023 0600 Gross per 24 hour  Intake 3469.14 ml  Output 150 ml  Net 3319.14 ml   Filed Weights   09/07/23 1331  Weight: 50.3 kg    Examination: General: Appears distressed  HENT:  Lungs: No wheezes or rhonchi, tachypnea, increased work of breathing on 5L [place] Cardiovascular: tahcycardia, regular rhythm  Abdomen: distended, soft  Extremities: Bilateral PE  Neuro: Pupils are sluggish, 5mm, withdrawels to pain on LLE, unresponsive  GU: Foley catheter in place  Labs   CBC:    Latest Ref Rng & Units 09/08/2023    9:25 AM 09/08/2023    5:42 AM 09/08/2023    3:25 AM  CBC  WBC 4.0 - 10.5 K/uL   55.6   Hemoglobin 12.0 - 15.0 g/dL 7.5  7.8  6.4  C  Hematocrit 36.0 - 46.0 % 22.0  23.0  20.2  C  Platelets 150 - 400 K/uL   110     C Corrected result     Basic Metabolic Panel:    Latest Ref Rng & Units 09/08/2023    9:25 AM 09/08/2023    5:53 AM 09/08/2023    5:42 AM  BMP  Glucose 70 - 99 mg/dL  557    BUN 8 - 23 mg/dL  22    Creatinine 3.22 - 1.00 mg/dL  0.25    Sodium 427 - 062 mmol/L 136  130  132   Potassium 3.5 - 5.1 mmol/L 4.5  4.9  4.9   Chloride 98 - 111 mmol/L  105    CO2 22 - 32 mmol/L  7    Calcium 8.9 - 10.3 mg/dL  7.4      GFR: Estimated Creatinine Clearance: 19.1 mL/min (A) (by C-G formula based on SCr of 2.33 mg/dL (H)). Recent Labs  Lab 09/07/23 1404 09/07/23 1417 09/07/23 1608 09/07/23 1802 09/07/23 2200 09/08/23 0325  PROCALCITON  --   --   --  25.01  --   --   WBC 55.7*  --   --   --   --  55.6*  LATICACIDVEN  --  5.7* 6.2*  --  >9.0* >9.0*    Liver Function Tests: Recent Labs  Lab 09/07/23 1404 09/08/23 0553  AST 69* 60*  ALT 28 18  ALKPHOS 283* 238*  BILITOT 0.8 1.3*  PROT 4.2* 4.6*  ALBUMIN <1.5* 2.7*   Recent Labs  Lab 09/07/23 2200  LIPASE 18   Recent Labs  Lab 09/07/23 1802 09/07/23 2200  AMMONIA 41* 43*    ABG    Component Value Date/Time   PHART 7.333 (L) 09/08/2023 0925   PCO2ART 28.9 (L)  09/08/2023 0925   PO2ART 101 09/08/2023 0925   HCO3 15.5 (L) 09/08/2023 0925   TCO2 16 (L) 09/08/2023 0925   ACIDBASEDEF 10.0 (H) 09/08/2023 0925   O2SAT 98 09/08/2023 0925     Coagulation Profile: Recent Labs  Lab 09/07/23 1404 09/07/23 1802 09/08/23 0533  INR 3.7* 3.7* 4.7*   Lactic Acid, Venous    Component Value Date/Time   LATICACIDVEN >9.0 (HH) 09/08/2023 0325     HbA1C: Hgb A1c MFr Bld  Date/Time Value Ref Range Status  07/30/2023 02:14 PM 5.1 4.8 - 5.6 % Final    Comment:    (NOTE) Pre diabetes:          5.7%-6.4%  Diabetes:              >6.4%  Glycemic control for   <7.0% adults with diabetes     Head CT: 1. No acute intracranial abnormality or significant interval change. 2. Stable moderate generalized atrophy and white matter disease. This likely reflects the sequela of chronic microvascular ischemia.  Resolved Hospital Problem list     Assessment & Plan:  Septic Shock, GI source?  Lactic acidosis CXR shows left bibasilar patchy opacities, source of septic shock is unknown at this time. High suspicion for GI source (bowel ischemia with increasing WBC of 55.6k and lactic acid>9). Patient required intubation this morning.  -CT scan of chest, abdomen, pelvis ordered -Continue vasopressors for MAP greater than 65 -Continue cefepime, Zithromax, Flagyl -Patient initially presented hypothermic, continue Bair hugger -Begin stress dose steroids, cortisone 100 mg IV every 8 hours -C. difficile toxin is negative -Continue ventilatory lung protection settings, patient was compensating -CODE STATUS changed to DNR, may intubate  Acute metabolic encephalopathy  Most likely due to sepsis, head CT ordered in the setting of elevated INR -Head CT does not reveal acute intracranial abnormality or significant interval change -GCS of 6 this morning, intubated to protect airway -Continue lactulose due to elevated ammonia and history of presumed liver cirrhosis -Hold  centrally acting medications  AKI Oliguria AGMA Renal function is worsening, Scr 2.21-->2.33, patient is oliguric and metabolic acidosis with an anion gap is worsening, anion gap is now 18. Will discuss initiating CRRT with sister and son after CT scan  Thrombocytopenia, coagulopathy Patient's INR increased from 3.7 on arrival to 4.7.  Possibly in the setting of sepsis versus underlying liver cirrhosis -Patient has received 2 days of vitamin K IV, check INR tomorrow morning  Anemia Hemoglobin at 6.4 this morning, 1 unit PRBCs ordered.  Patient has not received transfusion yet, repeat H&H 2 hours after patient receives transfusion. -CBC every 6 hours  COPD COVID + Continue DuoNeb every 6 hours Continue airborne and contact precautions for 1 week    Best Practice (right click and "Reselect all SmartList Selections" daily)   Diet/type: NPO DVT prophylaxis: SCDs GI prophylaxis: H2B Lines: L internal jugular  Foley:  Indwelling catheter  Code Status:  DNR- may intubate

## 2023-09-08 NOTE — Progress Notes (Signed)
eLink Physician-Brief Progress Note Patient Name: Carol Rowe DOB: 29-May-1956 MRN: [id number]   Date of Service  09/08/2023  HPI/Events of Note    eICU Interventions     Bedside requested an order for sliding scale insulin. Not on tube feeds but is on steriods.   SSI placed      [facility] 09/08/2023, 9:06 PM

## 2023-09-08 NOTE — Procedures (Signed)
Intubation Procedure Note  Carol Rowe  [id number]  11-10-1955  Date:09/08/23  Time:8:09 AM   Provider Performing:Chiyoko Torrico B Trimaine Maser    Procedure: Intubation (31500)  Indication(s) Respiratory Failure  Consent Unable to obtain consent due to emergent nature of procedure.   Anesthesia Versed, Fentanyl, and Rocuronium   Time Out Verified patient identification, verified procedure, site/side was marked, verified correct patient position, special equipment/implants available, medications/allergies/relevant history reviewed, required imaging and test results available.   Sterile Technique Usual hand hygeine, masks, and gloves were used   Procedure Description Patient positioned in bed supine.  Sedation given as noted above.  Patient was intubated with endotracheal tube using Glidescope.  View was Grade 1 full glottis .  Number of attempts was 1.  Colorimetric CO2 detector was consistent with tracheal placement.   Complications/Tolerance None; patient tolerated the procedure well. Chest X-ray is ordered to verify placement.   EBL Minimal   Specimen(s) None

## 2023-09-08 NOTE — Progress Notes (Signed)
PHARMACY - PHYSICIAN COMMUNICATION CRITICAL VALUE ALERT - BLOOD CULTURE IDENTIFICATION (BCID)  Carol Rowe is an 67 y.o. female who presented to [facility] on 09/07/2023 with shock. Started on azithromycin, metronidazole,vancomycin and cefepime WBC 65 hypothermic, Cr 2.3 crcl 46ml/min. BCID with ESBL ecoli   Name of physician (or Provider) Contacted: Dr Francine Graven   Changes to prescribed antibiotics recommended:  Stop cefepime  Start meropenem 1gm IV q24h  Continue vancomycin, azithromycin, metronidazole for now   Results for orders placed or performed during the hospital encounter of 09/07/23  Blood Culture ID Panel (Reflexed) (Collected: 09/07/2023  2:04 PM)  Result Value Ref Range   Enterococcus faecalis NOT DETECTED NOT DETECTED   Enterococcus Faecium NOT DETECTED NOT DETECTED   Listeria monocytogenes NOT DETECTED NOT DETECTED   Staphylococcus species NOT DETECTED NOT DETECTED   Staphylococcus aureus (BCID) NOT DETECTED NOT DETECTED   Staphylococcus epidermidis NOT DETECTED NOT DETECTED   Staphylococcus lugdunensis NOT DETECTED NOT DETECTED   Streptococcus species NOT DETECTED NOT DETECTED   Streptococcus agalactiae NOT DETECTED NOT DETECTED   Streptococcus pneumoniae NOT DETECTED NOT DETECTED   Streptococcus pyogenes NOT DETECTED NOT DETECTED   A.calcoaceticus-baumannii NOT DETECTED NOT DETECTED   Bacteroides fragilis NOT DETECTED NOT DETECTED   Enterobacterales DETECTED (A) NOT DETECTED   Enterobacter cloacae complex NOT DETECTED NOT DETECTED   Escherichia coli DETECTED (A) NOT DETECTED   Klebsiella aerogenes NOT DETECTED NOT DETECTED   Klebsiella oxytoca NOT DETECTED NOT DETECTED   Klebsiella pneumoniae NOT DETECTED NOT DETECTED   Proteus species NOT DETECTED NOT DETECTED   Salmonella species NOT DETECTED NOT DETECTED   Serratia marcescens NOT DETECTED NOT DETECTED   Haemophilus influenzae NOT DETECTED NOT DETECTED   Neisseria meningitidis NOT DETECTED NOT DETECTED    Pseudomonas aeruginosa NOT DETECTED NOT DETECTED   Stenotrophomonas maltophilia NOT DETECTED NOT DETECTED   Candida albicans NOT DETECTED NOT DETECTED   Candida auris NOT DETECTED NOT DETECTED   Candida glabrata NOT DETECTED NOT DETECTED   Candida krusei NOT DETECTED NOT DETECTED   Candida parapsilosis NOT DETECTED NOT DETECTED   Candida tropicalis NOT DETECTED NOT DETECTED   Cryptococcus neoformans/gattii NOT DETECTED NOT DETECTED   CTX-M ESBL DETECTED (A) NOT DETECTED   Carbapenem resistance IMP NOT DETECTED NOT DETECTED   Carbapenem resistance KPC NOT DETECTED NOT DETECTED   Carbapenem resistance NDM NOT DETECTED NOT DETECTED   Carbapenem resist OXA 48 LIKE NOT DETECTED NOT DETECTED   Carbapenem resistance VIM NOT DETECTED NOT DETECTED    Leota Sauers Pharm.D. CPP, BCPS Clinical Pharmacist [phone] 09/08/2023 6:47 PM

## 2023-09-08 NOTE — Progress Notes (Addendum)
Spoke with Burna Mortimer (sister) who is consenting for CRRT if needed in the near future. Unable to contact son.

## 2023-09-08 NOTE — Progress Notes (Signed)
Pharmacy Antibiotic Note  Carol Rowe is a 67 y.o. female admitted on 09/07/2023 with sepsis.  Pharmacy has been consulted for vancomycin dosing, also on meropenem.  Random vancomycin level 11 about 26.5 hrs post 1000 mg dose. SCr has trended down to 1.73, UOP is low.   Plan: Vancomycin 750 mg IV x1 dose  Monitor renal function Re-dose based on renal function trend Narrow as able  Height: 5\' 3"  (160 cm) Weight: 50.3 kg (111 lb) IBW/kg (Calculated) : 52.4  Temp (24hrs), Avg:96.1 F (35.6 C), Min:93.4 F (34.1 C), Max:98.8 F (37.1 C)  Recent Labs  Lab 09/07/23 1404 09/07/23 1417 09/07/23 1608 09/07/23 2200 09/08/23 0325 09/08/23 0553 09/08/23 1325 09/08/23 1837  WBC 55.7*  --   --   --  55.6*  --  65.9* 58.2*  CREATININE 2.21*  --   --  2.21*  --  2.33*  --  1.73*  LATICACIDVEN  --  5.7* 6.2* >9.0* >9.0*  --   --   --   VANCORANDOM  --   --   --   --   --   --   --  11    Estimated Creatinine Clearance: 25.7 mL/min (A) (by C-G formula based on SCr of 1.73 mg/dL (H)).    Allergies  Allergen Reactions   Diclofenac Other (See Comments)   Macrobid [Nitrofurantoin Macrocrystal] Hives   Mirtazapine Swelling     Thank you for involving pharmacy in this patient's care.  Loura Back, PharmD, BCPS Clinical Pharmacist Clinical phone for 09/08/2023 is [phone] 09/08/2023 8:04 PM

## 2023-09-08 NOTE — Progress Notes (Signed)
IV titration not follow per order set . MD @ bedside w/ verbals to tritiate against order set. Start of levo @ 30 and half levo and then off.

## 2023-09-08 NOTE — IPAL (Addendum)
  Interdisciplinary Goals of Care Family Meeting   Date carried out: 09/08/2023  Location of the meeting: Phone conference  Member's involved: Physician and Family Member or next of kin  Durable Power of Attorney or acting medical decision maker: I spoke with Margie Billet, son Will did not pick answer phone call   Discussion: We discussed goals of care for [employer] .  She had signed a DNR form at [facility] in [place] and she was waiting on the physician to sign the form. Burna Mortimer go this information from [place] who had mentioned she wanted to be comfortable and was tired of being in pain. Anniebell did not want to go to the hospital in the first place but Burna Mortimer called EMS to have her evaluated. Burna Mortimer agrees she should be DNR but would like further workup to be done if possible to determine what is wrong. She agreed to continue aggressive measures for now.   Code status:   Code Status: Do not attempt resuscitation (DNR) PRE-ARREST INTERVENTIONS DESIRED   Disposition: Continue current acute care  Time spent for the meeting: 15 minutes    Martina Sinner, MD  09/08/2023, 2:32 PM

## 2023-09-08 NOTE — Progress Notes (Signed)
PCCM Update:  Spoke with patient's sister, Carol Rowe at bedside. She states patient's son does not want to be involved in decision making. We discussed CRRT further, given the risks and benefits of the therapy. I discussed that CRRT would not likely change the long term outcome for Carol Rowe. Carol Rowe also expressed Carol Rowe would not want any of the aggressive care that she is currently receiving. We agreed to continue current treatment and not pursue CRRT.   Melody Comas, MD [place] [facility] Office: [phone]   See Amion for personal pager PCCM on call pager [phone] until 7pm. Please call Elink 7p-7a. [phone]

## 2023-09-09 ENCOUNTER — Inpatient Hospital Stay (HOSPITAL_COMMUNITY): Payer: Medicare HMO

## 2023-09-09 DIAGNOSIS — N179 Acute kidney failure, unspecified: Secondary | ICD-10-CM | POA: Diagnosis not present

## 2023-09-09 DIAGNOSIS — R579 Shock, unspecified: Secondary | ICD-10-CM | POA: Diagnosis not present

## 2023-09-09 DIAGNOSIS — J189 Pneumonia, unspecified organism: Secondary | ICD-10-CM | POA: Diagnosis not present

## 2023-09-09 DIAGNOSIS — A419 Sepsis, unspecified organism: Secondary | ICD-10-CM | POA: Diagnosis not present

## 2023-09-09 LAB — COMPREHENSIVE METABOLIC PANEL
ALT: 24 U/L (ref 0–44)
AST: 39 U/L (ref 15–41)
Albumin: 2.1 g/dL — ABNORMAL LOW (ref 3.5–5.0)
Alkaline Phosphatase: 318 U/L — ABNORMAL HIGH (ref 38–126)
Anion gap: 13 (ref 5–15)
BUN: 19 mg/dL (ref 8–23)
CO2: 22 mmol/L (ref 22–32)
Calcium: 7.2 mg/dL — ABNORMAL LOW (ref 8.9–10.3)
Chloride: 100 mmol/L (ref 98–111)
Creatinine, Ser: 1.91 mg/dL — ABNORMAL HIGH (ref 0.44–1.00)
GFR, Estimated: 29 mL/min — ABNORMAL LOW (ref >60)
Glucose, Bld: 127 mg/dL — ABNORMAL HIGH (ref 70–99)
Potassium: 4.4 mmol/L (ref 3.5–5.1)
Sodium: 135 mmol/L (ref 135–145)
Total Bilirubin: 2 mg/dL — ABNORMAL HIGH (ref <1.2)
Total Protein: 4.1 g/dL — ABNORMAL LOW (ref 6.5–8.1)

## 2023-09-09 LAB — BPAM RBC
Blood Product Expiration Date: 202412252359
ISSUE DATE / TIME: 202411301526
Unit Type and Rh: 5100

## 2023-09-09 LAB — CBC
HCT: 27.8 % — ABNORMAL LOW (ref 36.0–46.0)
HCT: 27 % — ABNORMAL LOW (ref 36.0–46.0)
HCT: 27.5 % — ABNORMAL LOW (ref 36.0–46.0)
HCT: 27.9 % — ABNORMAL LOW (ref 36.0–46.0)
Hemoglobin: 10 g/dL — ABNORMAL LOW (ref 12.0–15.0)
Hemoglobin: 9.4 g/dL — ABNORMAL LOW (ref 12.0–15.0)
Hemoglobin: 9.6 g/dL — ABNORMAL LOW (ref 12.0–15.0)
Hemoglobin: 9.7 g/dL — ABNORMAL LOW (ref 12.0–15.0)
MCH: 33.9 pg (ref 26.0–34.0)
MCH: 32.6 pg (ref 26.0–34.0)
MCH: 32.7 pg (ref 26.0–34.0)
MCH: 33.1 pg (ref 26.0–34.0)
MCHC: 36 g/dL (ref 30.0–36.0)
MCHC: 34.8 g/dL (ref 30.0–36.0)
MCHC: 34.9 g/dL (ref 30.0–36.0)
MCHC: 34.8 g/dL (ref 30.0–36.0)
MCV: 94.2 fL (ref 80.0–100.0)
MCV: 93.8 fL (ref 80.0–100.0)
MCV: 93.5 fL (ref 80.0–100.0)
MCV: 95.2 fL (ref 80.0–100.0)
Platelets: 48 10*3/uL — ABNORMAL LOW (ref 150–400)
Platelets: 37 10*3/uL — ABNORMAL LOW (ref 150–400)
Platelets: 33 10*3/uL — ABNORMAL LOW (ref 150–400)
Platelets: 33 10*3/uL — ABNORMAL LOW (ref 150–400)
RBC: 2.95 MIL/uL — ABNORMAL LOW (ref 3.87–5.11)
RBC: 2.88 MIL/uL — ABNORMAL LOW (ref 3.87–5.11)
RBC: 2.94 MIL/uL — ABNORMAL LOW (ref 3.87–5.11)
RBC: 2.93 MIL/uL — ABNORMAL LOW (ref 3.87–5.11)
RDW: 23.4 % — ABNORMAL HIGH (ref 11.5–15.5)
RDW: 23.4 % — ABNORMAL HIGH (ref 11.5–15.5)
RDW: 23.3 % — ABNORMAL HIGH (ref 11.5–15.5)
RDW: 23.3 % — ABNORMAL HIGH (ref 11.5–15.5)
WBC: 49.9 10*3/uL — ABNORMAL HIGH (ref 4.0–10.5)
WBC: 42.6 10*3/uL — ABNORMAL HIGH (ref 4.0–10.5)
WBC: 39.1 10*3/uL — ABNORMAL HIGH (ref 4.0–10.5)
WBC: 42.4 10*3/uL — ABNORMAL HIGH (ref 4.0–10.5)
nRBC: 0.2 % (ref 0.0–0.2)
nRBC: 0.4 % — ABNORMAL HIGH (ref 0.0–0.2)
nRBC: 0.4 % — ABNORMAL HIGH (ref 0.0–0.2)
nRBC: 0.4 % — ABNORMAL HIGH (ref 0.0–0.2)

## 2023-09-09 LAB — POCT I-STAT 7, (LYTES, BLD GAS, ICA,H+H)
Acid-Base Excess: 1 mmol/L (ref 0.0–2.0)
Acid-Base Excess: 1 mmol/L (ref 0.0–2.0)
Acid-Base Excess: 1 mmol/L (ref 0.0–2.0)
Bicarbonate: 22.7 mmol/L (ref 20.0–28.0)
Bicarbonate: 23.4 mmol/L (ref 20.0–28.0)
Bicarbonate: 23.4 mmol/L (ref 20.0–28.0)
Calcium, Ion: 0.98 mmol/L — ABNORMAL LOW (ref 1.15–1.40)
Calcium, Ion: 0.98 mmol/L — ABNORMAL LOW (ref 1.15–1.40)
Calcium, Ion: 1.01 mmol/L — ABNORMAL LOW (ref 1.15–1.40)
HCT: 29 % — ABNORMAL LOW (ref 36.0–46.0)
HCT: 30 % — ABNORMAL LOW (ref 36.0–46.0)
HCT: 31 % — ABNORMAL LOW (ref 36.0–46.0)
Hemoglobin: 10.2 g/dL — ABNORMAL LOW (ref 12.0–15.0)
Hemoglobin: 9.9 g/dL — ABNORMAL LOW (ref 12.0–15.0)
Hemoglobin: 10.5 g/dL — ABNORMAL LOW (ref 12.0–15.0)
O2 Saturation: 92 %
O2 Saturation: 93 %
O2 Saturation: 92 %
Patient temperature: 11
Patient temperature: 98.8
Patient temperature: 97.5
Potassium: 4.4 mmol/L (ref 3.5–5.1)
Potassium: 4.4 mmol/L (ref 3.5–5.1)
Potassium: 4.2 mmol/L (ref 3.5–5.1)
Sodium: 134 mmol/L — ABNORMAL LOW (ref 135–145)
Sodium: 134 mmol/L — ABNORMAL LOW (ref 135–145)
Sodium: 135 mmol/L (ref 135–145)
TCO2: 24 mmol/L (ref 22–32)
TCO2: 24 mmol/L (ref 22–32)
TCO2: 24 mmol/L (ref 22–32)
pCO2 arterial: 27.2 mm[Hg] — ABNORMAL LOW (ref 32–48)
pCO2 arterial: <15 mm[Hg] — CL (ref 32–48)
pCO2 arterial: 28.5 mm[Hg] — ABNORMAL LOW (ref 32–48)
pH, Arterial: 7.529 — ABNORMAL HIGH (ref 7.35–7.45)
pH, Arterial: 7.916 (ref 7.35–7.45)
pH, Arterial: 7.521 — ABNORMAL HIGH (ref 7.35–7.45)
pO2, Arterial: 56 mm[Hg] — ABNORMAL LOW (ref 83–108)
pO2, Arterial: <15 mm[Hg] — CL (ref 83–108)
pO2, Arterial: 54 mm[Hg] — ABNORMAL LOW (ref 83–108)

## 2023-09-09 LAB — GLUCOSE, CAPILLARY
Glucose-Capillary: 132 mg/dL — ABNORMAL HIGH (ref 70–99)
Glucose-Capillary: 109 mg/dL — ABNORMAL HIGH (ref 70–99)
Glucose-Capillary: 108 mg/dL — ABNORMAL HIGH (ref 70–99)
Glucose-Capillary: 135 mg/dL — ABNORMAL HIGH (ref 70–99)
Glucose-Capillary: 132 mg/dL — ABNORMAL HIGH (ref 70–99)
Glucose-Capillary: 123 mg/dL — ABNORMAL HIGH (ref 70–99)

## 2023-09-09 LAB — TYPE AND SCREEN
ABO/RH(D): O POS
Antibody Screen: NEGATIVE
Unit division: 0

## 2023-09-09 LAB — RENAL FUNCTION PANEL
Albumin: 2.3 g/dL — ABNORMAL LOW (ref 3.5–5.0)
Anion gap: 16 — ABNORMAL HIGH (ref 5–15)
BUN: 20 mg/dL (ref 8–23)
CO2: 20 mmol/L — ABNORMAL LOW (ref 22–32)
Calcium: 7.5 mg/dL — ABNORMAL LOW (ref 8.9–10.3)
Chloride: 99 mmol/L (ref 98–111)
Creatinine, Ser: 1.9 mg/dL — ABNORMAL HIGH (ref 0.44–1.00)
GFR, Estimated: 29 mL/min — ABNORMAL LOW (ref >60)
Glucose, Bld: 117 mg/dL — ABNORMAL HIGH (ref 70–99)
Phosphorus: 4.3 mg/dL (ref 2.5–4.6)
Potassium: 4.9 mmol/L (ref 3.5–5.1)
Sodium: 135 mmol/L (ref 135–145)

## 2023-09-09 LAB — TRIGLYCERIDES: Triglycerides: 321 mg/dL — ABNORMAL HIGH (ref <150)

## 2023-09-09 LAB — PROTIME-INR
INR: 3.1 — ABNORMAL HIGH (ref 0.8–1.2)
Prothrombin Time: 32.5 s — ABNORMAL HIGH (ref 11.4–15.2)

## 2023-09-09 MED ORDER — VANCOMYCIN 50 MG/ML ORAL SOLUTION
125.0000 mg | Freq: Four times a day (QID) | ORAL | Status: DC
  Administered 2023-09-09 – 2023-09-10 (×4): 125 mg
  Filled 2023-09-09 (×5): qty 2.5

## 2023-09-09 MED ORDER — CARMEX CLASSIC LIP BALM EX OINT
TOPICAL_OINTMENT | CUTANEOUS | Status: DC | PRN
  Filled 2023-09-09: qty 10

## 2023-09-09 MED ORDER — FUROSEMIDE 10 MG/ML IJ SOLN
80.0000 mg | Freq: Once | INTRAMUSCULAR | Status: AC
  Administered 2023-09-09: 80 mg via INTRAVENOUS

## 2023-09-09 MED ORDER — FOLIC ACID 1 MG PO TABS
1.0000 mg | ORAL_TABLET | Freq: Every day | ORAL | Status: DC
  Administered 2023-09-10 – 2023-09-12 (×3): 1 mg
  Filled 2023-09-09 (×3): qty 1

## 2023-09-09 MED ORDER — FUROSEMIDE 10 MG/ML IJ SOLN
INTRAMUSCULAR | Status: AC
  Filled 2023-09-09: qty 8

## 2023-09-09 NOTE — Progress Notes (Signed)
eLink Physician-Brief Progress Note Patient Name: Carol Rowe DOB: [birth year] MRN: [id number]   Date of Service  09/09/2023  HPI/Events of Note    eICU Interventions     ABG 7.9 Pco2 15 PO2 15  Will repeat      [facility] 09/09/2023, 6:55 AM

## 2023-09-09 NOTE — Progress Notes (Addendum)
NAME:  Carol Rowe, MRN:  [id number], DOB:  07-Feb-1956, LOS: 2 ADMISSION DATE:  09/07/2023, CONSULTATION DATE:  09/07/23 REFERRING MD:  Deretha Emory - [facility] CHIEF COMPLAINT:  generalized weakness   History of Present Illness:  67 yo F PMH etoh cirrhosis, COPD, HTN who was recently admitted 07/2023 for septic shock 2/2 aspiration PNA requiring ETT CRRT pressors (then dc to SNF, then home) who presented to [facility] ED 11/29 with CC generalized weakness. Reportedly tested + for COVID 11/23 and has felt poorly since. Associated poor appetite, reduced PO intake. Also endorsed abd pain x several weeks (n.b during 07/2023 admission there was concern for colitis but Cdiff was neg, concern for ileus)  At [facility], she was reportedly dehydrated appearing and was given IVF. Her labs resulted significant leukocytosis -- WBC 55, as well as worsening LA 5.7 to 6.2, as well as new thrombocytopenia 131 and coagulopathy INR 3.7 so was started on broad abx for sepsis. Her BP ultimately down-trended and she was started on pressors peripherally    PCCM  is called to admit to [facility] campus in this setting  Have requested CT a/p , GI panel, UA, PO vanc in interim  Pertinent  Medical History  Aspiration PNA Septic shock AKI requiring CRRT Alcoholic cirrhosis HTN COPD   Significant Hospital Events: Including procedures, antibiotic start and stop dates in addition to other pertinent events   11/29 [facility] ED to PCCM at [facility] campus.   Interim History / Subjective:  No acute events overnight Levo weaning down Started on meropenem for ESBL e. Coli on blood cultures last night  Objective   Blood pressure 101/61, pulse 87, temperature 98.8 F (37.1 C), resp. rate (!) 24, height 5\' 3"  (1.6 m), weight 60.5 kg, SpO2 97%.    Vent Mode: PRVC FiO2 (%):  [40 %-60 %] 40 % Set Rate:  [24 bmp-28 bmp] 24 bmp Vt Set:  [410 mL] 410 mL PEEP:  [5 cmH20-8 cmH20] 8 cmH20 Plateau Pressure:  [13 cmH20-18 cmH20] 18 cmH20   Intake/Output  Summary (Last 24 hours) at 09/09/2023 0859 Last data filed at 09/09/2023 0700 Gross per 24 hour  Intake 4092.3 ml  Output 340 ml  Net 3752.3 ml   Filed Weights   09/07/23 1331 09/08/23 2252 09/09/23 0130  Weight: 50.3 kg 60.5 kg 60.5 kg    Examination: General: ill appearing woman, intubated, sedated HEENT: [place]/AT, moist mucous membranes, sclera anicteric Neuro: PERRL CV: rrr, s1s2, no murmurs PULM: course breath sounds GI: soft, non-tender, non-distended, BS+ Extremities: warm, 1+ edema Skin: no rashes   Resolved Hospital Problem list     Assessment & Plan:  Septic Shock ESBL E. Coli Bacteremia Pneumonia - continue vasopressor support for MAP 65 or greater - continue stress dose steroids - continue meropenem  Acute Hypoxemic Respiratory Failure Pneumonia COPD - in setting of pneumonia and shock - continue mechanical ventilatory support - continue abx as above - continue nebulizer treatments  AKI Oliguria AGMA Lactic Acidosis Mild Hyponatremia Hypocalcemia - improving Cr and UOP - give lasix 80mg  IV - stop bicarb drip - AG trending down - monitor electrolytes  Acute Metabolic Encephalopathy - in setting of sepsis  Anemia Thrombocytopenia Coagulopathy - in setting of critical illness - transfuse for hemoglobin 7g/dL or less - INR improved today  Covid Positive - continue airborne precautions  Best Practice (right click and "Reselect all SmartList Selections" daily)   Diet/type: tubefeeds DVT prophylaxis: SCDs Start: 09/07/23 1934   Pressure ulcer(s): not present  on admission  GI prophylaxis: H2B Lines: Central line Foley:  Yes, and it is still needed Code Status:  DNR Last date of multidisciplinary goals of care discussion [12/1 updated sister, DNR and she does wish to start CRRT if needed]  Labs   CBC: Recent Labs  Lab 09/07/23 1404 09/07/23 1802 09/08/23 0325 09/08/23 0542 09/08/23 1325 09/08/23 1448 09/08/23 1837 09/08/23 2219  09/09/23 0430 09/09/23 0632 09/09/23 0729  WBC 55.7*  --  55.6*  --  65.9*  --  58.2* 54.9* 49.9*  --   --   NEUTROABS 46.8*  --   --   --   --   --   --   --   --   --   --   HGB 8.7*   < > 6.4*   < > 6.9*   < > 9.7* 9.6* 10.0* 9.9* 10.2*  HCT 26.1*   < > 20.2*   < > 20.5*   < > 28.5* 27.8* 27.8* 29.0* 30.0*  MCV 106.5*  --  110.4*  --  106.2*  --  96.0 94.6 94.2  --   --   PLT 131*   < > 110*  --  81*  --  68* 60* 48*  --   --    < > = values in this interval not displayed.    Basic Metabolic Panel: Recent Labs  Lab 09/07/23 1404 09/07/23 2200 09/07/23 2210 09/08/23 0325 09/08/23 0542 09/08/23 0553 09/08/23 0925 09/08/23 1448 09/08/23 1837 09/09/23 0430 09/09/23 0632 09/09/23 0729  NA 133* 131*   < >  --    < > 130*   < > 133* 134* 135 134* 134*  K 5.8* 4.8   < >  --    < > 4.9   < > 4.6 5.2* 4.9 4.4 4.4  CL 106 105  --   --   --  105  --   --  100 99  --   --   CO2 13* 8*  --   --   --  7*  --   --  15* 20*  --   --   GLUCOSE 70 113*  --   --   --  112*  --   --  173* 117*  --   --   BUN 27* 23  --   --   --  22  --   --  20 20  --   --   CREATININE 2.21* 2.21*  --   --   --  2.33*  --   --  1.73* 1.90*  --   --   CALCIUM 7.5* 7.6*  --   --   --  7.4*  --   --  7.7* 7.5*  --   --   MG  --   --   --   --   --  1.4*  --   --   --   --   --   --   PHOS  --   --   --  4.6  --   --   --   --   --  4.3  --   --    < > = values in this interval not displayed.   GFR: Estimated Creatinine Clearance: 24.4 mL/min (A) (by C-G formula based on SCr of 1.9 mg/dL (H)). Recent Labs  Lab 09/07/23 1417 09/07/23 1608 09/07/23 1802 09/07/23 2200 09/08/23 0325 09/08/23 1325  09/08/23 1837 09/08/23 2219 09/09/23 0430  PROCALCITON  --   --  25.01  --   --   --   --   --   --   WBC  --   --   --   --  55.6* 65.9* 58.2* 54.9* 49.9*  LATICACIDVEN 5.7* 6.2*  --  >9.0* >9.0*  --   --   --   --     Liver Function Tests: Recent Labs  Lab 09/07/23 1404 09/08/23 0553 09/08/23 1837  09/09/23 0430  AST 69* 60* 59*  --   ALT 28 18 22   --   ALKPHOS 283* 238* 249*  --   BILITOT 0.8 1.3* 1.7*  --   PROT 4.2* 4.6* 4.4*  --   ALBUMIN <1.5* 2.7* 2.4* 2.3*   Recent Labs  Lab 09/07/23 2200  LIPASE 18   Recent Labs  Lab 09/07/23 1802 09/07/23 2200  AMMONIA 41* 43*    ABG    Component Value Date/Time   PHART 7.529 (H) 09/09/2023 0729   PCO2ART 27.2 (L) 09/09/2023 0729   PO2ART 56 (L) 09/09/2023 0729   HCO3 22.7 09/09/2023 0729   TCO2 24 09/09/2023 0729   ACIDBASEDEF 7.0 (H) 09/08/2023 1448   O2SAT 92 09/09/2023 0729     Coagulation Profile: Recent Labs  Lab 09/07/23 1404 09/07/23 1802 09/08/23 0533 09/09/23 0430  INR 3.7* 3.7* 4.7* 3.1*    Cardiac Enzymes: No results for input(s): "CKTOTAL", "CKMB", "CKMBINDEX", "TROPONINI" in the last 168 hours.  HbA1C: Hgb A1c MFr Bld  Date/Time Value Ref Range Status  07/30/2023 02:14 PM 5.1 4.8 - 5.6 % Final    Comment:    (NOTE) Pre diabetes:          5.7%-6.4%  Diabetes:              >6.4%  Glycemic control for   <7.0% adults with diabetes     CBG: Recent Labs  Lab 09/07/23 2308 09/08/23 1954 09/08/23 2307 09/09/23 0323 09/09/23 0714  GLUCAP 103* 154* 143* 132* 109*      Critical care time: 45 minutes    Melody Comas, MD [place] [facility] Office: [phone]   See Amion for personal pager PCCM on call pager [phone] until 7pm. Please call Elink 7p-7a. [phone]

## 2023-09-09 NOTE — Progress Notes (Signed)
eLink Physician-Brief Progress Note Patient Name: MAYLEAH HESSER DOB: June 26, 1956 MRN: [id number]   Date of Service  09/09/2023  HPI/Events of Note    eICU Interventions    recent xray ETT in place OGT below diaphragm Ok to use Raise head of bed Aspiration precaution     Massie Maroon 09/09/2023, 1:48 AM

## 2023-09-09 NOTE — Progress Notes (Signed)
eLink Physician-Brief Progress Note Patient Name: Carol Rowe DOB: [birth year] MRN: [id number]   Date of Service  09/09/2023  HPI/Events of Note  Notified of positive blood culture with gram negative rods.  Pt is already on meropenem for ESBL E. Coli.   eICU Interventions  Continue antibiotics.      Intervention Category Intermediate Interventions: Other:  Larinda Buttery 09/09/2023, 9:30 PM  2:11 AM Another bottle showing GNRs with ESBL.   Plan> Continue current antibiotics.   4:07 AM Notified of bloody bowel movement.  H&H has been stable.  Hgb this morning at 10.1 <-- 9.7.  Pt is on protonix 40mg  IV BID.   INR is also improving to 2.2 <-- 3.1.   Plan> Continue to monitor for now.

## 2023-09-09 NOTE — Progress Notes (Signed)
Elink notified of patients ABG results. New orders placed for new blood gas and to follow up with day team.

## 2023-09-09 DEATH — deceased

## 2023-09-10 DIAGNOSIS — R6521 Severe sepsis with septic shock: Secondary | ICD-10-CM | POA: Diagnosis not present

## 2023-09-10 DIAGNOSIS — A419 Sepsis, unspecified organism: Secondary | ICD-10-CM | POA: Diagnosis not present

## 2023-09-10 DIAGNOSIS — J9601 Acute respiratory failure with hypoxia: Secondary | ICD-10-CM | POA: Diagnosis not present

## 2023-09-10 DIAGNOSIS — N179 Acute kidney failure, unspecified: Secondary | ICD-10-CM | POA: Diagnosis not present

## 2023-09-10 LAB — CBC
HCT: 29.1 % — ABNORMAL LOW (ref 36.0–46.0)
Hemoglobin: 10.1 g/dL — ABNORMAL LOW (ref 12.0–15.0)
MCH: 33.4 pg (ref 26.0–34.0)
MCHC: 34.7 g/dL (ref 30.0–36.0)
MCV: 96.4 fL (ref 80.0–100.0)
Platelets: 33 10*3/uL — ABNORMAL LOW (ref 150–400)
RBC: 3.02 MIL/uL — ABNORMAL LOW (ref 3.87–5.11)
RDW: 23.3 % — ABNORMAL HIGH (ref 11.5–15.5)
WBC: 39.6 10*3/uL — ABNORMAL HIGH (ref 4.0–10.5)
nRBC: 0.5 % — ABNORMAL HIGH (ref 0.0–0.2)

## 2023-09-10 LAB — PROTIME-INR
INR: 2.2 — ABNORMAL HIGH (ref 0.8–1.2)
Prothrombin Time: 24.8 s — ABNORMAL HIGH (ref 11.4–15.2)

## 2023-09-10 LAB — BLOOD CULTURE ID PANEL (REFLEXED) - BCID2

## 2023-09-10 LAB — GLUCOSE, CAPILLARY
Glucose-Capillary: 73 mg/dL (ref 70–99)
Glucose-Capillary: 128 mg/dL — ABNORMAL HIGH (ref 70–99)
Glucose-Capillary: 118 mg/dL — ABNORMAL HIGH (ref 70–99)
Glucose-Capillary: 111 mg/dL — ABNORMAL HIGH (ref 70–99)
Glucose-Capillary: 113 mg/dL — ABNORMAL HIGH (ref 70–99)
Glucose-Capillary: 115 mg/dL — ABNORMAL HIGH (ref 70–99)

## 2023-09-10 LAB — RENAL FUNCTION PANEL
Albumin: 2 g/dL — ABNORMAL LOW (ref 3.5–5.0)
Anion gap: 14 (ref 5–15)
BUN: 22 mg/dL (ref 8–23)
CO2: 21 mmol/L — ABNORMAL LOW (ref 22–32)
Calcium: 7.3 mg/dL — ABNORMAL LOW (ref 8.9–10.3)
Chloride: 100 mmol/L (ref 98–111)
Creatinine, Ser: 2.12 mg/dL — ABNORMAL HIGH (ref 0.44–1.00)
GFR, Estimated: 25 mL/min — ABNORMAL LOW (ref >60)
Glucose, Bld: 125 mg/dL — ABNORMAL HIGH (ref 70–99)
Phosphorus: 4.9 mg/dL — ABNORMAL HIGH (ref 2.5–4.6)
Potassium: 4.5 mmol/L (ref 3.5–5.1)
Sodium: 135 mmol/L (ref 135–145)

## 2023-09-10 LAB — CULTURE, BLOOD (ROUTINE X 2): Culture  Setup Time: NO GROWTH

## 2023-09-10 LAB — MAGNESIUM: Magnesium: 1.9 mg/dL (ref 1.7–2.4)

## 2023-09-10 MED ORDER — VITAL AF 1.2 CAL PO LIQD
1000.0000 mL | ORAL | Status: DC
  Administered 2023-09-10 – 2023-09-11 (×2): 1000 mL

## 2023-09-10 MED ORDER — SODIUM CHLORIDE 0.9 % IV SOLN
1.0000 g | Freq: Two times a day (BID) | INTRAVENOUS | Status: DC
  Administered 2023-09-10 – 2023-09-12 (×5): 1 g via INTRAVENOUS
  Filled 2023-09-10 (×5): qty 20

## 2023-09-10 MED ORDER — BUDESONIDE 0.5 MG/2ML IN SUSP
0.5000 mg | Freq: Two times a day (BID) | RESPIRATORY_TRACT | Status: DC
  Administered 2023-09-10 – 2023-09-12 (×5): 0.5 mg via RESPIRATORY_TRACT
  Filled 2023-09-10 (×5): qty 2

## 2023-09-10 MED ORDER — ALPRAZOLAM 0.5 MG PO TABS
0.5000 mg | ORAL_TABLET | Freq: Two times a day (BID) | ORAL | Status: DC
  Administered 2023-09-10 – 2023-09-11 (×3): 0.5 mg
  Filled 2023-09-10 (×3): qty 1

## 2023-09-10 MED ORDER — CALCIUM GLUCONATE-NACL 1-0.675 GM/50ML-% IV SOLN
1.0000 g | Freq: Once | INTRAVENOUS | Status: AC
  Administered 2023-09-10: 1000 mg via INTRAVENOUS
  Filled 2023-09-10: qty 50

## 2023-09-10 MED ORDER — MUPIROCIN 2 % EX OINT
1.0000 | TOPICAL_OINTMENT | Freq: Two times a day (BID) | CUTANEOUS | Status: DC
  Administered 2023-09-10 – 2023-09-12 (×5): 1 via NASAL
  Filled 2023-09-10: qty 22

## 2023-09-10 MED ORDER — ARFORMOTEROL TARTRATE 15 MCG/2ML IN NEBU
15.0000 ug | INHALATION_SOLUTION | Freq: Two times a day (BID) | RESPIRATORY_TRACT | Status: DC
  Administered 2023-09-10 – 2023-09-12 (×5): 15 ug via RESPIRATORY_TRACT
  Filled 2023-09-10 (×3): qty 2

## 2023-09-10 NOTE — TOC CM/SW Note (Signed)
Patient admitted for Septic shock due to ESBL E. Coli. Currently intubated and on levophed, vasopressin and iv antibiotic.  TOC will continue to follow for needs.

## 2023-09-10 NOTE — Progress Notes (Signed)
Notified Elink of bloody bowel movement. CBC sent and Hgb is stable at 10.1. Will continue to trend H&H.

## 2023-09-10 NOTE — Progress Notes (Addendum)
Nutrition Follow-up  DOCUMENTATION CODES:  Severe malnutrition in context of chronic illness  INTERVENTION:  Recommend the following via OGT: Vital AF 1.2 at 55 ml/h (1320 ml per day) Start at 15 and hold at trickle. Once ok per CCM, advance by 10mL q8h to goal of 55 Provides 1584 kcal, 99 gm protein, 1071 ml free water daily When starting nutrition or dextrose containing IVF, pt will be at risk for refeeding given poor PO intake and hx of malnutrition. Monitor magnesium and phosphorus x 48 more hours, MD to replete as needed. When able to advance feeds, add Banatrol BID to add bulk to stool - safe to use in patients with C diff. MVI, thiamine, and folic acid daily for hx EtOH abuse  NUTRITION DIAGNOSIS:  Severe Malnutrition related to chronic illness (COPD, cirrhosis) as evidenced by severe muscle depletion, severe fat depletion. - new dx established 12/2  GOAL:  Patient will meet greater than or equal to 90% of their needs -progressing, TF being initiated  MONITOR:  I & O's, Vent status, Labs, Weight trends  REASON FOR ASSESSMENT:  Consult Assessment of nutrition requirement/status (malnutrition)  ASSESSMENT:  Pt with hx of HTN, COPD, cirrhosis and EtOH abuse presented to ED from home with weakness and poor PO ongoing since being dx with COVID 11/23. Admitted with sepsis.  Noted pt recently admitted from 10/18-11/2 and required ICU stay and CRRT. Pt had been discharged to SNF for rehab but had returned home prior to this admission.  11/29 - presented to [facility] ED, transferred to [facility] for ICU level care 11/30 - intubated   Pt resting in bed at the time of assessment. No family present to provide a nutrition hx. Pt very frail and deconditioned on physical assessment with signs of severe fat and muscle wasting. Significant edema to the abdomen and extremities likely masking additional depletions. OGT in place with side port in the gastric body.   Discussed with DO, ok to  initiate trickle feeds. Phosphorus, Mg, and K being checked daily per MD. Pressor needs improved since last assessment but pt still requiring x 2. Once able, recommend advancing TF to goal slowly to continue monitoring for tolerance and signs of refeeding.   MV: 9.6 L/min Temp (24hrs), Avg:98.4 F (36.9 C), Min:96.8 F (36 C), Max:100.8 F (38.2 C) Arterial Line MAP = 84  Propofol: 7.55 ml/hr (199 kcal/d at current infusion rate)  Admit weight: 50.3 kg Current weight: 62.2 kg No recent weight hx available in chart   Intake/Output Summary (Last 24 hours) at 09/10/2023 1135 Last data filed at 09/10/2023 0900 Gross per 24 hour  Intake 1601.82 ml  Output 335 ml  Net 1266.82 ml  Net IO Since Admission: 9,082.84 mL [09/10/23 1135]  Nutritionally Relevant Medications: Scheduled Meds:  docusate  100 mg Per Tube BID   folic acid  1 mg Per Tube Daily   insulin aspart  0-9 Units Subcutaneous Q4H   lactulose  30 g Oral Daily   multivitamin with minerals  1 tablet Oral Daily   pantoprazole   40 mg Intravenous Q12H   polyethylene glycol  17 g Per Tube Daily   thiamine   100 mg Intravenous Daily   vancomycin  125 mg Per Tube Q6H   Continuous Infusions:  meropenem (MERREM) IV Stopped (09/09/23 1954)   metronidazole Stopped (09/10/23 0630)   norepinephrine (LEVOPHED) Adult infusion 11 mcg/min (09/10/23 0800)   propofol (DIPRIVAN) infusion 25 mcg/kg/min (09/10/23 0800)   vasopressin 0.03 Units/min (  09/10/23 0800)   PRN Meds: docusate sodium, polyethylene glycol  Labs Reviewed: Creatinine 2.12 Phosphorus 4.9 CBG ranges from 108-135 mg/dL over the last 24 hours HgbA1c 5.1% (10/21)  NUTRITION - FOCUSED PHYSICAL EXAM: Flowsheet Row Most Recent Value  Orbital Region Severe depletion  Upper Arm Region Mild depletion  Thoracic and Lumbar Region No depletion  [edema]  Buccal Region Severe depletion  Temple Region Severe depletion  Clavicle Bone Region Severe depletion  Clavicle and  Acromion Bone Region Severe depletion  Scapular Bone Region Severe depletion  Dorsal Hand Unable to assess  [mittens]  Patellar Region Unable to assess  [pitting edema]  Anterior Thigh Region Unable to assess  [pitting edema]  Posterior Calf Region Unable to assess  [boots]  Edema (RD Assessment) Severe  [generalized, BLE and BUE]  Hair Reviewed  Eyes Unable to assess  Mouth Reviewed  [dried blood noted on lips]  Skin Reviewed  Nails Unable to assess  [mittens]   Diet Order:   Diet Order             Diet NPO time specified Except for: Sips with Meds  Diet effective now                   EDUCATION NEEDS:  Not appropriate for education at this time  Skin:  Skin Assessment: Reviewed RN Assessment MASD to the perineum/buttocks area (6 x 2 inches)  Last BM:  12/2 - type 7 Clostridium difficile positive this admission  Height:  Ht Readings from Last 1 Encounters:  09/08/23 5\' 3"  (1.6 m)    Weight:  Wt Readings from Last 1 Encounters:  09/10/23 62.2 kg    Ideal Body Weight:  52.3 kg  BMI:  Body mass index is 24.29 kg/m.  Estimated Nutritional Needs:  Kcal:  1500-1800 kcal/d Protein:  80-100g/d Fluid:  1.5-1.8L/d    Greig Castilla, RD, LDN Registered Dietitian II RD pager # available in [facility]  After hours/weekend pager # available in [facility]

## 2023-09-10 NOTE — Progress Notes (Signed)
PHARMACY NOTE:  ANTIMICROBIAL RENAL DOSAGE ADJUSTMENT  Current antimicrobial regimen includes a mismatch between antimicrobial dosage and estimated renal function.  As per policy approved by the Pharmacy & Therapeutics and Medical Executive Committees, the antimicrobial dosage will be adjusted accordingly.  Current antimicrobial dosage:  Meropenem 1000mg  q24h   Indication: ESBL bacteremia   Renal Function:  Estimated Creatinine Clearance: 21.9 mL/min (A) (by C-G formula based on SCr of 2.12 mg/dL (H)). []      On intermittent HD, scheduled: []      On CRRT    Antimicrobial dosage has been changed to:  Meropenem 1000mg  q12h per protocol   Thank you for allowing pharmacy to be a part of this patient's care.  Estill Batten, PharmD, BCCCP  09/10/2023 9:23 AM

## 2023-09-10 NOTE — Progress Notes (Signed)
PCCM: Resident Note   NAME:  Carol Rowe, MRN:  [id number], DOB:  30-Jul-1956, LOS: 3 ADMISSION DATE:  09/07/2023, CONSULTATION DATE:  09/07/23 REFERRING MD:  Deretha Emory - [facility], CHIEF COMPLAINT:  generalized weakness   History of Present Illness:  67 yo F PMH etoh cirrhosis, COPD, HTN who was recently admitted 07/2023 for septic shock 2/2 aspiration PNA requiring ETT CRRT pressors (then dc to SNF, then home) who presented to [facility] ED 11/29 with CC generalized weakness. Reportedly tested + for COVID 11/23 and has felt poorly since. Associated poor appetite, reduced PO intake. Also endorsed abd pain x several weeks (n.b during 07/2023 admission there was concern for colitis but Cdiff was neg, concern for ileus)  At [facility], she was reportedly dehydrated appearing and was given IVF. Her labs resulted significant leukocytosis -- WBC 55, as well as worsening LA 5.7 to 6.2, as well as new thrombocytopenia 131 and coagulopathy INR 3.7 so was started on broad abx for sepsis. Her BP ultimately down-trended and she was started on pressors peripherally.    PCCM  is called to admit to [place] campus in this setting.   Pertinent  Medical History  Aspiration PNA Septic shock AKI requiring CRRT Alcoholic cirrhosis HTN COPD   Significant Hospital Events: Including procedures, antibiotic start and stop dates in addition to other pertinent events   11/29 [facility] ED to PCCM at [facility] campus.  11/30: Intubated, ESBL e. Coli on blood cultures, started on meropenem   Interim History / Subjective:    Review of Systems:   Unable to perform due to intubation and sedation   Objective   Blood pressure 101/61, pulse 98, temperature 99 F (37.2 C), resp. rate (!) 24, height 5\' 3"  (1.6 m), weight 62.2 kg, SpO2 96%.   Tmax: 100.59F   Vent Mode: PRVC FiO2 (%):  [40 %] 40 % Set Rate:  [24 bmp] 24 bmp Vt Set:  [410 mL] 410 mL PEEP:  [8 cmH20] 8 cmH20 Plateau Pressure:  [19 cmH20] 19 cmH20   Intake/Output Summary (Last 24  hours) at 09/10/2023 1114 Last data filed at 09/10/2023 0900 Gross per 24 hour  Intake 1601.82 ml  Output 335 ml  Net 1266.82 ml   Filed Weights   09/08/23 2252 09/09/23 0130 09/10/23 0159  Weight: 60.5 kg 60.5 kg 62.2 kg    Examination: Physical Exam: General:Intubated, sedated  HEENT: Dry oral mucosa, ETT present, OG tube present Cardiac: Tachycardic, regular rhythm Pulmonary: Decreased breath sounds bilaterally at the bases, on ventilator Abdominal: Soft, bowel sounds present Neuro: Sedated, unresponsive MSK: Bilateral pitting edema extending to levels of the knees Skin: Warm and dry GU: Foley catheter in place with scant amount of urine  Labs   CBC:    Latest Ref Rng & Units 09/10/2023    3:30 AM 09/09/2023   10:11 PM 09/09/2023    4:00 PM  CBC  WBC 4.0 - 10.5 K/uL 39.6  42.4  39.1   Hemoglobin 12.0 - 15.0 g/dL 78.2  9.7  9.6   Hematocrit 36.0 - 46.0 % 29.1  27.9  27.5   Platelets 150 - 400 K/uL 33  33  33     Basic Metabolic Panel:    Latest Ref Rng & Units 09/10/2023    3:30 AM 09/09/2023    4:00 PM 09/09/2023    3:52 PM  BMP  Glucose 70 - 99 mg/dL 956  213    BUN 8 - 23 mg/dL 22  19  Creatinine 0.44 - 1.00 mg/dL 9.14  7.82    Sodium 956 - 145 mmol/L 135  135  135   Potassium 3.5 - 5.1 mmol/L 4.5  4.4  4.2   Chloride 98 - 111 mmol/L 100  100    CO2 22 - 32 mmol/L 21  22    Calcium 8.9 - 10.3 mg/dL 7.3  7.2      GFR: Estimated Creatinine Clearance: 21.9 mL/min (A) (by C-G formula based on SCr of 2.12 mg/dL (H)). Recent Labs  Lab 09/07/23 1417 09/07/23 1608 09/07/23 1802 09/07/23 2200 09/08/23 0325 09/08/23 1325 09/09/23 1130 09/09/23 1600 09/09/23 2211 09/10/23 0330  PROCALCITON  --   --  25.01  --   --   --   --   --   --   --   WBC  --   --   --   --  55.6*   < > 42.6* 39.1* 42.4* 39.6*  LATICACIDVEN 5.7* 6.2*  --  >9.0* >9.0*  --   --   --   --   --    < > = values in this interval not displayed.    Liver Function Tests: Recent Labs  Lab  09/08/23 0553 09/08/23 1837 09/09/23 0430 09/09/23 1600 09/10/23 0330  AST 60* 59*  --  39  --   ALT 18 22  --  24  --   ALKPHOS 238* 249*  --  318*  --   BILITOT 1.3* 1.7*  --  2.0*  --   PROT 4.6* 4.4*  --  4.1*  --   ALBUMIN 2.7* 2.4* 2.3* 2.1* 2.0*   Recent Labs  Lab 09/07/23 2200  LIPASE 18   Recent Labs  Lab 09/07/23 1802 09/07/23 2200  AMMONIA 41* 43*   ABG    Component Value Date/Time   PHART 7.521 (H) 09/09/2023 1552   PCO2ART 28.5 (L) 09/09/2023 1552   PO2ART 54 (L) 09/09/2023 1552   HCO3 23.4 09/09/2023 1552   TCO2 24 09/09/2023 1552   ACIDBASEDEF 7.0 (H) 09/08/2023 1448   O2SAT 92 09/09/2023 1552    Coagulation Profile: Recent Labs  Lab 09/08/23 0533 09/09/23 0430 09/10/23 0330  INR 4.7* 3.1* 2.2*   Blood Culture: ESBL E. Coli   Resolved Hospital Problem list     Assessment & Plan:  Septic Shock ESBL E. Coli Bacteremia -Continue vasopressors for MAP greater than 65, wean as tolerated  -Continue meropenem  -Continue stress dose steroids, cortisone 100 mg IV every 8 hours, til vasopressors are d/c -C. difficile toxin is negative -Continue ventilatory lung protection settings, patient was compensating -CODE STATUS changed to DNR, may intubate -decrease propofol, RAAS 0-1  -start weaning sedation    Acute Hypoxemic Respiratory Failure Pneumonia 2/2 Covid COPD -Continue supportive treatment, ventilatory support -Continue duo-nebs  -Continue brovana and pulmicort   Acute metabolic encephalopathy  2/2 sepsis, unable to assess cognition due to sedation   AKI Oliguria AGMA Lactic Acidosis Mild Hyponatremia Hypocalcemia Scr has up-trended to 2.12, UOP is 320 cc within the past 24 hours  -No urgent indication for CRRT at this time, family does not provide consent for CRRT if it is indicated   Hypoalbuminemia Liver cirrhosis  Diarrhea  Will start trickle feeds this afternoon  -d/c miralax  -continue lactulose    Thrombocytopenia Coagulopathy Anemia  Hgb improved to 10.1, platelet count has down trended to 33, INR improved to 2.2  -transfuse if hgb<7.0 -hold off on anticoagulation for DVT  prophylaxis (SCDs ordered)  Best Practice (right click and "Reselect all SmartList Selections" daily)   Diet/type: NPO DVT prophylaxis: SCDs GI prophylaxis: PPI Lines: L internal jugular  Foley:  Indwelling catheter  Code Status:  DNR- may intubate

## 2023-09-10 NOTE — Progress Notes (Signed)
eLink Physician-Brief Progress Note Patient Name: Carol Rowe DOB: 05/02/1956 MRN: [id number]   Date of Service  09/10/2023  HPI/Events of Note  67 year old woman with cirrhosis admitted with septic shock, positive COVID test 11/23 found to have ESBL E. coli bacteremia.  Currently mechanically ventilated on dual pressor support.  Request for a.m. ABG in the setting of an arterial line.  Stable/downtrending vasopressor requirements, alkalemia noted on last ABG.  eICU Interventions  Reduce respiratory rate to 16.  Given clinical stability, no indication for blood gas at this time.   4098 -  Plt 26, continue observation  Intervention Category Minor Interventions: Routine modifications to care plan (e.g. PRN medications for pain, fever)  Carol Rowe 09/10/2023, 11:41 PM

## 2023-09-10 NOTE — Progress Notes (Signed)
RT placed patient on PS/CPAP per MD. Patient went into backup mode with apnea. RT placed patient back on FS PRVC settings.RT will make RN aware.

## 2023-09-11 DIAGNOSIS — B9629 Other Escherichia coli [E. coli] as the cause of diseases classified elsewhere: Secondary | ICD-10-CM | POA: Diagnosis not present

## 2023-09-11 DIAGNOSIS — A419 Sepsis, unspecified organism: Secondary | ICD-10-CM | POA: Diagnosis not present

## 2023-09-11 DIAGNOSIS — R6521 Severe sepsis with septic shock: Secondary | ICD-10-CM | POA: Diagnosis not present

## 2023-09-11 DIAGNOSIS — N390 Urinary tract infection, site not specified: Secondary | ICD-10-CM | POA: Diagnosis not present

## 2023-09-11 LAB — CBC
HCT: 27.3 % — ABNORMAL LOW (ref 36.0–46.0)
Hemoglobin: 9.1 g/dL — ABNORMAL LOW (ref 12.0–15.0)
MCH: 32 pg (ref 26.0–34.0)
MCHC: 33.3 g/dL (ref 30.0–36.0)
MCV: 96.1 fL (ref 80.0–100.0)
Platelets: 26 10*3/uL — CL (ref 150–400)
RBC: 2.84 MIL/uL — ABNORMAL LOW (ref 3.87–5.11)
RDW: 22.5 % — ABNORMAL HIGH (ref 11.5–15.5)
WBC: 33.2 10*3/uL — ABNORMAL HIGH (ref 4.0–10.5)
nRBC: 0.7 % — ABNORMAL HIGH (ref 0.0–0.2)

## 2023-09-11 LAB — GLUCOSE, CAPILLARY
Glucose-Capillary: 111 mg/dL — ABNORMAL HIGH (ref 70–99)
Glucose-Capillary: 113 mg/dL — ABNORMAL HIGH (ref 70–99)
Glucose-Capillary: 120 mg/dL — ABNORMAL HIGH (ref 70–99)
Glucose-Capillary: 122 mg/dL — ABNORMAL HIGH (ref 70–99)
Glucose-Capillary: 124 mg/dL — ABNORMAL HIGH (ref 70–99)
Glucose-Capillary: 124 mg/dL — ABNORMAL HIGH (ref 70–99)
Glucose-Capillary: 125 mg/dL — ABNORMAL HIGH (ref 70–99)

## 2023-09-11 LAB — RENAL FUNCTION PANEL
Albumin: 1.7 g/dL — ABNORMAL LOW (ref 3.5–5.0)
Anion gap: 10 (ref 5–15)
BUN: 23 mg/dL (ref 8–23)
CO2: 23 mmol/L (ref 22–32)
Calcium: 7.4 mg/dL — ABNORMAL LOW (ref 8.9–10.3)
Chloride: 104 mmol/L (ref 98–111)
Creatinine, Ser: 2.35 mg/dL — ABNORMAL HIGH (ref 0.44–1.00)
GFR, Estimated: 22 mL/min — ABNORMAL LOW (ref >60)
Glucose, Bld: 140 mg/dL — ABNORMAL HIGH (ref 70–99)
Phosphorus: 4.7 mg/dL — ABNORMAL HIGH (ref 2.5–4.6)
Potassium: 3.8 mmol/L (ref 3.5–5.1)
Sodium: 137 mmol/L (ref 135–145)

## 2023-09-11 LAB — PROTIME-INR
INR: 2.4 — ABNORMAL HIGH (ref 0.8–1.2)
Prothrombin Time: 26.7 s — ABNORMAL HIGH (ref 11.4–15.2)

## 2023-09-11 LAB — MAGNESIUM: Magnesium: 1.9 mg/dL (ref 1.7–2.4)

## 2023-09-11 MED ORDER — ALBUMIN HUMAN 5 % IV SOLN
12.5000 g | Freq: Once | INTRAVENOUS | Status: AC
  Administered 2023-09-11: 12.5 g via INTRAVENOUS
  Filled 2023-09-11: qty 250

## 2023-09-11 MED ORDER — FUROSEMIDE 10 MG/ML IJ SOLN
80.0000 mg | Freq: Once | INTRAMUSCULAR | Status: AC
  Administered 2023-09-11: 80 mg via INTRAVENOUS
  Filled 2023-09-11: qty 8

## 2023-09-11 MED ORDER — ALPRAZOLAM 0.5 MG PO TABS: 0.5000 mg | ORAL_TABLET | Freq: Two times a day (BID) | ORAL | Status: DC | PRN

## 2023-09-11 MED ORDER — VITAL AF 1.2 CAL PO LIQD
1000.0000 mL | ORAL | Status: DC
  Administered 2023-09-11 – 2023-09-12 (×2): 1000 mL

## 2023-09-11 NOTE — Progress Notes (Signed)
Brief Nutrition Support Note  Pt tolerating TF at trickle rate at this time. On weaning trial this AM, but having increased RR. Resident not confident extubation will occur. Ok to begin titrating TF to goal slowly. Discussed with RN.    INTERVENTION:  Recommend the following via OGT: Vital AF 1.2 at 55 ml/h (1320 ml per day) Begin to advance by 10mL q8h to goal of 55 Provides 1584 kcal, 99 gm protein, 1071 ml free water daily Pt at risk for refeeding given poor PO intake and hx of malnutrition. Monitor magnesium and phosphorus x 24 more hours, MD to replete as needed. Add Banatrol BID to add bulk to stool - safe to use in patients with C diff. MVI, thiamine, and folic acid daily for hx EtOH abuse    Greig Castilla, RD, LDN Registered Dietitian II RD pager # available in [facility]  After hours/weekend pager # available in [facility]

## 2023-09-11 NOTE — Progress Notes (Signed)
PCCM: Resident Note   NAME:  Carol Rowe, MRN:  [id number], DOB:  26-Sep-1956, LOS: 4 ADMISSION DATE:  09/07/2023, CONSULTATION DATE:  09/07/23 REFERRING MD:  Deretha Emory - [facility], CHIEF COMPLAINT:  generalized weakness   History of Present Illness:  67 yo F PMH etoh cirrhosis, COPD, HTN who was recently admitted 07/2023 for septic shock 2/2 aspiration PNA requiring ETT CRRT pressors (then dc to SNF, then home) who presented to [facility] ED 11/29 with CC generalized weakness. Reportedly tested + for COVID 11/23 and has felt poorly since. Associated poor appetite, reduced PO intake. Also endorsed abd pain x several weeks (n.b during 07/2023 admission there was concern for colitis but Cdiff was neg, concern for ileus)  At [facility], she was reportedly dehydrated appearing and was given IVF. Her labs resulted significant leukocytosis -- WBC 55, as well as worsening LA 5.7 to 6.2, as well as new thrombocytopenia 131 and coagulopathy INR 3.7 so was started on broad abx for sepsis. Her BP ultimately down-trended and she was started on pressors peripherally.    PCCM  is called to admit to [place] campus in this setting.   Pertinent  Medical History  Aspiration PNA Septic shock AKI requiring CRRT Alcoholic cirrhosis HTN COPD   Significant Hospital Events: Including procedures, antibiotic start and stop dates in addition to other pertinent events   11/29 [facility] ED to PCCM at [facility] campus.  11/30: Intubated, ESBL e. Coli on blood cultures, started on meropenem   Interim History / Subjective:  Levophed decreased to , vasopressin was discontinued.   Review of Systems:   Unable to perform due to intubation and sedation   Objective   Blood pressure 101/61, pulse (!) 111, temperature 98.4 F (36.9 C), resp. rate (!) 22, height 5\' 3"  (1.6 m), weight 62.2 kg, SpO2 93%.   Urine output: 270cc  Vent Mode: CPAP;PSV FiO2 (%):  [40 %] 40 % Set Rate:  [16 bmp-24 bmp] 16 bmp Vt Set:  [410 mL] 410 mL PEEP:  [5 cmH20] 5  cmH20 Pressure Support:  [10 cmH20-15 cmH20] 15 cmH20 Plateau Pressure:  [16 cmH20-18 cmH20] 16 cmH20   Intake/Output Summary (Last 24 hours) at 09/11/2023 1018 Last data filed at 09/11/2023 0800 Gross per 24 hour  Intake 832.29 ml  Output 265 ml  Net 567.29 ml   Filed Weights   09/08/23 2252 09/09/23 0130 09/10/23 0159  Weight: 60.5 kg 60.5 kg 62.2 kg    Examination: Physical Exam: General:laying in bed HEENT: ETT in place, dry mucous membrane  Cardiac: tachycardic, regular rhythm  Pulmonary: decreased bibasilar breath sounds bilaterally, no wheezes or rhonchi appreciated Abdominal:soft, bowel sounds present  Neuro: neither alert or oriented, will open eyes to sternal rub, not voice, does not withdraw extremities to pain, RASS of -4 this morning  MSK: pitting edema present bilaterally  Skin:warm and dry  GU: foley catheter in place   Labs   CBC:    Latest Ref Rng & Units 09/11/2023    3:29 AM 09/10/2023    3:30 AM 09/09/2023   10:11 PM  CBC  WBC 4.0 - 10.5 K/uL 33.2  39.6  42.4   Hemoglobin 12.0 - 15.0 g/dL 9.1  40.9  9.7   Hematocrit 36.0 - 46.0 % 27.3  29.1  27.9   Platelets 150 - 400 K/uL 26  33  33     Basic Metabolic Panel:    Latest Ref Rng & Units 09/11/2023    3:29 AM 09/10/2023  3:30 AM 09/09/2023    4:00 PM  BMP  Glucose 70 - 99 mg/dL 629  528  413   BUN 8 - 23 mg/dL 23  22  19    Creatinine 0.44 - 1.00 mg/dL 2.44  0.10  2.72   Sodium 135 - 145 mmol/L 137  135  135   Potassium 3.5 - 5.1 mmol/L 3.8  4.5  4.4   Chloride 98 - 111 mmol/L 104  100  100   CO2 22 - 32 mmol/L 23  21  22    Calcium 8.9 - 10.3 mg/dL 7.4  7.3  7.2     GFR: Estimated Creatinine Clearance: 19.7 mL/min (A) (by C-G formula based on SCr of 2.35 mg/dL (H)). Recent Labs  Lab 09/07/23 1417 09/07/23 1608 09/07/23 1802 09/07/23 2200 09/08/23 0325 09/08/23 1325 09/09/23 1600 09/09/23 2211 09/10/23 0330 09/11/23 0329  PROCALCITON  --   --  25.01  --   --   --   --   --   --   --    WBC  --   --   --   --  55.6*   < > 39.1* 42.4* 39.6* 33.2*  LATICACIDVEN 5.7* 6.2*  --  >9.0* >9.0*  --   --   --   --   --    < > = values in this interval not displayed.    Liver Function Tests: Recent Labs  Lab 09/08/23 0553 09/08/23 1837 09/09/23 0430 09/09/23 1600 09/10/23 0330 09/11/23 0329  AST 60* 59*  --  39  --   --   ALT 18 22  --  24  --   --   ALKPHOS 238* 249*  --  318*  --   --   BILITOT 1.3* 1.7*  --  2.0*  --   --   PROT 4.6* 4.4*  --  4.1*  --   --   ALBUMIN 2.7* 2.4*   < > 2.1* 2.0* 1.7*   < > = values in this interval not displayed.   Recent Labs  Lab 09/07/23 2200  LIPASE 18   Recent Labs  Lab 09/07/23 1802 09/07/23 2200  AMMONIA 41* 43*   ABG    Component Value Date/Time   PHART 7.521 (H) 09/09/2023 1552   PCO2ART 28.5 (L) 09/09/2023 1552   PO2ART 54 (L) 09/09/2023 1552   HCO3 23.4 09/09/2023 1552   TCO2 24 09/09/2023 1552   ACIDBASEDEF 7.0 (H) 09/08/2023 1448   O2SAT 92 09/09/2023 1552    Coagulation Profile: Recent Labs  Lab 09/09/23 0430 09/10/23 0330 09/11/23 0329  INR 3.1* 2.2* 2.4*   Blood Culture: ESBL E. Coli   Resolved Hospital Problem list     Assessment & Plan:  Septic Shock ESBL E. Coli Bacteremia -Continue vasopressors for MAP greater than 65, wean as tolerated  -Continue meropenem  -Continue stress dose steroids, cortisone 100 mg IV every 8 hours, til vasopressors are d/c, possibly taper this afternoon  -CODE STATUS changed to DNR, may intubate -d/c sedation, continue weaning vent   Acute Hypoxemic Respiratory Failure Pneumonia 2/2 Covid COPD -Continue supportive treatment, ventilatory support -Continue duo-nebs  -Continue brovana and pulmicort  -wean off vent   Acute metabolic encephalopathy  2/2 sepsis, unable to assess cognition due to prolonged effects of sedation 2/2 liver and renal failure   AKI Oliguria AGMA Lactic Acidosis Mild Hyponatremia Hypocalcemia Scr has up-trended to 2.35, UOP is  270 cc within the past 24 hours  -  No urgent indication for CRRT at this time, family does not provide consent for CRRT if it is indicated  -administer albumin and lasix 80mg  this morning   Hypoalbuminemia Liver cirrhosis  Diarrhea  Increase tube feeds today -continue lactulose  -albumin 5% today   Thrombocytopenia Coagulopathy Anemia  Hgb 9.1, platelet count has down trended to 26, INR improved to 2.4  -transfuse if hgb<7.0 -hold off on anticoagulation for DVT prophylaxis (SCDs ordered)  Best Practice (right click and "Reselect all SmartList Selections" daily)   Diet/type: NPO DVT prophylaxis: SCDs GI prophylaxis: PPI Lines: L internal jugular  Foley:  Indwelling catheter  Code Status:  DNR- may intubate

## 2023-09-11 NOTE — Progress Notes (Signed)
RT placed patient on CPAP/PSV 10/5 @0750 . Patient RR increased to 38 after about 8 minutes so RT increase PS to 15. Patient is currently tolerating 15/5 well at this time. RT will continue to monitor.

## 2023-09-12 ENCOUNTER — Inpatient Hospital Stay (HOSPITAL_COMMUNITY): Payer: Medicare HMO

## 2023-09-12 DIAGNOSIS — N179 Acute kidney failure, unspecified: Secondary | ICD-10-CM | POA: Diagnosis not present

## 2023-09-12 DIAGNOSIS — G9341 Metabolic encephalopathy: Secondary | ICD-10-CM

## 2023-09-12 DIAGNOSIS — R6521 Severe sepsis with septic shock: Secondary | ICD-10-CM | POA: Diagnosis not present

## 2023-09-12 DIAGNOSIS — A419 Sepsis, unspecified organism: Secondary | ICD-10-CM | POA: Diagnosis not present

## 2023-09-12 LAB — POCT I-STAT 7, (LYTES, BLD GAS, ICA,H+H)
Acid-base deficit: 8 mmol/L — ABNORMAL HIGH (ref 0.0–2.0)
Acid-base deficit: 5 mmol/L — ABNORMAL HIGH (ref 0.0–2.0)
Bicarbonate: 22.7 mmol/L (ref 20.0–28.0)
Bicarbonate: 21.3 mmol/L (ref 20.0–28.0)
Calcium, Ion: 1.14 mmol/L — ABNORMAL LOW (ref 1.15–1.40)
Calcium, Ion: 1.1 mmol/L — ABNORMAL LOW (ref 1.15–1.40)
HCT: 32 % — ABNORMAL LOW (ref 36.0–46.0)
HCT: 29 % — ABNORMAL LOW (ref 36.0–46.0)
Hemoglobin: 10.9 g/dL — ABNORMAL LOW (ref 12.0–15.0)
Hemoglobin: 9.9 g/dL — ABNORMAL LOW (ref 12.0–15.0)
O2 Saturation: 67 %
O2 Saturation: 77 %
Patient temperature: 36.9
Patient temperature: 36.8
Potassium: 4.1 mmol/L (ref 3.5–5.1)
Potassium: 3.8 mmol/L (ref 3.5–5.1)
Sodium: 139 mmol/L (ref 135–145)
Sodium: 139 mmol/L (ref 135–145)
TCO2: 25 mmol/L (ref 22–32)
TCO2: 23 mmol/L (ref 22–32)
pCO2 arterial: 72.7 mm[Hg] (ref 32–48)
pCO2 arterial: 41.2 mm[Hg] (ref 32–48)
pH, Arterial: 7.102 — CL (ref 7.35–7.45)
pH, Arterial: 7.321 — ABNORMAL LOW (ref 7.35–7.45)
pO2, Arterial: 48 mm[Hg] — ABNORMAL LOW (ref 83–108)
pO2, Arterial: 45 mm[Hg] — ABNORMAL LOW (ref 83–108)

## 2023-09-12 LAB — RENAL FUNCTION PANEL
Albumin: 2.1 g/dL — ABNORMAL LOW (ref 3.5–5.0)
Anion gap: 14 (ref 5–15)
BUN: 30 mg/dL — ABNORMAL HIGH (ref 8–23)
CO2: 20 mmol/L — ABNORMAL LOW (ref 22–32)
Calcium: 7.8 mg/dL — ABNORMAL LOW (ref 8.9–10.3)
Chloride: 104 mmol/L (ref 98–111)
Creatinine, Ser: 2.67 mg/dL — ABNORMAL HIGH (ref 0.44–1.00)
GFR, Estimated: 19 mL/min — ABNORMAL LOW (ref >60)
Glucose, Bld: 132 mg/dL — ABNORMAL HIGH (ref 70–99)
Phosphorus: 3.8 mg/dL (ref 2.5–4.6)
Potassium: 3.8 mmol/L (ref 3.5–5.1)
Sodium: 138 mmol/L (ref 135–145)

## 2023-09-12 LAB — CBC
HCT: 28.8 % — ABNORMAL LOW (ref 36.0–46.0)
Hemoglobin: 9.6 g/dL — ABNORMAL LOW (ref 12.0–15.0)
MCH: 32.3 pg (ref 26.0–34.0)
MCHC: 33.3 g/dL (ref 30.0–36.0)
MCV: 97 fL (ref 80.0–100.0)
Platelets: 35 10*3/uL — ABNORMAL LOW (ref 150–400)
RBC: 2.97 MIL/uL — ABNORMAL LOW (ref 3.87–5.11)
RDW: 22.7 % — ABNORMAL HIGH (ref 11.5–15.5)
WBC: 26.6 10*3/uL — ABNORMAL HIGH (ref 4.0–10.5)
nRBC: 1.5 % — ABNORMAL HIGH (ref 0.0–0.2)

## 2023-09-12 LAB — GLUCOSE, CAPILLARY
Glucose-Capillary: 119 mg/dL — ABNORMAL HIGH (ref 70–99)
Glucose-Capillary: 119 mg/dL — ABNORMAL HIGH (ref 70–99)
Glucose-Capillary: 161 mg/dL — ABNORMAL HIGH (ref 70–99)
Glucose-Capillary: 148 mg/dL — ABNORMAL HIGH (ref 70–99)

## 2023-09-12 LAB — PROTIME-INR
INR: 2.3 — ABNORMAL HIGH (ref 0.8–1.2)
Prothrombin Time: 25.2 s — ABNORMAL HIGH (ref 11.4–15.2)

## 2023-09-12 LAB — MAGNESIUM: Magnesium: 1.9 mg/dL (ref 1.7–2.4)

## 2023-09-12 MED ORDER — GLYCOPYRROLATE 0.2 MG/ML IJ SOLN: 0.2000 mg | INTRAMUSCULAR | Status: DC | PRN

## 2023-09-12 MED ORDER — METOPROLOL TARTRATE 5 MG/5ML IV SOLN
5.0000 mg | Freq: Once | INTRAVENOUS | Status: AC
  Administered 2023-09-12: 5 mg via INTRAVENOUS
  Filled 2023-09-12: qty 5

## 2023-09-12 MED ORDER — ALBUMIN HUMAN 5 % IV SOLN
12.5000 g | Freq: Once | INTRAVENOUS | Status: AC
  Administered 2023-09-12: 12.5 g via INTRAVENOUS
  Filled 2023-09-12: qty 250

## 2023-09-12 MED ORDER — LACTULOSE 10 GM/15ML PO SOLN: 20.0000 g | Freq: Every day | ORAL | Status: DC

## 2023-09-12 MED ORDER — FENTANYL 2500MCG IN NS 250ML (10MCG/ML) PREMIX INFUSION
INTRAVENOUS | Status: AC
  Filled 2023-09-12: qty 250

## 2023-09-12 MED ORDER — LACTULOSE 10 GM/15ML PO SOLN
20.0000 g | Freq: Two times a day (BID) | ORAL | Status: DC
  Administered 2023-09-12: 20 g

## 2023-09-12 MED ORDER — FENTANYL 2500MCG IN NS 250ML (10MCG/ML) PREMIX INFUSION
0.0000 ug/h | INTRAVENOUS | Status: DC
  Administered 2023-09-12: 50 ug/h via INTRAVENOUS

## 2023-09-12 MED ORDER — FENTANYL BOLUS VIA INFUSION
100.0000 ug | INTRAVENOUS | Status: DC | PRN
  Administered 2023-09-12 (×2): 100 ug via INTRAVENOUS

## 2023-09-12 MED ORDER — FENTANYL CITRATE PF 50 MCG/ML IJ SOSY: 25.0000 ug | PREFILLED_SYRINGE | INTRAMUSCULAR | Status: DC | PRN

## 2023-09-12 MED ORDER — METOPROLOL TARTRATE 12.5 MG HALF TABLET: 12.5000 mg | ORAL_TABLET | Freq: Two times a day (BID) | ORAL | Status: DC

## 2023-09-12 MED ORDER — ONDANSETRON 4 MG PO TBDP: 4.0000 mg | ORAL_TABLET | Freq: Four times a day (QID) | ORAL | Status: DC | PRN

## 2023-09-12 MED ORDER — HYDROCORTISONE SOD SUC (PF) 100 MG IJ SOLR: 50.0000 mg | Freq: Two times a day (BID) | INTRAMUSCULAR | Status: DC

## 2023-09-12 MED ORDER — ONDANSETRON HCL 4 MG/2ML IJ SOLN: 4.0000 mg | Freq: Four times a day (QID) | INTRAMUSCULAR | Status: DC | PRN

## 2023-09-12 MED ORDER — METOPROLOL TARTRATE 12.5 MG HALF TABLET
12.5000 mg | ORAL_TABLET | Freq: Two times a day (BID) | ORAL | Status: DC
  Filled 2023-09-12: qty 1

## 2023-09-12 MED ORDER — GLYCOPYRROLATE 1 MG PO TABS: 1.0000 mg | ORAL_TABLET | ORAL | Status: DC | PRN

## 2023-09-12 MED ORDER — POLYVINYL ALCOHOL 1.4 % OP SOLN: 1.0000 [drp] | Freq: Four times a day (QID) | OPHTHALMIC | Status: DC | PRN

## 2023-09-12 MED ORDER — ACETAMINOPHEN 325 MG PO TABS: 650.0000 mg | ORAL_TABLET | Freq: Four times a day (QID) | ORAL | Status: DC | PRN

## 2023-09-12 MED ORDER — ACETAMINOPHEN 650 MG RE SUPP: 650.0000 mg | Freq: Four times a day (QID) | RECTAL | Status: DC | PRN

## 2023-09-12 NOTE — Progress Notes (Signed)
PCCM: Resident Note   NAME:  Carol Rowe, MRN:  [id number], DOB:  1956/02/17, LOS: 5 ADMISSION DATE:  09/07/2023, CONSULTATION DATE:  09/07/23 REFERRING MD:  Deretha Emory - [facility], CHIEF COMPLAINT:  generalized weakness   History of Present Illness:  67 yo F PMH etoh cirrhosis, COPD, HTN who was recently admitted 07/2023 for septic shock 2/2 aspiration PNA requiring ETT CRRT pressors (then dc to SNF, then home) who presented to [facility] ED 11/29 with CC generalized weakness. Reportedly tested + for COVID 11/23 and has felt poorly since. Associated poor appetite, reduced PO intake. Also endorsed abd pain x several weeks (n.b during 07/2023 admission there was concern for colitis but Cdiff was neg, concern for ileus)  At [facility], she was reportedly dehydrated appearing and was given IVF. Her labs resulted significant leukocytosis -- WBC 55, as well as worsening LA 5.7 to 6.2, as well as new thrombocytopenia 131 and coagulopathy INR 3.7 so was started on broad abx for sepsis. Her BP ultimately down-trended and she was started on pressors peripherally.    PCCM  is called to admit to [place] campus in this setting.   Pertinent  Medical History  Aspiration PNA Septic shock AKI requiring CRRT Alcoholic cirrhosis HTN COPD   Significant Hospital Events: Including procedures, antibiotic start and stop dates in addition to other pertinent events   11/29 [facility] ED to PCCM at [facility] campus.  11/30: Intubated, ESBL e. Coli on blood cultures, started on meropenem   Interim History / Subjective:  Levophed d/c. Overnight events new ST vs Afib, was administered albumin bolus. RN reported BRBPR overnight.   Review of Systems:   Unable to perform due to intubation and sedation   Objective   Blood pressure 114/62, pulse (!) 132, temperature 98.8 F (37.1 C), resp. rate (!) 24, height 5\' 3"  (1.6 m), weight 65.5 kg, SpO2 91%.   Urine output: 115 cc  Vent Mode: PSV;CPAP FiO2 (%):  [40 %] 40 % Set Rate:  [16 bmp] 16 bmp Vt  Set:  [410 mL] 410 mL PEEP:  [5 cmH20] 5 cmH20 Pressure Support:  [5 cmH20-12 cmH20] 5 cmH20   Intake/Output Summary (Last 24 hours) at 10/03/2023 0956 Last data filed at 09/20/2023 0900 Gross per 24 hour  Intake 1464.14 ml  Output 170 ml  Net 1294.14 ml   Filed Weights   09/09/23 0130 09/10/23 0159 09/16/2023 0500  Weight: 60.5 kg 62.2 kg 65.5 kg    Examination: Physical Exam: General:ill appearing female, laying in bed  Cardiac:tachycardic, regular rhythm  Pulmonary:decreased breath sounds bilaterally, no wheezes or rhonchi auscultated bilaterally  Abdominal:bowel sounds present, distention on exam  Neuro:responds to sternal rub, will not open eyes to voice or pain  GEX:[id number] pitting edema of bilateral UE and LE  Skin:warm and dry Psych:  unable to assess   Labs   CBC:    Latest Ref Rng & Units 09/24/2023    3:30 AM 09/11/2023    3:29 AM 09/10/2023    3:30 AM  CBC  WBC 4.0 - 10.5 K/uL 26.6  33.2  39.6   Hemoglobin 12.0 - 15.0 g/dL 9.6  9.1  44.0   Hematocrit 36.0 - 46.0 % 28.8  27.3  29.1   Platelets 150 - 400 K/uL 35  26  33     Basic Metabolic Panel:    Latest Ref Rng & Units 09/16/2023    3:30 AM 09/11/2023    3:29 AM 09/10/2023    3:30 AM  BMP  Glucose 70 - 99 mg/dL 440  102  725   BUN 8 - 23 mg/dL 30  23  22    Creatinine 0.44 - 1.00 mg/dL 3.66  4.40  3.47   Sodium 135 - 145 mmol/L 138  137  135   Potassium 3.5 - 5.1 mmol/L 3.8  3.8  4.5   Chloride 98 - 111 mmol/L 104  104  100   CO2 22 - 32 mmol/L 20  23  21    Calcium 8.9 - 10.3 mg/dL 7.8  7.4  7.3     GFR: Estimated Creatinine Clearance: 19.1 mL/min (A) (by C-G formula based on SCr of 2.67 mg/dL (H)). Recent Labs  Lab 09/07/23 1417 09/07/23 1608 09/07/23 1802 09/07/23 2200 09/08/23 0325 09/08/23 1325 09/09/23 2211 09/10/23 0330 09/11/23 0329 10/06/2023 0330  PROCALCITON  --   --  25.01  --   --   --   --   --   --   --   WBC  --   --   --   --  55.6*   < > 42.4* 39.6* 33.2* 26.6*  LATICACIDVEN  5.7* 6.2*  --  >9.0* >9.0*  --   --   --   --   --    < > = values in this interval not displayed.    Liver Function Tests: Recent Labs  Lab 09/08/23 0553 09/08/23 1837 09/09/23 0430 09/09/23 1600 09/10/23 0330 09/11/23 0329 09/19/2023 0330  AST 60* 59*  --  39  --   --   --   ALT 18 22  --  24  --   --   --   ALKPHOS 238* 249*  --  318*  --   --   --   BILITOT 1.3* 1.7*  --  2.0*  --   --   --   PROT 4.6* 4.4*  --  4.1*  --   --   --   ALBUMIN 2.7* 2.4*   < > 2.1* 2.0* 1.7* 2.1*   < > = values in this interval not displayed.   Recent Labs  Lab 09/07/23 2200  LIPASE 18   Recent Labs  Lab 09/07/23 1802 09/07/23 2200  AMMONIA 41* 43*   ABG    Component Value Date/Time   PHART 7.521 (H) 09/09/2023 1552   PCO2ART 28.5 (L) 09/09/2023 1552   PO2ART 54 (L) 09/09/2023 1552   HCO3 23.4 09/09/2023 1552   TCO2 24 09/09/2023 1552   ACIDBASEDEF 7.0 (H) 09/08/2023 1448   O2SAT 92 09/09/2023 1552    Coagulation Profile: Recent Labs  Lab 09/10/23 0330 09/11/23 0329 10/06/2023 0330  INR 2.2* 2.4* 2.3*   Blood Culture: ESBL E. Coli   Resolved Hospital Problem list     Assessment & Plan:  Septic Shock ESBL E. Coli Bacteremia -Continue vasopressors for MAP greater than 65, wean as tolerated  -Continue meropenem  -Taper stress dose steroids, 50mg  q 12 -CODE STATUS changed to DNR, may intubate -d/c sedation, continue weaning vent  -Will discuss GOC with sister Burna Mortimer   Acute Hypoxemic Respiratory Failure Pneumonia 2/2 Covid COPD -Continue supportive treatment, ventilatory support -Continue duo-nebs  -Continue brovana and pulmicort  -wean off vent   Acute metabolic encephalopathy  2/2 sedation effects vs hepatic encephalopathy, unable to assess cognition due to prolonged effects of sedation 2/2 liver and renal failure   AKI Oliguria AGMA Lactic Acidosis Mild Hyponatremia Hypocalcemia Scr has up-trended to 2.67, UOP is  115cc within  the past 24 hours   Sinus  tachycardia Sustained sinus tachycardia overnight, HR up to the 130s  -Will add 5mg  lopressor IV and lopressor 12.5 mg BID   Hypoalbuminemia Liver cirrhosis  Diarrhea  Ct tube feeds today -Increase lactulose today for a goal of 3 BM daily, she is not a candidate for rectal tube due to thrombocytopenia   Thrombocytopenia Coagulopathy Anemia  Hgb 9.6, platelet count 35, INR improved to 2.3 -transfuse if hgb<7.0 -hold off on anticoagulation for DVT prophylaxis (SCDs ordered)   Best Practice (right click and "Reselect all SmartList Selections" daily)   Diet/type: NPO DVT prophylaxis: SCDs GI prophylaxis: PPI Lines: L internal jugular  Foley:  Indwelling catheter  Code Status:  DNR- may intubate

## 2023-09-12 NOTE — Procedures (Signed)
Extubation Procedure Note  Patient Details:   Name: Carol Rowe DOB: 1956-06-10 MRN: [id number]   Airway Documentation:    Vent end date: (not recorded) Vent end time: (not recorded)   Evaluation  O2 sats: currently acceptable Complications: No apparent complications Patient did not tolerate procedure well. Bilateral Breath Sounds: Diminished   No  [name] 09/25/2023, 6:39 PM Pt compassionately extubated to comfort care.

## 2023-09-12 NOTE — Progress Notes (Signed)
Patient extubated to comfort care per family request and MD orders at 1835.  Fentanyl started prior to extubation.  Family and chaplain at bedside.

## 2023-09-12 NOTE — Progress Notes (Signed)
eLink Physician-Brief Progress Note Patient Name: Carol Rowe DOB: 1956/05/20 MRN: [id number]   Date of Service  09/26/2023  HPI/Events of Note  67 year old woman with cirrhosis admitted with septic shock, positive COVID test 11/23 found to have ESBL E. coli bacteremia.  Currently mechanically ventilated on dual pressor support.   New ST vs afib into the 120s   eICU Interventions  Repeat albumin bolus     Intervention Category Intermediate Interventions: Arrhythmia - evaluation and management  Roselle Norton 10/01/2023, 2:06 AM

## 2023-09-12 NOTE — Progress Notes (Signed)
  Interdisciplinary Goals of Care Family Meeting   Date carried out:: 09/21/2023  Location of the meeting: Unit  Member's involved: Physician and Family Member or next of kin  Durable Power of Attorney or Environmental health practitioner: son    Discussion: We discussed goals of care for [employer] .  Family expressed that she would never have wanted life support.  She had expressed her desire to be DNR at the nursing home.  She would not accept worse quality of life.  She was "suffering" even in her premorbid state.  We discussed options of giving her 2-3 more days to see if hepatic encephalopathy would improve and if she would have significant progress with weaning from the ventilator.  They felt that we had put her through enough and chose to withdraw life support.  Comfort care orders written  Code status: Full DNR  Disposition: In-patient comfort care   Time spent for the meeting: 28 m  Chrissi Crow V. Arisbeth Purrington 10/09/2023, 6:07 PM

## 2023-09-12 NOTE — Progress Notes (Signed)
I responded to a page from the nurse to provide spiritual support for the patient's family. The patient was placed on comfort care. I arrived at the patient's room where her sister and sister-in-law were present. I provided spiritual support through pastoral presence, by reading scripture, sharing words of encouragement, and leading in prayer.    09/26/2023 1921  Spiritual Encounters  Type of Visit Initial  Care provided to: Pt and family  Conversation partners present during encounter Nurse  Referral source Nurse (RN/NT/LPN)  Reason for visit Urgent spiritual support  OnCall Visit Yes  Interventions  Spiritual Care Interventions Made Compassionate presence;Prayer;Encouragement    Chaplain Dr Melvyn Novas

## 2023-09-13 DIAGNOSIS — R34 Anuria and oliguria: Secondary | ICD-10-CM | POA: Insufficient documentation

## 2023-09-13 DIAGNOSIS — D696 Thrombocytopenia, unspecified: Secondary | ICD-10-CM | POA: Insufficient documentation

## 2023-09-13 DIAGNOSIS — J1282 Pneumonia due to coronavirus disease 2019: Secondary | ICD-10-CM | POA: Insufficient documentation

## 2023-09-14 LAB — CULTURE, BLOOD (ROUTINE X 2)

## 2023-10-03 DIAGNOSIS — J449 Chronic obstructive pulmonary disease, unspecified: Secondary | ICD-10-CM | POA: Diagnosis not present

## 2023-10-03 DIAGNOSIS — Z9181 History of falling: Secondary | ICD-10-CM | POA: Diagnosis not present

## 2023-10-03 DIAGNOSIS — N178 Other acute kidney failure: Secondary | ICD-10-CM | POA: Diagnosis not present

## 2023-10-03 DIAGNOSIS — R2689 Other abnormalities of gait and mobility: Secondary | ICD-10-CM | POA: Diagnosis not present

## 2023-10-03 DIAGNOSIS — R531 Weakness: Secondary | ICD-10-CM | POA: Diagnosis not present

## 2023-10-03 DIAGNOSIS — R6521 Severe sepsis with septic shock: Secondary | ICD-10-CM | POA: Diagnosis not present

## 2023-10-03 DIAGNOSIS — R262 Difficulty in walking, not elsewhere classified: Secondary | ICD-10-CM | POA: Diagnosis not present

## 2023-10-03 DIAGNOSIS — M6281 Muscle weakness (generalized): Secondary | ICD-10-CM | POA: Diagnosis not present

## 2023-10-04 ENCOUNTER — Ambulatory Visit (INDEPENDENT_AMBULATORY_CARE_PROVIDER_SITE_OTHER): Payer: BC Managed Care – PPO | Admitting: Gastroenterology

## 2023-10-10 NOTE — Death Summary Note (Signed)
PCCM Death Summary   Name: Carol Rowe MRN: [id number] DOB: December 24, 1955 68 y.o.  Date of Admission: 09/07/2023  1:20 PM Date of Discharge: 09/15/2023 Attending Physician: No att. providers found  Discharge Diagnosis: Principal Problem:   Septic shock (HCC) Active Problems:   Sepsis (HCC)   AKI (acute kidney injury) (HCC)   Acute respiratory failure with hypoxia (HCC)   UTI due to extended-spectrum beta lactamase (ESBL) producing Escherichia coli   Acute metabolic encephalopathy   Oliguria   Thrombocytopenia (HCC)   Pneumonia due to COVID-19 virus   Cause of death: Septic Shock secondary to ESBL E. coli bacteremia  Time of death: 2309/10/12, 10/09/23  Disposition and follow-up:   [name] was discharged from [facility] in expired condition.    Hospital Course: Septic Shock 2/2 ESBL producing E. Coli 2/2 UTI Acute metabolic encephalopathy  Patient presented to [facility] with generalized weakness and laboratory workup was significant for leukocytosis of 55K, increasing lactic acid, and an INR of 3.7. She was admitted to the ICU and intubated due to acute respiratory failure and for airway protection due to patient's mentation. She was started on vasopressors and stress dose steroids. Initially she was started on broad spectrum ABX but was transitioned to meropenem once blood cultures grew ESBL E coli thought to be due to an urinary source. She received meropenem from 11/30 to 10-09-23. Patient was weaned off of vasopressors as well as stress dose steroids. The sedation was discontinued on 12/02; however, the patient remained unresponsive. The acute metabolic encephalopathy was possibly due to hepatic encephalopathy with her history of cirrhosis vs decreased metabolism of the sedatives due to liver and renal impairments. On 2023-10-09, the family decided to transition to comfort care and the patient died on October 09, 2023 at 10-12-2309.   Acute Hypoxic Respiratory Failure 2/2 COVID  pneumonia  Patient tested positive for COVID at home on 09/01/2023. CXR revealed bilateral pleural effusions and opacities. She was intubated on 11/30 and was tolerating pressure support for a few hours each day during our attempts to wean her from the ventilator. She was extubated on 10/09/2023 during the transition to comfort care.    AKI Oliguria  AGMA, lactic acidosis  The patient presented with an AKI and lactic acidosis due to septic shock 2/2 ESBL E.coli. her serum creatine gradually worsened to 2.67 and her urine output continued to decrease. Patient did not receive HD or CRRT during this admission, it was not indicated and family did not consent regardless.   Thrombocytopenia Coagulopathy  Patient's thrombocytopenia gradually worsened due to sepsis as well as liver cirrhosis. Her coagulopathy improved from an INR of 4.7 to an INR of 2.3.     SignedFaith Rogue, DO 09/23/2023, 11:47 AM

## 2023-10-10 DEATH — deceased

## 2023-10-16 ENCOUNTER — Ambulatory Visit (INDEPENDENT_AMBULATORY_CARE_PROVIDER_SITE_OTHER): Payer: BC Managed Care – PPO | Admitting: Gastroenterology
